# Patient Record
Sex: Female | Born: 1950 | Race: White | Hispanic: No | Smoking: Former smoker
Health system: Southern US, Community
[De-identification: ages and names within clinical notes are randomized; demographics above are authoritative.]

## PROBLEM LIST (undated history)

## (undated) DIAGNOSIS — C50919 Malignant neoplasm of unspecified site of unspecified female breast: Secondary | ICD-10-CM

## (undated) DIAGNOSIS — S22000A Wedge compression fracture of unspecified thoracic vertebra, initial encounter for closed fracture: Secondary | ICD-10-CM

## (undated) DIAGNOSIS — C801 Malignant (primary) neoplasm, unspecified: Secondary | ICD-10-CM

## (undated) DIAGNOSIS — I739 Peripheral vascular disease, unspecified: Secondary | ICD-10-CM

## (undated) DIAGNOSIS — E7800 Pure hypercholesterolemia, unspecified: Secondary | ICD-10-CM

## (undated) DIAGNOSIS — M1990 Unspecified osteoarthritis, unspecified site: Secondary | ICD-10-CM

## (undated) DIAGNOSIS — G4733 Obstructive sleep apnea (adult) (pediatric): Secondary | ICD-10-CM

## (undated) DIAGNOSIS — I209 Angina pectoris, unspecified: Secondary | ICD-10-CM

## (undated) DIAGNOSIS — K219 Gastro-esophageal reflux disease without esophagitis: Secondary | ICD-10-CM

## (undated) DIAGNOSIS — I2510 Atherosclerotic heart disease of native coronary artery without angina pectoris: Secondary | ICD-10-CM

## (undated) DIAGNOSIS — G620 Drug-induced polyneuropathy: Secondary | ICD-10-CM

## (undated) DIAGNOSIS — I779 Disorder of arteries and arterioles, unspecified: Secondary | ICD-10-CM

## (undated) DIAGNOSIS — J439 Emphysema, unspecified: Secondary | ICD-10-CM

## (undated) DIAGNOSIS — I10 Essential (primary) hypertension: Secondary | ICD-10-CM

## (undated) DIAGNOSIS — Z9989 Dependence on other enabling machines and devices: Secondary | ICD-10-CM

## (undated) HISTORY — PX: CARDIAC SURGERY: SHX584

## (undated) HISTORY — DX: Peripheral vascular disease, unspecified: I73.9

## (undated) HISTORY — PX: THYROID SURGERY: SHX805

## (undated) HISTORY — PX: APPENDECTOMY: SHX54

## (undated) HISTORY — PX: BREAST SURGERY: SHX581

## (undated) HISTORY — DX: Disorder of arteries and arterioles, unspecified: I77.9

## (undated) HISTORY — PX: CHOLECYSTECTOMY: SHX55

## (undated) HISTORY — PX: TUBAL LIGATION: SHX77

---

## 2000-11-07 ENCOUNTER — Encounter: Payer: Self-pay | Admitting: Emergency Medicine

## 2000-11-08 ENCOUNTER — Inpatient Hospital Stay (HOSPITAL_COMMUNITY): Admission: EM | Admit: 2000-11-08 | Discharge: 2000-11-09 | Payer: Self-pay | Admitting: Emergency Medicine

## 2003-04-13 DIAGNOSIS — C50919 Malignant neoplasm of unspecified site of unspecified female breast: Secondary | ICD-10-CM

## 2003-04-13 HISTORY — DX: Malignant neoplasm of unspecified site of unspecified female breast: C50.919

## 2003-04-13 HISTORY — PX: MASTECTOMY: SHX3

## 2003-10-11 DIAGNOSIS — C801 Malignant (primary) neoplasm, unspecified: Secondary | ICD-10-CM

## 2003-10-11 HISTORY — DX: Malignant (primary) neoplasm, unspecified: C80.1

## 2003-10-25 ENCOUNTER — Other Ambulatory Visit: Payer: Self-pay

## 2003-11-20 ENCOUNTER — Other Ambulatory Visit: Payer: Self-pay

## 2004-01-11 ENCOUNTER — Ambulatory Visit: Payer: Self-pay | Admitting: Oncology

## 2004-02-11 ENCOUNTER — Ambulatory Visit: Payer: Self-pay | Admitting: Oncology

## 2004-03-12 ENCOUNTER — Ambulatory Visit: Payer: Self-pay | Admitting: Oncology

## 2004-04-12 ENCOUNTER — Ambulatory Visit: Payer: Self-pay | Admitting: Oncology

## 2004-05-13 ENCOUNTER — Ambulatory Visit: Payer: Self-pay | Admitting: Oncology

## 2004-06-10 ENCOUNTER — Ambulatory Visit: Payer: Self-pay | Admitting: Oncology

## 2004-07-11 ENCOUNTER — Ambulatory Visit: Payer: Self-pay | Admitting: Oncology

## 2004-09-14 ENCOUNTER — Ambulatory Visit: Payer: Self-pay | Admitting: Oncology

## 2004-10-10 ENCOUNTER — Ambulatory Visit: Payer: Self-pay | Admitting: Oncology

## 2004-12-02 ENCOUNTER — Ambulatory Visit: Payer: Self-pay | Admitting: Oncology

## 2004-12-09 ENCOUNTER — Ambulatory Visit: Payer: Self-pay | Admitting: Oncology

## 2004-12-11 ENCOUNTER — Ambulatory Visit: Payer: Self-pay | Admitting: Oncology

## 2005-01-10 ENCOUNTER — Ambulatory Visit: Payer: Self-pay | Admitting: Oncology

## 2005-03-09 ENCOUNTER — Ambulatory Visit: Payer: Self-pay | Admitting: Oncology

## 2005-03-12 ENCOUNTER — Ambulatory Visit: Payer: Self-pay | Admitting: Oncology

## 2005-03-25 ENCOUNTER — Ambulatory Visit: Payer: Self-pay | Admitting: Otolaryngology

## 2005-04-06 ENCOUNTER — Encounter: Payer: Self-pay | Admitting: Otolaryngology

## 2005-04-15 ENCOUNTER — Ambulatory Visit: Payer: Self-pay | Admitting: Otolaryngology

## 2005-06-07 ENCOUNTER — Ambulatory Visit: Payer: Self-pay | Admitting: Oncology

## 2005-06-10 ENCOUNTER — Ambulatory Visit: Payer: Self-pay | Admitting: Oncology

## 2005-09-09 ENCOUNTER — Ambulatory Visit: Payer: Self-pay | Admitting: Oncology

## 2005-12-09 ENCOUNTER — Ambulatory Visit: Payer: Self-pay | Admitting: Oncology

## 2005-12-16 ENCOUNTER — Ambulatory Visit: Payer: Self-pay | Admitting: Oncology

## 2006-01-10 ENCOUNTER — Ambulatory Visit: Payer: Self-pay | Admitting: Oncology

## 2006-03-18 ENCOUNTER — Ambulatory Visit: Payer: Self-pay | Admitting: Oncology

## 2006-04-14 ENCOUNTER — Ambulatory Visit: Payer: Self-pay | Admitting: Oncology

## 2006-05-13 ENCOUNTER — Ambulatory Visit: Payer: Self-pay | Admitting: Oncology

## 2006-08-11 ENCOUNTER — Ambulatory Visit: Payer: Self-pay | Admitting: Oncology

## 2006-09-11 ENCOUNTER — Ambulatory Visit: Payer: Self-pay | Admitting: Oncology

## 2006-12-08 ENCOUNTER — Ambulatory Visit: Payer: Self-pay | Admitting: Surgery

## 2006-12-08 ENCOUNTER — Other Ambulatory Visit: Payer: Self-pay

## 2006-12-12 ENCOUNTER — Ambulatory Visit: Payer: Self-pay | Admitting: Oncology

## 2006-12-15 ENCOUNTER — Ambulatory Visit: Payer: Self-pay | Admitting: Surgery

## 2006-12-29 ENCOUNTER — Ambulatory Visit: Payer: Self-pay | Admitting: Oncology

## 2007-01-11 ENCOUNTER — Ambulatory Visit: Payer: Self-pay | Admitting: Oncology

## 2007-04-13 HISTORY — PX: CORONARY ANGIOPLASTY: SHX604

## 2007-05-14 ENCOUNTER — Ambulatory Visit: Payer: Self-pay | Admitting: Oncology

## 2007-06-11 ENCOUNTER — Ambulatory Visit: Payer: Self-pay | Admitting: Oncology

## 2007-07-12 ENCOUNTER — Ambulatory Visit: Payer: Self-pay | Admitting: Oncology

## 2007-09-11 ENCOUNTER — Ambulatory Visit: Payer: Self-pay | Admitting: Oncology

## 2007-09-21 ENCOUNTER — Ambulatory Visit: Payer: Self-pay | Admitting: Oncology

## 2007-10-05 ENCOUNTER — Ambulatory Visit: Payer: Self-pay | Admitting: Oncology

## 2007-10-19 ENCOUNTER — Inpatient Hospital Stay (HOSPITAL_COMMUNITY): Admission: RE | Admit: 2007-10-19 | Discharge: 2007-10-21 | Payer: Self-pay | Admitting: *Deleted

## 2007-11-11 ENCOUNTER — Ambulatory Visit: Payer: Self-pay | Admitting: Oncology

## 2007-11-30 ENCOUNTER — Ambulatory Visit: Payer: Self-pay | Admitting: Oncology

## 2007-12-12 ENCOUNTER — Ambulatory Visit: Payer: Self-pay | Admitting: Oncology

## 2008-04-12 HISTORY — PX: CARDIAC CATHETERIZATION: SHX172

## 2008-05-13 ENCOUNTER — Ambulatory Visit: Payer: Self-pay | Admitting: Oncology

## 2008-05-27 ENCOUNTER — Ambulatory Visit: Payer: Self-pay | Admitting: Oncology

## 2008-06-10 ENCOUNTER — Ambulatory Visit: Payer: Self-pay | Admitting: Oncology

## 2008-09-23 ENCOUNTER — Ambulatory Visit: Payer: Self-pay | Admitting: Oncology

## 2008-11-10 ENCOUNTER — Ambulatory Visit: Payer: Self-pay | Admitting: Oncology

## 2008-11-25 ENCOUNTER — Ambulatory Visit: Payer: Self-pay | Admitting: Oncology

## 2008-12-11 ENCOUNTER — Ambulatory Visit: Payer: Self-pay | Admitting: Oncology

## 2009-02-05 ENCOUNTER — Inpatient Hospital Stay (HOSPITAL_BASED_OUTPATIENT_CLINIC_OR_DEPARTMENT_OTHER): Admission: RE | Admit: 2009-02-05 | Discharge: 2009-02-05 | Payer: Self-pay | Admitting: Cardiovascular Disease

## 2009-05-13 ENCOUNTER — Ambulatory Visit: Payer: Self-pay | Admitting: Oncology

## 2009-05-26 DIAGNOSIS — I2510 Atherosclerotic heart disease of native coronary artery without angina pectoris: Secondary | ICD-10-CM | POA: Insufficient documentation

## 2009-06-02 ENCOUNTER — Ambulatory Visit: Payer: Self-pay | Admitting: Oncology

## 2009-06-10 ENCOUNTER — Ambulatory Visit: Payer: Self-pay | Admitting: Oncology

## 2009-07-21 DIAGNOSIS — B351 Tinea unguium: Secondary | ICD-10-CM | POA: Insufficient documentation

## 2009-08-25 DIAGNOSIS — J4489 Other specified chronic obstructive pulmonary disease: Secondary | ICD-10-CM | POA: Insufficient documentation

## 2009-08-26 ENCOUNTER — Encounter: Admission: RE | Admit: 2009-08-26 | Discharge: 2009-08-26 | Payer: Self-pay | Admitting: Family Medicine

## 2009-09-29 ENCOUNTER — Ambulatory Visit: Payer: Self-pay | Admitting: Oncology

## 2009-10-20 HISTORY — PX: OTHER SURGICAL HISTORY: SHX169

## 2009-11-07 ENCOUNTER — Encounter: Payer: Self-pay | Admitting: Oncology

## 2009-11-10 ENCOUNTER — Encounter: Payer: Self-pay | Admitting: Oncology

## 2009-12-11 ENCOUNTER — Encounter: Payer: Self-pay | Admitting: Oncology

## 2010-01-10 ENCOUNTER — Encounter: Payer: Self-pay | Admitting: Oncology

## 2010-06-19 ENCOUNTER — Inpatient Hospital Stay (INDEPENDENT_AMBULATORY_CARE_PROVIDER_SITE_OTHER)
Admission: RE | Admit: 2010-06-19 | Discharge: 2010-06-19 | Disposition: A | Discharge status: Home / Self Care | Payer: Worker's Compensation | Source: Ambulatory Visit | Attending: Emergency Medicine | Admitting: Emergency Medicine

## 2010-06-19 ENCOUNTER — Ambulatory Visit (INDEPENDENT_AMBULATORY_CARE_PROVIDER_SITE_OTHER): Payer: Worker's Compensation

## 2010-06-19 DIAGNOSIS — S6720XA Crushing injury of unspecified hand, initial encounter: Secondary | ICD-10-CM

## 2010-07-02 HISTORY — PX: NM MYOCAR PERF WALL MOTION: HXRAD629

## 2010-07-16 LAB — POCT I-STAT GLUCOSE
Glucose, Bld: 174 mg/dL — ABNORMAL HIGH (ref 70–99)
Operator id: 221371

## 2010-08-25 NOTE — Cardiovascular Report (Signed)
NAME:  Michaela Wilcox, FUCHS NO.:  [id number]   MEDICAL RECORD NO.:  [id number]          PATIENT TYPE:  OIB   LOCATION:  6532                         FACILITY:  [facility]   PHYSICIAN:  Peter M. Swaziland, M.D.  DATE OF BIRTH:  [birth year]   DATE OF PROCEDURE:  DATE OF DISCHARGE:                            CARDIAC CATHETERIZATION   INDICATIONS FOR PROCEDURE:  A 60 year old white female who presents with  progressive symptoms of angina.  Diagnostic cardiac catheterization  demonstrated three-vessel obstructive coronary disease.  She has severe  90% stenosis in the LAD with a 70% to 80% stenosis in the mid circumflex  and diffuse 70% stenosis involving the proximal right coronary artery.  After weighing her options, the patient elected to pursue catheter-based  intervention.   PROCEDURE:  Intracoronary stenting of the mid LAD, and of the mid left  circumflex coronary.  Access via the right femoral artery using standard  Seldinger technique.   EQUIPMENT USED:  6-French arterial sheath, 6-French FL-4 guide, a 0.014  Asahi medium wire, a 2.5 x 15-mm Sprinter Legend balloon, a 2.75 x 18-mm  Promus stent, 2.75 x 15-mm [place] Sprinter noncompliant balloon, 2.25 x 12-  mm Taxus Liberte Atom stent and a 2.25 x 8-mm Quantum Maverick balloon.   MEDICATIONS:  Versed 2 mg IV, fentanyl 25 mcg IV, nitroglycerin 200 mcg  intracoronary x3, Angiomax bolus at 0.75 mg/kg followed by continuous  infusion 1.75 mg/kg per hour.  ACT following this was 526 seconds.  Angiomax was discontinued at the end of the procedure.  The patient was  hemodynamically stable throughout the procedure.  She did have mild  chest pain with balloon inflations.   INTERVENTIONAL NOTE:  After initial guide shots were obtained, we  proceeded with stenting of the mid left anterior descending artery.  This was easily crossed with a wire.  We predilated the lesion using a  2.5 x 15-mm Sprinter Legend balloon up to 6 and then 8  atmospheres.  We  next placed a 2.75 x 80-mm Promus stent across the lesion.  This was  deployed at 9 and then 11 atmospheres with a stent balloon.  We then  postdilated using a 2.75 x 15-mm [place] Sprinter balloon up to 14  atmospheres x2.  This yielded an excellent angiographic result with 0%  residual stenosis and TIMI grade III flow.   We next turned our attention to the mid left circumflex coronary using  the same guide and guide wire.  We were able to cross this lesion  without difficulty.  We primarily stented this lesion with 2.25 x 12-mm  Taxus Atom stent deploying this at 9 and then 12 atmospheres with the  stent balloon.  It was postdilated using a 2.25 x 8-mm Quantum Maverick  balloon up to 16 atmospheres x2.  This yielded an excellent angiographic  result with 0% residual stenosis and TIMI grade III flow.   FINAL INTERPRETATION:  Successful stenting of the mid left anterior  descending artery and mid left circumflex coronary artery with drug-  eluting stents.   PLAN:  Continue aspirin and Plavix.  If the patient remained  symptomatic, we would consider intervention of the proximal right  coronary artery at a later date.           ______________________________  Peter M. Swaziland, M.D.     PMJ/MEDQ  D:  10/20/2007  T:  10/21/2007  Job:  [id number]   cc:   Elmore Guise., M.D.

## 2010-08-25 NOTE — H&P (Signed)
NAME:  Michaela, Wilcox                    ACCOUNT NO.:  [id number]   MEDICAL RECORD NO.:  [id number]        PATIENT TYPE:  COIB   LOCATION:                               FACILITY:  [facility]   PHYSICIAN:  Elmore Guise., M.D.DATE OF BIRTH:  08/09/1950   DATE OF ADMISSION:  10/19/2007  DATE OF DISCHARGE:                              HISTORY & PHYSICAL   INDICATION FOR PROCEDURE:  Continued exertional chest pain, shortness of  breath.   HISTORY OF PRESENT ILLNESS:  [name] is a very pleasant 60 year old  white female with past medical history of coronary vasospasm, breast  cancer, hypothyroidism, hypertension, dyslipidemia, tobacco dependence  who initially presented for chest pain on October 05, 2007.  She reported  at that time over the last 6 months, she was having an abnormal feeling  in her upper chest.  She first thought it was indigestion and secondary  to postnasal drip, however, her symptoms progressed, now having chest  pain in her upper chest with moderate activity.  It makes her very short  of breath.  She denies any nausea or diaphoresis.  She reports over the  last month or two, she has been having radiation to her neck and both  shoulders.  She has had no rest pain.  She would sit down and it will  go away.  She had similar episodes back in 2002.  At that time, she  underwent cardiac catheterization by Dr. Viann Fish.  This showed an  EF of 60% with normal left main, normal LAD, normal circumflex.  RCA had  significant vasospasm noted.  She has done rather well for the last 6-7  years until her spell started about 6 months ago.  She has been smoking  for 22 years and recently placed on Chantix, but reports that this  caused her to be depressed.  She does report that she has chronic left  pain from her mastectomy which radiates to her left arm and states that  this is hard to differentiate from her chest pain or her cancer pain.  She denies any significant fever, orthopnea,  PND.  She was placed on  long-acting Imdur starting at 30 mg and increasing it up to 60 mg.  She  reports she is 50-60% better.  She does continue to be very winded.  She  can walk from the parking lot to the office and will get very short of  breath.  Her chest pain has improved, but still occurs with any moderate  activity such as walking up a slight incline or with climbing stairs.  She will be for referred for cardiac catheterization.   CURRENT MEDICATIONS:  1. Pravachol 80 mg daily.  2. Boniva 150 mg monthly.  3. Calcium with vitamin D once daily.  4. Levothyroxine 100 mcg daily.  5. Lyrica 75 mg daily.  6. Vitamin D 1000 units daily.  7. Imdur 60 mg daily.  8. Aspirin 325 mg daily.   ALLERGIES:  NONE.   FAMILY HISTORY:  Positive for diabetes mellitus.  The patient was  adopted.  She does note that her father died from cancer.  Mother died  from complications of hip fracture surgery.   SOCIAL HISTORY:  She is divorced.  She works for the [employer].  Does not exercise.  Has 45 pack-year history of tobacco.  She drinks 4  cups of coffee and 2-3 bottles of green tea daily.   PHYSICAL EXAMINATION:  VITAL SIGNS:  Her weight is 201 pounds.  Her  blood pressure is 150/100, heart rate is 72 and regular.  GENERAL:  She is a very pleasant middle-aged white female alert and  oriented x4.  No acute distress.  HEENT:  Normal.  NECK:  Supple.  No lymphadenopathy, 2+ carotids.  No JVD and no bruits.  She does have an old scar at the bottom of her neck from her thyroid and  parathyroid surgery.  LUNGS:  Clear.  HEART:  Regular with normal S1-S2.  Soft flow murmur noted.  BREASTS:  She has had a left breast mastectomy.  ABDOMEN:  Soft, nontender, nondistended.  No rebound or guarding.  EXTREMITIES:  Warm, 2+ pulses and no edema.  She has 2+ pulses in her  brachial, radial and pedal.  Femoral pulses are 1+ and deep bilaterally.   DIAGNOSTICS:  Her EKG shows sinus bradycardia  with anterior T-wave  inversions V1-V3.  We have no old EKG for comparison.   IMPRESSION:  1. Continued exertional chest pain.  2. History of coronary vasospasm.  3. Dyslipidemia.  4. Hypertension.  5. History of breast cancer.   PLAN:  At this time, we will increase her Imdur.  She will continue 60  mg in the morning and 30 mg in the afternoon.  She will continue aspirin  and use sublingual nitroglycerin as needed.  I discussed risks, benefits  of cardiac catheterization versus stress testing.  We both agree with  her continued symptoms despite being on long-acting nitrates, that we  would refer her for catheterization.  She understands the risks and  benefits of this procedure, and agrees to proceed.  She is to call if  she has any worsening symptoms prior to her cath.  All her questions  were answered.      Elmore Guise., M.D.  Electronically Signed     TWK/MEDQ  D:  10/17/2007  T:  10/17/2007  Job:  [id number]

## 2010-08-28 NOTE — Cardiovascular Report (Signed)
[place]. [facility]  Patient:    Michaela Wilcox, Michaela Wilcox                           MRN: [id number] Proc. Date: 11/08/00 Adm. Date:  [id number] Attending:  Norman Clay CC:         Meindert A. Lacie Scotts, M.D., [facility]   Cardiac Catheterization  PROCEDURE:  Cardiac catheterization.  HISTORY:  A 60 year old female with cigarette abuse, hyperlipidemia, history of gastroesophageal reflux disease, who presented with a prolonged episode of chest pain relieved with nitroglycerin.  The pain had increased in frequency and severity, and she had a Cardiolite scan done recently.  The patient tolerated the procedure well without complications.  At the end of the procedure, the patient had good hemostasis and pedal pulses present.  CARDIOLOGIST:  Darden Palmer., M.D.  HEMODYNAMIC DATA:  Aorta post contrast 145/76, LV post contrast 145/15.  ANGIOGRAPHIC DATA:  Left ventriculogram:  Performed in the 30 degree RAO projection.  The aortic and mitral valves appear normal.  The left ventricle is normal in size.  There was normal wall motion and normal ejection fraction of 60%.  Coronary arteries arise and distribute normally.  The left main coronary artery had significant ventricular motion and damping with a 6-French JL4 catheter.  With a 5-French JL3.5 catheter, there wss no significant ventricularization or damping.  There did not appear to be angiographic narrowing through the left main coronary artery noted.  The vessel did appear to be somewhat small, however.  The left anterior descending was a large vessel proximally without significant stenosis.  There was irregularity in the mid and distal portions.  The circumflex had proximal irregularity but no severe obstructive stenoses noted.  The right coronary artery had significant ventricularization and damping with a JR4 6-French catheter.  A right bypass catheter gave better visualization, but  there appeared to be an obstructive stenosis proximally.  After the left coronary injections, we went back with a 6-French JR3.5 catheter which had no ventricularization or damping and showed the vessel to be widely patent.  There was mild irregularity proximally suggesting the initial stenoses seen were spasm related.  IMPRESSION: 1. Normal left ventricular function. 2. Mild irregularities involving the left anterior descending coronary artery. 3. Coronary spasm, catheter induced, involving the right coronary artery    proximally.  There is mild disease in the mid right coronary artery also. 4. The left main coronary artery had ventricularization with the 6-French    catheter but not the 5-French catheter and did not appear to have any    angiographic obstructions. DD:  11/08/00 TD:  11/08/00 Job: [id number]

## 2010-08-28 NOTE — Discharge Summary (Signed)
NAME:  Michaela Wilcox, Michaela Wilcox                    ACCOUNT NO.:  [id number]   MEDICAL RECORD NO.:  [id number]          PATIENT TYPE:  OIB   LOCATION:  6532                         FACILITY:  [facility]   PHYSICIAN:  Elmore Guise., M.D.DATE OF BIRTH:  04-15-1950   DATE OF ADMISSION:  10/19/2007  DATE OF DISCHARGE:  10/21/2007                               DISCHARGE SUMMARY   DISCHARGE DIAGNOSES:  1. Coronary artery disease (status post successful PCI to mid LAD and      mid circumflex).  2. Dyslipidemia.  3. Hypothyroidism.  4. Breast cancer  5. Tobacco dependence.   HISTORY OF PRESENT ILLNESS:  Michaela Wilcox is a 60 year old who presented  with increasing exertional angina.  She was referred for cardiac  catheterization.   HOSPITAL COURSE:  The patient's hospital course was uncomplicated.  She  underwent diagnostic catheterization on October 19, 2007.  This showed focal  stenosis in her LAD, circumflex, and RCA.  After her procedure a long  discussion was had regarding PCI versus surgical referral.  The patient  was elected for PCI.  Was very hesitant to have surgical  revascularizations.  She was referred to Dr. Swaziland.  He discussed the  risks, benefits of this procedure with her at length.  She then  underwent successful PCI on October 20, 2007.  Her postprocedure course was  uncomplicated.  She did have a vagal reaction after her sheath was  pulled which responded nicely to normal saline bolus.  She was noted to  have a very small hematoma, but this resolved with direct pressure.  She  has now been up and ambulatory.  She has had no further chest pain or  shortness of breath.  She will be discharged home today to continue the  following medications:   DISCHARGE MEDICATIONS:  1. Lipitor 40 mg daily.  2. Boniva 150 mg monthly.  3. Calcium with vitamin D once daily.  4. Synthroid 100 mcg daily.  5. Lyrica 75 mg daily.  6. Vitamin D 1000 units daily.  7. Aspirin 325 mg daily.  8. Sublingual  nitroglycerin as needed for chest pain.  9. Plavix 75 mg daily.  10.Percocet 5/325 mg 1-2 as needed for pain.  11.She did become bradycardic and hypotensive with metoprolol.      Therefore metoprolol will not be given at discharge.  She should      not need her Imdur at this point.   FOLLOWUP:  She will follow up with Dr. Reyes Ivan in one week.  She should  have routine post cath restrictions.   ACTIVITY:  She is to increase her activity slowly.      Elmore Guise., M.D.  Electronically Signed     TWK/MEDQ  D:  10/23/2007  T:  10/23/2007  Job:  [id number]

## 2010-08-28 NOTE — Discharge Summary (Signed)
[place]. [facility]  Patient:    Michaela Wilcox, Michaela Wilcox                           MRN: [id number] Adm. Date:  [id number] Disc. Date: [id number] Attending:  Norman Clay CC:         Meindert A. Lacie Scotts, M.D., [place], [place]   Discharge Summary  FINAL DIAGNOSES: 1. Chest pain, prolonged, of uncertain etiology, myocardial infarction ruled    out.    a. Catheterization showing mild coronary artery disease.  No severe       obstructive stenoses noted. 2. Hyperlipidemia inadequately treated at the present time. 3. History of esophageal ulcers. 4. Dizziness of uncertain etiology. 5. Obesity. 6. Cigarette abuse.  HISTORY OF PRESENT ILLNESS:  This 60 year old female has a longstanding history of cigarette abuse and hyperlipidemia.  She has a six-month history of chest tightness and fluttering and also has symptoms of dizziness and dyspnea. She has had progressive symptoms sometimes with exertion and sometimes not which gradually limited her activities.  She has gained weight.  She had an echocardiogram done at through Dr. Chyrl Civatte A. Niemeyers referral at [facility] office and was told that she had ventricular hypertrophy.  She had a stress Cardiolite scan done several days prior to admission by Dr. Welton Flakes at [place], [place].  The scan was faxed over to our office and showed what was felt to be breast attenuation without ischemia.  She presented to the emergency room with anterior chest pain and pressure lasting around 30 minutes associated with shortness of breath and sweating.  She felt as if there was a hand pressing on her chest.  EMS noted that the pain lasted two hours.  This was accompanied with shortness of breath and nausea.  She also complained of dizziness at the time she was having chest pain and described the dizziness as unsteadiness on her feet rather than vertigo.  She was complaining of dizziness the morning after she was  admitted.  Please see the previously dictated history and physical for the remainder of the details.  LABORATORY DATA ON ADMISSION:  Negative serial CK-MB.  The EKG remained normal.  The glucose was 117, which was slightly elevated.  The CBC was normal.  The lipid panel showed a cholesterol of 240, triglycerides 253, HDL 40, and LDL cholesterol 149.  The TSH was 1.413.  HOSPITAL COURSE:  The patient was brought into the hospital with a prolonged episode of chest discomfort.  She underwent cardiac catheterization the next afternoon.  The right coronary artery contained mild irregularities.  There was thought to be some catheter-induced spasm that resolved spontaneously. The left main coronary artery had to be imaged with 5 French catheters.  It appeared to be somewhat small, but did not contain severe obstructive disease angiographically.  There were irregularities in the LAD and the circumflex noted.  The results were discussed with the patient.  After review of the non-invasive data from Dr. Hulan Fess office, it was felt that she should be treated medically.  She day of discharge have elevated LDL cholesterol and low HDL cholesterol at 40 for a woman and it is recommended that she have intensive lipid-lowering management to lower the LDL to less than 100.  The importance of discontinuation of cigarette smoker was discussed with her, as well as specific plan means of ways to do so.  She will be treated with sustained  release diltiazem 240 mg daily to prevent esophageal spasm or coronary spasms as she is at risk of this with cigarette smoking.  DISCHARGE MEDICATIONS:  She is discharged at this time on her previous dose of Pravachol, Prevacid 30 mg daily, sustained released diltiazem 240 mg daily, and baby aspirin daily.  She was also given a prescription for nitroglycerin to use for symptoms.  FOLLOW-UP:  She is to follow up with me in two weeks.  It is recommended that she have a  gastroenterologic evaluation. DD:  11/09/00 TD:  11/09/00 Job: [id number]

## 2010-08-28 NOTE — H&P (Signed)
[place]. [facility]  Patient:    Michaela Wilcox, Michaela Wilcox                           MRN: [id number] Adm. Date:  [id number] Attending:  Cathren Laine CC:         Meindert A. Lacie Scotts, M.D., [place]   History and Physical  CHIEF COMPLAINT:  Chest pain.  HISTORY:  The patient is a 60 year old female with a longstanding history of cigarette abuse and hyperlipidemia.  She has had a six to eight-month history of chest tightness and fluttering and also symptoms of dizziness and shortness of breath.  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  She saw her family doctor around one week ago and had an echocardiogram and was told that she had an enlarged heart.  She had a stress Cardiolite scan by Dr. Welton Flakes in [place] around five days ago but has not received the results of it.  This evening, she developed dizziness described as an unsteadiness on her feet, felt warm and hot, and then developed anterior chest pain and pressure that she says lasted around 30 minutes.  The discomfort was associated with shortness of breath, sweating, and felt as if there was a hand pressing on her chest.  It was not pleuritic and radiated somewhat to the left arm.  The EMS note noted the pain lasted two hours and was described as shortness of breath and nausea.  There was no vomiting, and she had some mild diaphoresis.  She was given 4 aspirin and then given nitroglycerin with some partial relief of her pain, and then a second nitroglycerin with relief of the pain.  She was transported to [facility].  An initial ECG was unremarkable, and she is admitted at this time to rule out an MI.  PAST MEDICAL HISTORY:  Remarkable for hyperlipidemia treated with Pravachol. She has a history of esophageal ulcers treated with Prevacid but notes these symptoms are different from the  previous ones.  There was no history of hypertension or diabetes.  PAST SURGICAL HISTORY:  Breast biopsy, removal of cyst, appendectomy, previous endoscopy.  ALLERGIES:  None.  CURRENT MEDICATIONS:  Pravachol daily and Prevacid daily.  Aspirin daily.  FAMILY HISTORY:  She is adopted and does not know her immediate family history.  SOCIAL HISTORY:  She is divorced 22 years ago and has one son, 33, by previous marriage.  She smokes two packs of cigarettes per day, does not use alcohol to excess.  She does not attend church regularly.  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  REVIEW OF SYSTEMS:  She has been obese for several years, formerly worked out until chest pain and dizziness started.  She complains of dizziness described more as unsteadiness on her feet and not vertigo.  She does not have any pleuritic chest pain, denies PND or orthopnea.  She does have the discomfort described as reflux when she would swallow or eat things that is relieved with Prevacid.  She describes it more as a burning pain and different from the pain that she had tonight.  No claudication or TIAs.  Remainder of Review of Systems is unremarkable.  PHYSICAL EXAMINATION:  GENERAL:  She is a pale, obese female who appears older  than her stated age of 89.  VITAL SIGNS:  Blood pressure 130/85, pulse 76.  SKIN:  Warm and dry.  HEENT:  EOMI.  PERRLA.  Conjunctivae and sclerae clear.  NECK:  There is no JVD, thyromegaly, or bruits.  LUNGS:  Clear to auscultation and percussion.  CARDIAC:  Normal S1 and S2, no S3.  ABDOMEN:  Soft and nontender.  EXTREMITIES:  Femoral and distal pulses present and 2+.  No edema noted.  NEUROLOGIC:  Normal.  LABORATORY DATA:  A 12-lead ECG is normal.  Lab data shows normal CBC.  Potassium 3.5, glucose 117.  Initial CPK and troponin were negative.  IMPRESSION: 1. Chest pain consistent with  unstable angina pectoris. 2. Recent cardiac workup yet unknown.  Will try to obtain details tomorrow. 3. Hyperlipidemia under treatment. 4. History of gastroesophageal reflux. 5. Tobacco abuse.  RECOMMENDATIONS:  Admit to telemetry bed.  Begin intravenous heparin, beta blockers, and aspirin.  Check serial enzymes.  The patient, because of her suggestive history, will need to undergo cardiac catheterization to exclude coronary artery disease.  We will attempt to obtain records of the echocardiogram and Cardiolite done recently.  Even if the Cardiolite is negative, the acute presentation tonight would necessitate a cardiac catheterization for diagnosis.  The catheterization procedure was discussed with the patient and her son fully including risks of MI, death, or CVA; in addition, risks of angioplasty and stenting were discussed with the patient, and they are willing to proceed. DD:  11/08/00 TD:  11/08/00 Job: [id number]

## 2010-10-01 ENCOUNTER — Ambulatory Visit: Payer: Self-pay | Admitting: Family Medicine

## 2011-01-07 LAB — CBC
HCT: 37.2
HCT: 43.1
Hemoglobin: 12.8
MCHC: 34.3
MCV: 95.7
RBC: 3.91
RBC: 4.5
RDW: 13.6
WBC: 10.5

## 2011-01-07 LAB — BASIC METABOLIC PANEL
BUN: 11
CO2: 25
CO2: 27
Calcium: 9.6
Chloride: 103
Creatinine, Ser: 0.82
GFR calc Af Amer: >60
GFR calc non Af Amer: >60
GFR calc non Af Amer: >60
Glucose, Bld: 170 — ABNORMAL HIGH
Glucose, Bld: 287 — ABNORMAL HIGH
Potassium: 3.9
Potassium: 4
Sodium: 134 — ABNORMAL LOW
Sodium: 139

## 2011-03-30 ENCOUNTER — Emergency Department (HOSPITAL_COMMUNITY)
Admission: EM | Admit: 2011-03-30 | Discharge: 2011-03-30 | Disposition: A | Discharge status: Home / Self Care | Payer: 59 | Attending: Emergency Medicine | Admitting: Emergency Medicine

## 2011-03-30 ENCOUNTER — Other Ambulatory Visit: Payer: Self-pay

## 2011-03-30 ENCOUNTER — Encounter: Payer: Self-pay | Admitting: *Deleted

## 2011-03-30 ENCOUNTER — Emergency Department (HOSPITAL_COMMUNITY): Payer: 59

## 2011-03-30 DIAGNOSIS — E7800 Pure hypercholesterolemia, unspecified: Secondary | ICD-10-CM | POA: Insufficient documentation

## 2011-03-30 DIAGNOSIS — Z7982 Long term (current) use of aspirin: Secondary | ICD-10-CM | POA: Insufficient documentation

## 2011-03-30 DIAGNOSIS — I2510 Atherosclerotic heart disease of native coronary artery without angina pectoris: Secondary | ICD-10-CM | POA: Insufficient documentation

## 2011-03-30 DIAGNOSIS — Z859 Personal history of malignant neoplasm, unspecified: Secondary | ICD-10-CM | POA: Insufficient documentation

## 2011-03-30 DIAGNOSIS — F17200 Nicotine dependence, unspecified, uncomplicated: Secondary | ICD-10-CM | POA: Insufficient documentation

## 2011-03-30 DIAGNOSIS — R110 Nausea: Secondary | ICD-10-CM | POA: Insufficient documentation

## 2011-03-30 DIAGNOSIS — I10 Essential (primary) hypertension: Secondary | ICD-10-CM | POA: Insufficient documentation

## 2011-03-30 DIAGNOSIS — D72829 Elevated white blood cell count, unspecified: Secondary | ICD-10-CM | POA: Insufficient documentation

## 2011-03-30 DIAGNOSIS — R1013 Epigastric pain: Secondary | ICD-10-CM | POA: Insufficient documentation

## 2011-03-30 DIAGNOSIS — I491 Atrial premature depolarization: Secondary | ICD-10-CM | POA: Insufficient documentation

## 2011-03-30 HISTORY — DX: Atherosclerotic heart disease of native coronary artery without angina pectoris: I25.10

## 2011-03-30 HISTORY — DX: Pure hypercholesterolemia, unspecified: E78.00

## 2011-03-30 HISTORY — DX: Malignant (primary) neoplasm, unspecified: C80.1

## 2011-03-30 HISTORY — DX: Essential (primary) hypertension: I10

## 2011-03-30 LAB — BASIC METABOLIC PANEL
BUN: 11 mg/dL (ref 6–23)
Calcium: 10.5 mg/dL (ref 8.4–10.5)
Creatinine, Ser: 0.71 mg/dL (ref 0.50–1.10)
GFR calc Af Amer: >90 mL/min (ref >90)
GFR calc non Af Amer: >90 mL/min (ref >90)
Glucose, Bld: 100 mg/dL — ABNORMAL HIGH (ref 70–99)
Potassium: 4 mEq/L (ref 3.5–5.1)

## 2011-03-30 LAB — DIFFERENTIAL
Basophils Relative: 0 % (ref 0–1)
Eosinophils Absolute: 0.1 10*3/uL (ref 0.0–0.7)
Eosinophils Relative: 1 % (ref 0–5)
Monocytes Relative: 4 % (ref 3–12)
Neutrophils Relative %: 78 % — ABNORMAL HIGH (ref 43–77)

## 2011-03-30 LAB — CBC
Hemoglobin: 14.2 g/dL (ref 12.0–15.0)
MCH: 32.3 pg (ref 26.0–34.0)
MCHC: 33.5 g/dL (ref 30.0–36.0)
MCV: 96.6 fL (ref 78.0–100.0)

## 2011-03-30 LAB — URINALYSIS, ROUTINE W REFLEX MICROSCOPIC
Bilirubin Urine: NEGATIVE
Hgb urine dipstick: NEGATIVE
Ketones, ur: 15 mg/dL — AB
Nitrite: NEGATIVE
Protein, ur: NEGATIVE mg/dL
Specific Gravity, Urine: 1.015 (ref 1.005–1.030)
Urobilinogen, UA: 0.2 mg/dL (ref 0.0–1.0)

## 2011-03-30 MED ORDER — SODIUM CHLORIDE 0.9 % IV BOLUS (SEPSIS)
1000.0000 mL | Freq: Once | INTRAVENOUS | Status: AC
  Administered 2011-03-30: 1000 mL via INTRAVENOUS

## 2011-03-30 MED ORDER — PROMETHAZINE HCL 25 MG PO TABS: 25.0000 mg | ORAL_TABLET | Freq: Four times a day (QID) | ORAL | Status: AC | PRN

## 2011-03-30 MED ORDER — ONDANSETRON HCL 4 MG/2ML IJ SOLN
4.0000 mg | Freq: Once | INTRAMUSCULAR | Status: AC
  Administered 2011-03-30: 4 mg via INTRAVENOUS
  Filled 2011-03-30: qty 2

## 2011-03-30 MED ORDER — MORPHINE SULFATE 2 MG/ML IJ SOLN
2.0000 mg | Freq: Once | INTRAMUSCULAR | Status: AC
  Administered 2011-03-30: 2 mg via INTRAVENOUS
  Filled 2011-03-30: qty 1

## 2011-03-30 NOTE — ED Notes (Signed)
Pt c/o pain in her abdomen since 0700. States that it is moving into her chest. Denies nausea, vomiting, diarrhea, urinary symptoms or fever.

## 2011-03-30 NOTE — ED Notes (Signed)
Pt reporting cramping pain across upper portion of abdomen.  Reporting mild nausea, but states it has improved somewhat.  IV infusing with no difficulty.  Site benign.

## 2011-03-30 NOTE — ED Notes (Signed)
Pt also c/o numbness in her left hand. 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

## 2011-03-30 NOTE — ED Provider Notes (Signed)
History  Scribed for [employer]. Colon Branch, MD, the patient was seen in room APA06/APA06 . This chart was scribed by Lewanda Rife.   CSN: [id number] Arrival date & time: 03/30/2011  4:18 PM   First MD Initiated Contact with Patient 03/30/11 1623      Chief Complaint  Patient presents with  . Abdominal Pain    (Consider location/radiation/quality/duration/timing/severity/associated sxs/prior treatment) HPI Michaela Wilcox is a 60 y.o. female with a h/o CAD, HTN, Diabetes who presents to the Emergency Department complaining of epigastric pain that began at  7  this morning. Pain is located in the upper abdomen with no radiation. It is a intermittent sharp grabbing pain that lasts several minutes as a time and is associated  with nausea.Pt denies vomiting, diarrhea and has not has a bowel movement since yesterday.  PCP Lacie Scotts Past Medical History  Diagnosis Date  . Coronary artery disease   . Hypertension   . High cholesterol   . Cancer   . Diabetes mellitus     Past Surgical History  Procedure Date  . Breast surgery   . Cardiac surgery     History reviewed. No pertinent family history.  History  Substance Use Topics  . Smoking status: Current Everyday Smoker -- 1.0 packs/day    Types: Cigarettes  . Smokeless tobacco: Not on file  . Alcohol Use: No    OB History    Grav Para Term Preterm Abortions TAB SAB Ect Mult Living                  Review of Systems  Gastrointestinal: Positive for abdominal pain (epigastric pain). Negative for nausea, vomiting and diarrhea.  All other systems reviewed and are negative.    Allergies  Review of patient's allergies indicates no known allergies.  Home Medications  No current outpatient prescriptions on file.  Triage VS BP 130/68  Pulse 67  Temp(Src) 97.5 F (36.4 C) (Oral)  Resp 18  Ht 5\' 4"  (1.626 m)  Wt 196 lb (88.905 kg)  BMI 33.64 kg/m2  SpO2 98%  Physical Exam  Nursing note and vitals  reviewed. Constitutional: She is oriented to person, place, and time. She appears well-developed and well-nourished.  HENT:  Head: Normocephalic and atraumatic.  Mouth/Throat: Oropharynx is clear and moist.  Eyes: EOM are normal. Pupils are equal, round, and reactive to light.  Neck: Normal range of motion. Neck supple.  Cardiovascular: Normal rate, regular rhythm and intact distal pulses.  Exam reveals no gallop and no friction rub.   No murmur heard. Pulmonary/Chest: Effort normal. She has rales (crackles at the bases billaterally ).  Abdominal: Soft. There is tenderness.       Epigastric tenderness  Musculoskeletal: Normal range of motion.  Neurological: She is alert and oriented to person, place, and time.  Skin: Skin is warm and dry.       Very dry skin     ED Course  Procedures (including critical care time) Oxygen saturation is 98% on room air, normal by my interpretation.  Results for orders placed during the hospital encounter of 03/30/11  CBC      Component Value Range   WBC 12.8 (*) 4.0 - 10.5 (K/uL)   RBC 4.39  3.87 - 5.11 (MIL/uL)   Hemoglobin 14.2  12.0 - 15.0 (g/dL)   HCT 40.9  81.1 - 91.4 (%)   MCV 96.6  78.0 - 100.0 (fL)   MCH 32.3  26.0 - 34.0 (pg)   MCHC  33.5  30.0 - 36.0 (g/dL)   RDW 16.1  09.6 - 04.5 (%)   Platelets 260  150 - 400 (K/uL)  DIFFERENTIAL      Component Value Range   Neutrophils Relative 78 (*) 43 - 77 (%)   Neutro Abs 10.0 (*) 1.7 - 7.7 (K/uL)   Lymphocytes Relative 17  12 - 46 (%)   Lymphs Abs 2.1  0.7 - 4.0 (K/uL)   Monocytes Relative 4  3 - 12 (%)   Monocytes Absolute 0.5  0.1 - 1.0 (K/uL)   Eosinophils Relative 1  0 - 5 (%)   Eosinophils Absolute 0.1  0.0 - 0.7 (K/uL)   Basophils Relative 0  0 - 1 (%)   Basophils Absolute 0.0  0.0 - 0.1 (K/uL)  BASIC METABOLIC PANEL      Component Value Range   Sodium 136  135 - 145 (mEq/L)   Potassium 4.0  3.5 - 5.1 (mEq/L)   Chloride 98  96 - 112 (mEq/L)   CO2 29  19 - 32 (mEq/L)   Glucose,  Bld 100 (*) 70 - 99 (mg/dL)   BUN 11  6 - 23 (mg/dL)   Creatinine, Ser 4.09  0.50 - 1.10 (mg/dL)   Calcium 81.1  8.4 - 10.5 (mg/dL)   GFR calc non Af Amer >90  >90 (mL/min)   GFR calc Af Amer >90  >90 (mL/min)  URINALYSIS, ROUTINE W REFLEX MICROSCOPIC      Component Value Range   Color, Urine YELLOW  YELLOW    APPearance CLEAR  CLEAR    Specific Gravity, Urine 1.015  1.005 - 1.030    pH 7.5  5.0 - 8.0    Glucose, UA NEGATIVE  NEGATIVE (mg/dL)   Hgb urine dipstick NEGATIVE  NEGATIVE    Bilirubin Urine NEGATIVE  NEGATIVE    Ketones, ur 15 (*) NEGATIVE (mg/dL)   Protein, ur NEGATIVE  NEGATIVE (mg/dL)   Urobilinogen, UA 0.2  0.0 - 1.0 (mg/dL)   Nitrite NEGATIVE  NEGATIVE    Leukocytes, UA NEGATIVE  NEGATIVE    8:15 PM Discussed unremarkable labs and x-rays and plan for visit in the ED. Recommendations to use stool softer and Miramax.    Results for orders placed during the hospital encounter of 03/30/11  CBC      Component Value Range   WBC 12.8 (*) 4.0 - 10.5 (K/uL)   RBC 4.39  3.87 - 5.11 (MIL/uL)   Hemoglobin 14.2  12.0 - 15.0 (g/dL)   HCT 91.4  78.2 - 95.6 (%)   MCV 96.6  78.0 - 100.0 (fL)   MCH 32.3  26.0 - 34.0 (pg)   MCHC 33.5  30.0 - 36.0 (g/dL)   RDW 21.3  08.6 - 57.8 (%)   Platelets 260  150 - 400 (K/uL)  DIFFERENTIAL      Component Value Range   Neutrophils Relative 78 (*) 43 - 77 (%)   Neutro Abs 10.0 (*) 1.7 - 7.7 (K/uL)   Lymphocytes Relative 17  12 - 46 (%)   Lymphs Abs 2.1  0.7 - 4.0 (K/uL)   Monocytes Relative 4  3 - 12 (%)   Monocytes Absolute 0.5  0.1 - 1.0 (K/uL)   Eosinophils Relative 1  0 - 5 (%)   Eosinophils Absolute 0.1  0.0 - 0.7 (K/uL)   Basophils Relative 0  0 - 1 (%)   Basophils Absolute 0.0  0.0 - 0.1 (K/uL)  BASIC METABOLIC PANEL  Component Value Range   Sodium 136  135 - 145 (mEq/L)   Potassium 4.0  3.5 - 5.1 (mEq/L)   Chloride 98  96 - 112 (mEq/L)   CO2 29  19 - 32 (mEq/L)   Glucose, Bld 100 (*) 70 - 99 (mg/dL)   BUN 11  6 - 23  (mg/dL)   Creatinine, Ser 1.61  0.50 - 1.10 (mg/dL)   Calcium 09.6  8.4 - 10.5 (mg/dL)   GFR calc non Af Amer >90  >90 (mL/min)   GFR calc Af Amer >90  >90 (mL/min)  URINALYSIS, ROUTINE W REFLEX MICROSCOPIC      Component Value Range   Color, Urine YELLOW  YELLOW    APPearance CLEAR  CLEAR    Specific Gravity, Urine 1.015  1.005 - 1.030    pH 7.5  5.0 - 8.0    Glucose, UA NEGATIVE  NEGATIVE (mg/dL)   Hgb urine dipstick NEGATIVE  NEGATIVE    Bilirubin Urine NEGATIVE  NEGATIVE    Ketones, ur 15 (*) NEGATIVE (mg/dL)   Protein, ur NEGATIVE  NEGATIVE (mg/dL)   Urobilinogen, UA 0.2  0.0 - 1.0 (mg/dL)   Nitrite NEGATIVE  NEGATIVE    Leukocytes, UA NEGATIVE  NEGATIVE      Dg Abd Acute W/chest  03/30/2011  *RADIOLOGY REPORT*  Clinical Data: Upper abdominal pain.  ACUTE ABDOMEN SERIES (ABDOMEN 2 VIEW & CHEST 1 VIEW)  Comparison: None  Findings: The lungs are clear.  No edema or infiltrate.  Heart size is normal.  Abdominal films show moderate fecal material in the proximal colon. No evidence of bowel obstruction.  There are some oval shaped densities in the right lower quadrant and left upper abdomen which likely represent ingested pills.  Clips are seen related to prior cholecystectomy.  No free air.  Bony structures are unremarkable aside from mild degenerative disease of the lumbar spine.  IMPRESSION: No evidence of acute bowel obstruction.  Original Report Authenticated By: Reola Calkins, M.D.    Date: 03/31/2011  1521  Rate: 77  Rhythm: normal sinus rhythm and premature atrial contractions (PAC)  QRS Axis: normal  Intervals: normal  ST/T Wave abnormalities: normal  Conduction Disutrbances:none  Narrative Interpretation:   Old EKG Reviewed: changes noted 10/20/2007 rate faster      MDM  Patient with intermittent abdominal pain that began today. Labs with mild leukocytosis otherwise normal. Acute abdominal series unremarkable except for increased stool burden. Patient received  IVF, analgesic, antiemetic with relief. Took PO fluids. Pt feels improved after observation and/or treatment in ED.Pt stable in ED with no significant deterioration in condition.The patient appears reasonably screened and/or stabilized for discharge and I doubt any other medical condition or other [facility] requiring further screening, evaluation, or treatment in the ED at this time prior to discharge.  I personally performed the services described in this documentation, which was scribed in my presence. The recorded information has been reviewed and considered.  MDM Reviewed: nursing note and vitals Interpretation: labs, x-ray and ECG       Aurther Loft S. Colon Branch, MD 03/31/11 1231

## 2011-03-31 ENCOUNTER — Emergency Department (HOSPITAL_COMMUNITY): Payer: 59

## 2011-03-31 ENCOUNTER — Encounter (HOSPITAL_COMMUNITY): Payer: Self-pay | Admitting: Emergency Medicine

## 2011-03-31 ENCOUNTER — Inpatient Hospital Stay (HOSPITAL_COMMUNITY)
Admission: EM | Admit: 2011-03-31 | Discharge: 2011-04-13 | DRG: 337 | Disposition: A | Discharge status: Home / Self Care | Payer: 59 | Attending: General Surgery | Admitting: General Surgery

## 2011-03-31 DIAGNOSIS — I2510 Atherosclerotic heart disease of native coronary artery without angina pectoris: Secondary | ICD-10-CM | POA: Diagnosis present

## 2011-03-31 DIAGNOSIS — E876 Hypokalemia: Secondary | ICD-10-CM | POA: Diagnosis not present

## 2011-03-31 DIAGNOSIS — E119 Type 2 diabetes mellitus without complications: Secondary | ICD-10-CM | POA: Diagnosis present

## 2011-03-31 DIAGNOSIS — Z853 Personal history of malignant neoplasm of breast: Secondary | ICD-10-CM

## 2011-03-31 DIAGNOSIS — G4733 Obstructive sleep apnea (adult) (pediatric): Secondary | ICD-10-CM | POA: Diagnosis present

## 2011-03-31 DIAGNOSIS — I10 Essential (primary) hypertension: Secondary | ICD-10-CM | POA: Diagnosis present

## 2011-03-31 DIAGNOSIS — G622 Polyneuropathy due to other toxic agents: Secondary | ICD-10-CM | POA: Diagnosis present

## 2011-03-31 DIAGNOSIS — K56609 Unspecified intestinal obstruction, unspecified as to partial versus complete obstruction: Secondary | ICD-10-CM | POA: Diagnosis present

## 2011-03-31 DIAGNOSIS — G620 Drug-induced polyneuropathy: Secondary | ICD-10-CM

## 2011-03-31 DIAGNOSIS — T451X5A Adverse effect of antineoplastic and immunosuppressive drugs, initial encounter: Secondary | ICD-10-CM | POA: Diagnosis present

## 2011-03-31 DIAGNOSIS — K565 Intestinal adhesions [bands], unspecified as to partial versus complete obstruction: Principal | ICD-10-CM | POA: Diagnosis present

## 2011-03-31 HISTORY — DX: Obstructive sleep apnea (adult) (pediatric): G47.33

## 2011-03-31 HISTORY — DX: Drug-induced polyneuropathy: G62.0

## 2011-03-31 HISTORY — DX: Dependence on other enabling machines and devices: Z99.89

## 2011-03-31 LAB — COMPREHENSIVE METABOLIC PANEL
ALT: 29 U/L (ref 0–35)
Albumin: 3.9 g/dL (ref 3.5–5.2)
Alkaline Phosphatase: 61 U/L (ref 39–117)
BUN: 13 mg/dL (ref 6–23)
Calcium: 9.8 mg/dL (ref 8.4–10.5)
GFR calc Af Amer: >90 mL/min (ref >90)
Glucose, Bld: 176 mg/dL — ABNORMAL HIGH (ref 70–99)
Potassium: 3.5 mEq/L (ref 3.5–5.1)
Sodium: 138 mEq/L (ref 135–145)
Total Protein: 7.1 g/dL (ref 6.0–8.3)

## 2011-03-31 LAB — DIFFERENTIAL
Basophils Relative: 0 % (ref 0–1)
Eosinophils Absolute: 0 10*3/uL (ref 0.0–0.7)
Eosinophils Relative: 0 % (ref 0–5)
Lymphs Abs: 1.3 10*3/uL (ref 0.7–4.0)
Neutrophils Relative %: 81 % — ABNORMAL HIGH (ref 43–77)

## 2011-03-31 LAB — CBC
MCH: 32.6 pg (ref 26.0–34.0)
MCHC: 33.4 g/dL (ref 30.0–36.0)
MCV: 97.6 fL (ref 78.0–100.0)
Platelets: 264 10*3/uL (ref 150–400)
RDW: 13.7 % (ref 11.5–15.5)

## 2011-03-31 MED ORDER — SODIUM CHLORIDE 0.9 % IV BOLUS (SEPSIS)
1000.0000 mL | Freq: Once | INTRAVENOUS | Status: AC
  Administered 2011-03-31: 1000 mL via INTRAVENOUS

## 2011-03-31 MED ORDER — IOHEXOL 300 MG/ML  SOLN
100.0000 mL | Freq: Once | INTRAMUSCULAR | Status: AC | PRN
  Administered 2011-03-31: 100 mL via INTRAVENOUS

## 2011-03-31 MED ORDER — PANTOPRAZOLE SODIUM 40 MG IV SOLR
40.0000 mg | Freq: Once | INTRAVENOUS | Status: AC
  Administered 2011-03-31: 40 mg via INTRAVENOUS
  Filled 2011-03-31: qty 40

## 2011-03-31 MED ORDER — ONDANSETRON HCL 4 MG/2ML IJ SOLN
4.0000 mg | Freq: Once | INTRAMUSCULAR | Status: AC
  Administered 2011-03-31: 4 mg via INTRAVENOUS
  Filled 2011-03-31: qty 2

## 2011-03-31 MED ORDER — HYDROMORPHONE HCL PF 1 MG/ML IJ SOLN
1.0000 mg | Freq: Once | INTRAMUSCULAR | Status: AC
  Administered 2011-03-31: 1 mg via INTRAMUSCULAR
  Filled 2011-03-31: qty 1

## 2011-03-31 MED ORDER — FAMOTIDINE IN NACL 20-0.9 MG/50ML-% IV SOLN
20.0000 mg | Freq: Once | INTRAVENOUS | Status: AC
  Administered 2011-03-31: 20 mg via INTRAVENOUS
  Filled 2011-03-31: qty 50

## 2011-03-31 NOTE — ED Notes (Signed)
MD at bedside. 

## 2011-03-31 NOTE — H&P (Signed)
Michaela Wilcox is an 60 y.o. female.    PCP: Evelene Croon, MD She's also followed by Dr. Nanetta Batty, with [facility]. Heart and vascular.  Chief Complaint: Abdominal pain, along with nausea and vomiting  HPI: This is a 60 year old, Caucasian female, with a past medical history of coronary artery disease, hypertension, diabetes, obesity, history of breast cancer, who was in her usual state of health a few days ago, when she started having pain and discomfort in the upper abdomen. The pain was as sharp. Denies any radiation of the pain. The intensity was 8/10. She also mentioned that she has not had a bowel movement in many days. Has not passed any flatus as well. Has had nausea with multiple episodes of vomiting over the past 2 days. She's not had similar symptoms in the past. She's feeling very dehydrated. Has been passing urine however. Denies any weight loss recently. She did present to the ED yesterday with similar complaints. She was given symptomatic treatment and was asked return to the ED if her symptoms did not improve, and, so, she decided to present back today. After receiving pain medications patient is feeling better. But her nausea persists. Denies any fever or chills.   Prior to Admission medications   Medication Sig Start Date End Date Taking? Authorizing Provider  aspirin EC 81 MG tablet Take 81 mg by mouth every morning.     Yes Historical Provider, MD  clopidogrel (PLAVIX) 75 MG tablet Take 75 mg by mouth every morning.     Yes Historical Provider, MD  colesevelam (WELCHOL) 625 MG tablet Take 1,875 mg by mouth 2 (two) times daily.     Yes Historical Provider, MD  Fesoterodine Fumarate (TOVIAZ) 8 MG TB24 Take 8 mg by mouth at bedtime.     Yes Historical Provider, MD  glipiZIDE (GLUCOTROL) 10 MG tablet Take 10 mg by mouth 2 (two) times daily with a meal.     Yes Historical Provider, MD  isosorbide mononitrate (IMDUR) 60 MG 24 hr tablet Take 60 mg by mouth every morning.      Yes Historical Provider, MD  levothyroxine (SYNTHROID, LEVOTHROID) 125 MCG tablet Take 125 mcg by mouth every morning.     Yes Historical Provider, MD  losartan (COZAAR) 50 MG tablet Take 50 mg by mouth every morning.     Yes Historical Provider, MD  naproxen sodium (ANAPROX) 220 MG tablet Take 220 mg by mouth 2 (two) times daily.     Yes Historical Provider, MD  omeprazole (PRILOSEC) 20 MG capsule Take 20 mg by mouth every morning.     Yes Historical Provider, MD  pregabalin (LYRICA) 75 MG capsule Take 75 mg by mouth 2 (two) times daily.     Yes Historical Provider, MD  promethazine (PHENERGAN) 25 MG tablet Take 1 tablet (25 mg total) by mouth every 6 (six) hours as needed for nausea. 03/30/11 04/06/11 Yes Nicoletta Dress. Colon Branch, MD  rosuvastatin (CRESTOR) 20 MG tablet Take 20 mg by mouth at bedtime.     Yes Historical Provider, MD    Allergies: No Known Allergies  Past Medical History  Diagnosis Date  . Coronary artery disease   . Hypertension   . High cholesterol   . Diabetes mellitus   . Cancer 7/05    breast; mastectomy, chemo and radiation  . 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  Past Surgical History  Procedure Date  . Breast surgery   . Cardiac surgery   . Tubal ligation   . Appendectomy   . Cholecystectomy   . Thyroid surgery     Social History:  reports that she has been smoking Cigarettes.  She has been smoking about 1 pack per day. She does not have any smokeless tobacco history on file. She reports that she does not drink alcohol or use illicit drugs.  Family History:  Family History  Problem Relation Age of Onset  . Crohn's disease Sister     Review of Systems - History obtained from the patient General ROS: negative Psychological ROS: negative Hematological and Lymphatic ROS: negative Endocrine ROS: negative Breast ROS: positive for - history of cancer and left breast  surgery Respiratory ROS: negative Cardiovascular ROS: negative Gastrointestinal ROS: As in HPI Genito-Urinary ROS: negative Musculoskeletal ROS: negative Neurological ROS: negative Dermatological ROS: negative  Physical Examination Blood pressure 135/60, pulse 73, temperature 98.7 F (37.1 C), resp. rate 25, height 5\' 4"  (1.626 m), weight 88.451 kg (195 lb), SpO2 96.00%.  General appearance: alert, cooperative, no distress and moderately obese Head: Normocephalic, without obvious abnormality, atraumatic Eyes: conjunctivae/corneas clear. PERRL, EOM's intact. Fundi benign. Throat: dry MM Neck: no adenopathy, no carotid bruit, no JVD and supple, symmetrical, trachea midline Resp: few crackles at bases decreasing with deep breaths Cardio: regular rate and rhythm, S1, S2 normal, no murmur, click, rub or gallop GI: Soft, mildly distended, tender in upper abdomen, no rebound, no rigidity, BS present, no massess or organomegaly. Pulses: 2+ and symmetric Skin: Skin color, texture, turgor normal. No rashes or lesions Lymph nodes: Cervical, supraclavicular, and axillary nodes normal. Neurologic: Grossly normal  Results for orders placed during the hospital encounter of 03/31/11 (from the past 48 hour(s))  CBC     Status: Abnormal   Collection Time   03/31/11  8:26 PM      Component Value Range Comment   WBC 11.7 (*) 4.0 - 10.5 (K/uL)    RBC 4.51  3.87 - 5.11 (MIL/uL)    Hemoglobin 14.7  12.0 - 15.0 (g/dL)    HCT 16.1  09.6 - 04.5 (%)    MCV 97.6  78.0 - 100.0 (fL)    MCH 32.6  26.0 - 34.0 (pg)    MCHC 33.4  30.0 - 36.0 (g/dL)    RDW 40.9  81.1 - 91.4 (%)    Platelets 264  150 - 400 (K/uL)   DIFFERENTIAL     Status: Abnormal   Collection Time   03/31/11  8:26 PM      Component Value Range Comment   Neutrophils Relative 81 (*) 43 - 77 (%)    Neutro Abs 9.5 (*) 1.7 - 7.7 (K/uL)    Lymphocytes Relative 11 (*) 12 - 46 (%)    Lymphs Abs 1.3  0.7 - 4.0 (K/uL)    Monocytes Relative 8  3  - 12 (%)    Monocytes Absolute 0.9  0.1 - 1.0 (K/uL)    Eosinophils Relative 0  0 - 5 (%)    Eosinophils Absolute 0.0  0.0 - 0.7 (K/uL)    Basophils Relative 0  0 - 1 (%)    Basophils Absolute 0.0  0.0 - 0.1 (K/uL)   COMPREHENSIVE METABOLIC PANEL     Status: Abnormal   Collection Time   03/31/11  8:26 PM      Component Value Range Comment   Sodium 138  135 - 145 (mEq/L)  Potassium 3.5  3.5 - 5.1 (mEq/L)    Chloride 94 (*) 96 - 112 (mEq/L)    CO2 37 (*) 19 - 32 (mEq/L)    Glucose, Bld 176 (*) 70 - 99 (mg/dL)    BUN 13  6 - 23 (mg/dL)    Creatinine, Ser 0.45  0.50 - 1.10 (mg/dL)    Calcium 9.8  8.4 - 10.5 (mg/dL)    Total Protein 7.1  6.0 - 8.3 (g/dL)    Albumin 3.9  3.5 - 5.2 (g/dL)    AST 26  0 - 37 (U/L)    ALT 29  0 - 35 (U/L)    Alkaline Phosphatase 61  39 - 117 (U/L)    Total Bilirubin 0.6  0.3 - 1.2 (mg/dL)    GFR calc non Af Amer 88 (*) >90 (mL/min)    GFR calc Af Amer >90  >90 (mL/min)    Ct Abdomen Pelvis W Contrast  03/31/2011  *RADIOLOGY REPORT*  Clinical Data: Abdominal pain  small bowel obstruction pattern. Transition point in the right lower quadrant.  CT ABDOMEN AND PELVIS WITH CONTRAST  Technique:  Multidetector CT imaging of the abdomen and pelvis was performed following the standard protocol during bolus administration of intravenous contrast.  Contrast: OMNIPAQUE IOHEXOL 300 MG/ML IV SOLN  Comparison: None.  Findings: Status post left mastectomy.  Normal heart size. Coronary artery calcification.  Lung bases are predominately clear.  Low attenuation of the liver without focal abnormality.  Status post cholecystectomy.  No biliary ductal dilatation.  Unremarkable spleen, pancreas, adrenal glands.  Simple cyst right upper pole.  Otherwise, symmetric renal enhancement.  No hydronephrosis or hydroureter.  Small bowel obstruction pattern with dilated loops of small bowel up to 4.1 cm.  Distal small bowel loops are decompressed. Transition point appears to be in the  right lower quadrant where there is a kinked loop of small bowel with wall thickening. Colonic diverticulosis without diverticulitis. Appendix not identified.  Thin-walled bladder.  Unremarkable CT appearance to the uterus and adnexa.  Trace free fluid.  No free intraperitoneal air.  Multilevel degenerative changes of the imaged spine. No acute or aggressive appearing osseous lesion.  IMPRESSION: Small bowel obstruction pattern with dilated loops of small bowel up to 4.1 cm.  Distal small bowel loops are decompressed. Transition point appears to be in the right lower quadrant where there is a kinked loop of small bowel with wall thickening. Infectious, inflammatory and ischemic etiologies are considerations. Appendix not identified.  Discussed via telephone with Dr. Colon Branch at 10:10 p.m. on 03/31/2011.  Original Report Authenticated By: Waneta Martins, M.D.   Dg Abd Acute W/chest  03/30/2011  *RADIOLOGY REPORT*  Clinical Data: Upper abdominal pain.  ACUTE ABDOMEN SERIES (ABDOMEN 2 VIEW & CHEST 1 VIEW)  Comparison: None  Findings: The lungs are clear.  No edema or infiltrate.  Heart size is normal.  Abdominal films show moderate fecal material in the proximal colon. No evidence of bowel obstruction.  There are some oval shaped densities in the right lower quadrant and left upper abdomen which likely represent ingested pills.  Clips are seen related to prior cholecystectomy.  No free air.  Bony structures are unremarkable aside from mild degenerative disease of the lumbar spine.  IMPRESSION: No evidence of acute bowel obstruction.  Original Report Authenticated By: Reola Calkins, M.D.     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 Patient has  had previous abdominal surgeries and so adhesions is a possibility. Surgery has being contacted by the ED physician and they will evaluate the patient in the morning. NG tube will be placed. Patient be kept n.p.o.  #2 history of, diabetes: Sliding scale insulin will be provided. HbA1c will be checked.  #3 history of hypothyroidism. Will check thyroid function tests.  #4 history of coronary artery disease, and hypertension: Patient does not have any chest pain. These issues appear to be stable. As soon as she is able to take orally her aspirin and Plavix will be continued. She's not on beta blockers at this time. Her ejection fraction from 2010, was about 55%.  #5 history of neuropathy, secondary to chemotherapeutic agents: Continue with her Lyrica when she is able to take orally.  #6 DVT, prophylaxis will be initiated.  She's a full code.  Further management decisions will depend on results of further testing and patient's response to treatment.  [facility]  [facility] Pager [phone]  03/31/2011, 11:46 PM

## 2011-03-31 NOTE — ED Notes (Signed)
Pt c/o continued n/constipation.lnbm-03/28/11.  Syncopal episode today per family. Pt also c/o weakness.

## 2011-03-31 NOTE — ED Notes (Signed)
Pt was seen here on yesterday c/o abdominal pain. Pt still c/o epigastric and generalized abdominal pain 10/10 at this time. Pt reports nausea and vomiting at this time. Positive bowel sounds.

## 2011-03-31 NOTE — ED Provider Notes (Signed)
History   Scribed for [employer]. Colon Branch, MD, the patient was seen in room APA06/APA06 . This chart was scribed by Ellie Lunch.    CSN: [id number] Arrival date & time: 03/31/2011  6:37 PM   First MD Initiated Contact with Patient 03/31/11 1840      Chief Complaint  Patient presents with  . Abdominal Pain    (Consider location/radiation/quality/duration/timing/severity/associated sxs/prior treatment) HPI Michaela Wilcox is a 60 y.o. femalewwith a h/o CAD s/p stent LAD and circ, diabetes, hypertension, breast cancer s/p left mastectomy , chemo and radiation, hypothyroidism who presents to the Emergency Department complaining of abdominal pain with associated nausea and vomiting.  Pt was seen in  ED yesterday for same and reports Sx have gradually worsened. Pt has taken laxatives and medicine for the nausea with no improvement. Pt has been vomiting intermittently onset last night. Pt was unable to keep fluids or medicine down. Pt states pain radiates through abdomen and back. Pt denies fever. Pt has been unable to have bowel movements since discharged yesterday. Pt has Hx of breast cancer, and has had lymph nodes removed due to lymphedema.   Past Medical History  Diagnosis Date  . Coronary artery disease   . Hypertension   . High cholesterol   . Diabetes mellitus   . Cancer 7/05    breast; mastectomy, chemo and radiation    Past Surgical History  Procedure Date  . Breast surgery   . Cardiac surgery     No family history on file.  History  Substance Use Topics  . Smoking status: Current Everyday Smoker -- 1.0 packs/day    Types: Cigarettes  . Smokeless tobacco: Not on file  . Alcohol Use: No    Review of Systems 10 Systems reviewed and are negative for acute change except as noted in the HPI.  Allergies  Review of patient's allergies indicates no known allergies.  Home Medications   Current Outpatient Rx  Name Route Sig Dispense Refill  . ASPIRIN EC 81 MG PO TBEC Oral  Take 81 mg by mouth every morning.      Marland Kitchen CLOPIDOGREL BISULFATE 75 MG PO TABS Oral Take 75 mg by mouth every morning.      . COLESEVELAM HCL 625 MG PO TABS Oral Take 1,875 mg by mouth 2 (two) times daily.      . FESOTERODINE FUMARATE ER 8 MG PO TB24 Oral Take 8 mg by mouth at bedtime.      Marland Kitchen GLIPIZIDE 10 MG PO TABS Oral Take 10 mg by mouth 2 (two) times daily with a meal.      . ISOSORBIDE MONONITRATE ER 60 MG PO TB24 Oral Take 60 mg by mouth every morning.      Marland Kitchen LEVOTHYROXINE SODIUM 125 MCG PO TABS Oral Take 125 mcg by mouth every morning.      Marland Kitchen LOSARTAN POTASSIUM 50 MG PO TABS Oral Take 50 mg by mouth every morning.      Marland Kitchen NAPROXEN SODIUM 220 MG PO TABS Oral Take 220 mg by mouth 2 (two) times daily.      Marland Kitchen OMEPRAZOLE 20 MG PO CPDR Oral Take 20 mg by mouth every morning.      Marland Kitchen PREGABALIN 75 MG PO CAPS Oral Take 75 mg by mouth 2 (two) times daily.      Marland Kitchen PROMETHAZINE HCL 25 MG PO TABS Oral Take 1 tablet (25 mg total) by mouth every 6 (six) hours as needed for nausea. 20 tablet 0  .  ROSUVASTATIN CALCIUM 20 MG PO TABS Oral Take 20 mg by mouth at bedtime.        BP 161/70  Pulse 68  Temp 98.7 F (37.1 C)  Resp 17  Ht 5\' 4"  (1.626 m)  Wt 195 lb (88.451 kg)  BMI 33.47 kg/m2  SpO2 96%  Physical Exam  Nursing note and vitals reviewed. Constitutional: She is oriented to person, place, and time. She appears well-developed. No distress.  HENT:  Head: Normocephalic and atraumatic.  Right Ear: External ear normal.  Left Ear: External ear normal.  Mouth/Throat: Oropharynx is clear and moist.  Eyes: Conjunctivae are normal. Pupils are equal, round, and reactive to light.  Cardiovascular: Normal rate, regular rhythm and normal heart sounds.  Exam reveals no gallop and no friction rub.   No murmur heard. Pulmonary/Chest: Effort normal and breath sounds normal.  Abdominal: Soft. Bowel sounds are normal. There is tenderness. There is no rebound and no guarding.       Minor tenderness in RUQ,  and epigastric tenderness.   Musculoskeletal: Normal range of motion.  Neurological: She is alert and oriented to person, place, and time.  Skin: Skin is warm and dry.  Psychiatric: She has a normal mood and affect. Her behavior is normal.    ED Course  Procedures (including critical care time) DIAGNOSTIC STUDIES: Oxygen Saturation is 96% on RA, normal by my interpretation.   Results for orders placed during the hospital encounter of 03/30/11  CBC      Component Value Range   WBC 12.8 (*) 4.0 - 10.5 (K/uL)   RBC 4.39  3.87 - 5.11 (MIL/uL)   Hemoglobin 14.2  12.0 - 15.0 (g/dL)   HCT 16.1  09.6 - 04.5 (%)   MCV 96.6  78.0 - 100.0 (fL)   MCH 32.3  26.0 - 34.0 (pg)   MCHC 33.5  30.0 - 36.0 (g/dL)   RDW 40.9  81.1 - 91.4 (%)   Platelets 260  150 - 400 (K/uL)  DIFFERENTIAL      Component Value Range   Neutrophils Relative 78 (*) 43 - 77 (%)   Neutro Abs 10.0 (*) 1.7 - 7.7 (K/uL)   Lymphocytes Relative 17  12 - 46 (%)   Lymphs Abs 2.1  0.7 - 4.0 (K/uL)   Monocytes Relative 4  3 - 12 (%)   Monocytes Absolute 0.5  0.1 - 1.0 (K/uL)   Eosinophils Relative 1  0 - 5 (%)   Eosinophils Absolute 0.1  0.0 - 0.7 (K/uL)   Basophils Relative 0  0 - 1 (%)   Basophils Absolute 0.0  0.0 - 0.1 (K/uL)  BASIC METABOLIC PANEL      Component Value Range   Sodium 136  135 - 145 (mEq/L)   Potassium 4.0  3.5 - 5.1 (mEq/L)   Chloride 98  96 - 112 (mEq/L)   CO2 29  19 - 32 (mEq/L)   Glucose, Bld 100 (*) 70 - 99 (mg/dL)   BUN 11  6 - 23 (mg/dL)   Creatinine, Ser 7.82  0.50 - 1.10 (mg/dL)   Calcium 95.6  8.4 - 10.5 (mg/dL)   GFR calc non Af Amer >90  >90 (mL/min)   GFR calc Af Amer >90  >90 (mL/min)  URINALYSIS, ROUTINE W REFLEX MICROSCOPIC      Component Value Range   Color, Urine YELLOW  YELLOW    APPearance CLEAR  CLEAR    Specific Gravity, Urine 1.015  1.005 - 1.030  pH 7.5  5.0 - 8.0    Glucose, UA NEGATIVE  NEGATIVE (mg/dL)   Hgb urine dipstick NEGATIVE  NEGATIVE    Bilirubin Urine  NEGATIVE  NEGATIVE    Ketones, ur 15 (*) NEGATIVE (mg/dL)   Protein, ur NEGATIVE  NEGATIVE (mg/dL)   Urobilinogen, UA 0.2  0.0 - 1.0 (mg/dL)   Nitrite NEGATIVE  NEGATIVE    Leukocytes, UA NEGATIVE  NEGATIVE    Ct Abdomen Pelvis W Contrast  03/31/2011  *RADIOLOGY REPORT*  Clinical Data: Abdominal pain  small bowel obstruction pattern. Transition point in the right lower quadrant.  CT ABDOMEN AND PELVIS WITH CONTRAST  Technique:  Multidetector CT imaging of the abdomen and pelvis was performed following the standard protocol during bolus administration of intravenous contrast.  Contrast: OMNIPAQUE IOHEXOL 300 MG/ML IV SOLN  Comparison: None.  Findings: Status post left mastectomy.  Normal heart size. Coronary artery calcification.  Lung bases are predominately clear.  Low attenuation of the liver without focal abnormality.  Status post cholecystectomy.  No biliary ductal dilatation.  Unremarkable spleen, pancreas, adrenal glands.  Simple cyst right upper pole.  Otherwise, symmetric renal enhancement.  No hydronephrosis or hydroureter.  Small bowel obstruction pattern with dilated loops of small bowel up to 4.1 cm.  Distal small bowel loops are decompressed. Transition point appears to be in the right lower quadrant where there is a kinked loop of small bowel with wall thickening. Colonic diverticulosis without diverticulitis. Appendix not identified.  Thin-walled bladder.  Unremarkable CT appearance to the uterus and adnexa.  Trace free fluid.  No free intraperitoneal air.  Multilevel degenerative changes of the imaged spine. No acute or aggressive appearing osseous lesion.  IMPRESSION: Small bowel obstruction pattern with dilated loops of small bowel up to 4.1 cm.  Distal small bowel loops are decompressed. Transition point appears to be in the right lower quadrant where there is a kinked loop of small bowel with wall thickening. Infectious, inflammatory and ischemic etiologies are considerations.  Appendix not identified.  Discussed via telephone with Dr. Colon Branch at 10:10 p.m. on 03/31/2011.  Original Report Authenticated By: Waneta Martins, M.D.   Dg Abd Acute W/chest  03/30/2011  *RADIOLOGY REPORT*  Clinical Data: Upper abdominal pain.  ACUTE ABDOMEN SERIES (ABDOMEN 2 VIEW & CHEST 1 VIEW)  Comparison: None  Findings: The lungs are clear.  No edema or infiltrate.  Heart size is normal.  Abdominal films show moderate fecal material in the proximal colon. No evidence of bowel obstruction.  There are some oval shaped densities in the right lower quadrant and left upper abdomen which likely represent ingested pills.  Clips are seen related to prior cholecystectomy.  No free air.  Bony structures are unremarkable aside from mild degenerative disease of the lumbar spine.  IMPRESSION: No evidence of acute bowel obstruction.  Original Report Authenticated By: Reola Calkins, M.D.   COORDINATION OF CARE: 2215 Spoke with radiologist, Dr. Caryl Pina who advised CT showing small bowel obstruction. He asked about previous appendectomy. Patient verifes she has had appendectomy in the past. 2300 Spoke with Dr. Rito Ehrlich, hospitalist.  [phone] Spoke with Dr. Caesar Bookman, surgeon. He advised hospitalist can admit and he will consult. 2310 Spoke with Dr. Evelena Asa, hospitalist. He will admit patient. ED MEDICATIONS Medications  sodium chloride 0.9 % bolus 1,000 mL  ondansetron (ZOFRAN) injection 4 mg   HYDROmorphone (DILAUDID) injection 1 mg   pantoprazole (PROTONIX) injection 40 mg   famotidine (PEPCID) IVPB 20 mg  MDM  Patient with vomiting and abdominal pain now present for two days. This is the second visit to the ER. Labs with elevated wbc as 12.8 with let shift. Acute abdominal series done yesterday with no evidence of obstruction. CT today showing early small bowel obstruction.Spoke with both hospitalist and surgeon. Hospitalist will admit patient and surgeon will consult.Pt stable in ED with no  significant deterioration in condition.The patient appears reasonably stabilized for admission considering the current resources, flow, and capabilities available in the ED at this time, and I doubt any other [facility] requiring further screening and/or treatment in the ED prior to admission.  I personally performed the services described in this documentation, which was scribed in my presence. The recorded information has been reviewed and considered.  CRITICAL CARE Performed by: Annamarie Dawley.   Total critical care time: 40  Critical care time was exclusive of separately billable procedures and treating other patients.  Critical care was necessary to treat or prevent imminent or life-threatening deterioration.  Critical care was time spent personally by me on the following activities: development of treatment plan with patient and/or surrogate as well as nursing, discussions with consultants, evaluation of patient's response to treatment, examination of patient, obtaining history from patient or surrogate, ordering and performing treatments and interventions, ordering and review of laboratory studies, ordering and review of radiographic studies, pulse oximetry and re-evaluation of patient's condition.     Nicoletta Dress. Colon Branch, MD 03/31/11 2321

## 2011-03-31 NOTE — ED Notes (Signed)
Patient transported to CT 

## 2011-04-01 ENCOUNTER — Encounter (HOSPITAL_COMMUNITY): Payer: Self-pay | Admitting: *Deleted

## 2011-04-01 DIAGNOSIS — E876 Hypokalemia: Secondary | ICD-10-CM | POA: Diagnosis not present

## 2011-04-01 LAB — COMPREHENSIVE METABOLIC PANEL
ALT: 28 U/L (ref 0–35)
AST: 25 U/L (ref 0–37)
Albumin: 4.1 g/dL (ref 3.5–5.2)
Alkaline Phosphatase: 62 U/L (ref 39–117)
BUN: 13 mg/dL (ref 6–23)
CO2: 33 mEq/L — ABNORMAL HIGH (ref 19–32)
Calcium: 9.9 mg/dL (ref 8.4–10.5)
Chloride: 96 mEq/L (ref 96–112)
Creatinine, Ser: 0.66 mg/dL (ref 0.50–1.10)
GFR calc Af Amer: >90 mL/min (ref >90)
GFR calc non Af Amer: >90 mL/min (ref >90)
Glucose, Bld: 157 mg/dL — ABNORMAL HIGH (ref 70–99)
Potassium: 3.3 mEq/L — ABNORMAL LOW (ref 3.5–5.1)
Sodium: 138 mEq/L (ref 135–145)
Total Bilirubin: 0.7 mg/dL (ref 0.3–1.2)
Total Protein: 7.5 g/dL (ref 6.0–8.3)

## 2011-04-01 LAB — TSH: TSH: 19.577 u[IU]/mL — ABNORMAL HIGH (ref 0.350–4.500)

## 2011-04-01 LAB — GLUCOSE, CAPILLARY
Glucose-Capillary: 141 mg/dL — ABNORMAL HIGH (ref 70–99)
Glucose-Capillary: 148 mg/dL — ABNORMAL HIGH (ref 70–99)

## 2011-04-01 LAB — HEMOGLOBIN A1C
Hgb A1c MFr Bld: 6.9 % — ABNORMAL HIGH (ref <5.7)
Mean Plasma Glucose: 151 mg/dL — ABNORMAL HIGH (ref <117)

## 2011-04-01 LAB — CBC
HCT: 44.3 % (ref 36.0–46.0)
MCHC: 33.2 g/dL (ref 30.0–36.0)
MCV: 97.8 fL (ref 78.0–100.0)
Platelets: 262 10*3/uL (ref 150–400)
RDW: 13.9 % (ref 11.5–15.5)

## 2011-04-01 MED ORDER — ENOXAPARIN SODIUM 40 MG/0.4ML ~~LOC~~ SOLN
40.0000 mg | SUBCUTANEOUS | Status: DC
  Administered 2011-04-01 – 2011-04-06 (×6): 40 mg via SUBCUTANEOUS
  Filled 2011-04-01 (×6): qty 0.4

## 2011-04-01 MED ORDER — CHLORHEXIDINE GLUCONATE 0.12 % MT SOLN
15.0000 mL | Freq: Two times a day (BID) | OROMUCOSAL | Status: DC
  Administered 2011-04-01 – 2011-04-13 (×23): 15 mL via OROMUCOSAL
  Filled 2011-04-01 (×22): qty 15

## 2011-04-01 MED ORDER — PANTOPRAZOLE SODIUM 40 MG IV SOLR
40.0000 mg | INTRAVENOUS | Status: DC
  Administered 2011-04-02 – 2011-04-10 (×9): 40 mg via INTRAVENOUS
  Filled 2011-04-01 (×9): qty 40

## 2011-04-01 MED ORDER — BIOTENE DRY MOUTH MT LIQD
15.0000 mL | Freq: Two times a day (BID) | OROMUCOSAL | Status: DC
  Administered 2011-04-01 – 2011-04-12 (×16): 15 mL via OROMUCOSAL

## 2011-04-01 MED ORDER — PHENOL 1.4 % MT LIQD
1.0000 | OROMUCOSAL | Status: DC | PRN
  Administered 2011-04-01: 1 via OROMUCOSAL
  Filled 2011-04-01: qty 177

## 2011-04-01 MED ORDER — KCL IN DEXTROSE-NACL 40-5-0.9 MEQ/L-%-% IV SOLN
INTRAVENOUS | Status: DC
  Administered 2011-04-01 – 2011-04-04 (×5): via INTRAVENOUS
  Filled 2011-04-01 (×11): qty 1000

## 2011-04-01 MED ORDER — SODIUM CHLORIDE 0.9 % IJ SOLN
INTRAMUSCULAR | Status: AC
  Administered 2011-04-01: 10 mL
  Filled 2011-04-01: qty 3

## 2011-04-01 MED ORDER — METOPROLOL TARTRATE 1 MG/ML IV SOLN: 5.0000 mg | Freq: Four times a day (QID) | INTRAVENOUS | Status: DC | PRN

## 2011-04-01 MED ORDER — HYDROMORPHONE HCL PF 1 MG/ML IJ SOLN
1.0000 mg | INTRAMUSCULAR | Status: DC | PRN
  Administered 2011-04-01 – 2011-04-06 (×9): 1 mg via INTRAVENOUS
  Filled 2011-04-01 (×9): qty 1

## 2011-04-01 MED ORDER — ALBUTEROL SULFATE (5 MG/ML) 0.5% IN NEBU: 2.5000 mg | INHALATION_SOLUTION | RESPIRATORY_TRACT | Status: DC | PRN

## 2011-04-01 MED ORDER — POTASSIUM CHLORIDE IN NACL 20-0.9 MEQ/L-% IV SOLN
INTRAVENOUS | Status: DC
  Administered 2011-04-01: 02:00:00 via INTRAVENOUS

## 2011-04-01 MED ORDER — INSULIN ASPART 100 UNIT/ML ~~LOC~~ SOLN
0.0000 [IU] | SUBCUTANEOUS | Status: DC
  Administered 2011-04-01 (×2): 2 [IU] via SUBCUTANEOUS
  Administered 2011-04-01: 3 [IU] via SUBCUTANEOUS
  Administered 2011-04-01: 2 [IU] via SUBCUTANEOUS
  Administered 2011-04-02 (×2): 3 [IU] via SUBCUTANEOUS
  Administered 2011-04-02 – 2011-04-03 (×6): 2 [IU] via SUBCUTANEOUS
  Administered 2011-04-03: 3 [IU] via SUBCUTANEOUS
  Administered 2011-04-03: 2 [IU] via SUBCUTANEOUS
  Administered 2011-04-04: 3 [IU] via SUBCUTANEOUS
  Administered 2011-04-04: 2 [IU] via SUBCUTANEOUS
  Administered 2011-04-04: 3 [IU] via SUBCUTANEOUS
  Administered 2011-04-05 – 2011-04-10 (×9): 2 [IU] via SUBCUTANEOUS
  Administered 2011-04-10 – 2011-04-11 (×2): 3 [IU] via SUBCUTANEOUS
  Administered 2011-04-11: 2 [IU] via SUBCUTANEOUS
  Administered 2011-04-12: 3 [IU] via SUBCUTANEOUS
  Administered 2011-04-13: 2 [IU] via SUBCUTANEOUS
  Administered 2011-04-13 (×2): 3 [IU] via SUBCUTANEOUS
  Filled 2011-04-01 (×3): qty 3

## 2011-04-01 MED ORDER — PROMETHAZINE HCL 12.5 MG PO TABS: 12.5000 mg | ORAL_TABLET | Freq: Four times a day (QID) | ORAL | Status: DC | PRN

## 2011-04-01 MED ORDER — PROMETHAZINE HCL 25 MG/ML IJ SOLN
12.5000 mg | Freq: Four times a day (QID) | INTRAMUSCULAR | Status: DC | PRN
  Administered 2011-04-01 – 2011-04-08 (×7): 12.5 mg via INTRAVENOUS
  Filled 2011-04-01 (×8): qty 1

## 2011-04-01 MED ORDER — SODIUM CHLORIDE 0.9 % IJ SOLN
INTRAMUSCULAR | Status: AC
  Filled 2011-04-01: qty 3

## 2011-04-01 NOTE — ED Notes (Signed)
AC called requesting 86F NGT. Currently only sizes on the floor include 78F, 53F, 86F.

## 2011-04-01 NOTE — Progress Notes (Signed)
Subjective: The patient says that she feels about the same, but perhaps she acknowledges that there is less abdominal pain. She has no nausea. She has passed no gas and has had no bowel movement. The NG tube is irritating her throat. The Cepacol spray is helping.  Objective: Vital signs in last 24 hours: Filed Vitals:   03/31/11 2217 04/01/11 0100 04/01/11 0142 04/01/11 0536  BP:  121/50 134/82 135/83  Pulse: 73  71 71  Temp:   98 F (36.7 C) 97.5 F (36.4 C)  TempSrc:   Oral Oral  Resp:  16 18 18   Height:   5\' 4"  (1.626 m)   Weight:   86.7 kg (191 lb 2.2 oz)   SpO2: 96%  98% 100%    Intake/Output Summary (Last 24 hours) at 04/01/11 1012 Last data filed at 04/01/11 1610  Gross per 24 hour  Intake 376.67 ml  Output    350 ml  Net  26.67 ml    Weight change:   General: Pleasant 60 year old woman sitting up in bed, in no acute distress. Lungs: Clear to auscultation anteriorly with decreased breath sounds in the bases. Heart: S1, S2, with no murmurs rubs or gallops. Abdomen: Rare bowel sounds, obese, mildly tender right mid abdomen and left lower quadrant. Mildly distended. No rigidity, no masses, no appreciable hepatosplenomegaly. The NG tube is draining bilious colored fluid. Extremities: No pedal edema. Neurologic: Alert and oriented x3.  Lab Results: Basic Metabolic Panel:  Basename 04/01/11 0500 03/31/11 2026  NA 138 138  K 3.3* 3.5  CL 96 94*  CO2 33* 37*  GLUCOSE 157* 176*  BUN 13 13  CREATININE 0.66 0.78  CALCIUM 9.9 9.8  MG -- --  PHOS -- --   Liver Function Tests:  Basename 04/01/11 0500 03/31/11 2026  AST 25 26  ALT 28 29  ALKPHOS 62 61  BILITOT 0.7 0.6  PROT 7.5 7.1  ALBUMIN 4.1 3.9   No results found for this basename: LIPASE:2,AMYLASE:2 in the last 72 hours No results found for this basename: AMMONIA:2 in the last 72 hours CBC:  Basename 04/01/11 0500 03/31/11 2026 03/30/11 1647  WBC 11.2* 11.7* --  NEUTROABS -- 9.5* 10.0*  HGB 14.7 14.7  --  HCT 44.3 44.0 --  MCV 97.8 97.6 --  PLT 262 264 --   Cardiac Enzymes: No results found for this basename: CKTOTAL:3,CKMB:3,CKMBINDEX:3,TROPONINI:3 in the last 72 hours BNP: No components found with this basename: POCBNP:3 D-Dimer: No results found for this basename: DDIMER:2 in the last 72 hours CBG:  Basename 04/01/11 0749 04/01/11 0456  GLUCAP 133* 164*   Hemoglobin A1C: No results found for this basename: HGBA1C in the last 72 hours Fasting Lipid Panel: No results found for this basename: CHOL,HDL,LDLCALC,TRIG,CHOLHDL,LDLDIRECT in the last 72 hours Thyroid Function Tests: No results found for this basename: TSH,T4TOTAL,FREET4,T3FREE,THYROIDAB in the last 72 hours Anemia Panel: No results found for this basename: VITAMINB12,FOLATE,FERRITIN,TIBC,IRON,RETICCTPCT in the last 72 hours Coagulation:  Basename 04/01/11 0500  LABPROT 13.8  INR 1.04   Urine Drug Screen: Drugs of Abuse  No results found for this basename: labopia, cocainscrnur, labbenz, amphetmu, thcu, labbarb    Alcohol Level: No results found for this basename: ETH:2 in the last 72 hours  Micro: No results found for this or any previous visit (from the past 240 hour(s)).  Studies/Results: Ct Abdomen Pelvis W Contrast  03/31/2011  *RADIOLOGY REPORT*  Clinical Data: Abdominal pain  small bowel obstruction pattern. Transition point in the right  lower quadrant.  CT ABDOMEN AND PELVIS WITH CONTRAST  Technique:  Multidetector CT imaging of the abdomen and pelvis was performed following the standard protocol during bolus administration of intravenous contrast.  Contrast: OMNIPAQUE IOHEXOL 300 MG/ML IV SOLN  Comparison: None.  Findings: Status post left mastectomy.  Normal heart size. Coronary artery calcification.  Lung bases are predominately clear.  Low attenuation of the liver without focal abnormality.  Status post cholecystectomy.  No biliary ductal dilatation.  Unremarkable spleen, pancreas, adrenal  glands.  Simple cyst right upper pole.  Otherwise, symmetric renal enhancement.  No hydronephrosis or hydroureter.  Small bowel obstruction pattern with dilated loops of small bowel up to 4.1 cm.  Distal small bowel loops are decompressed. Transition point appears to be in the right lower quadrant where there is a kinked loop of small bowel with wall thickening. Colonic diverticulosis without diverticulitis. Appendix not identified.  Thin-walled bladder.  Unremarkable CT appearance to the uterus and adnexa.  Trace free fluid.  No free intraperitoneal air.  Multilevel degenerative changes of the imaged spine. No acute or aggressive appearing osseous lesion.  IMPRESSION: Small bowel obstruction pattern with dilated loops of small bowel up to 4.1 cm.  Distal small bowel loops are decompressed. Transition point appears to be in the right lower quadrant where there is a kinked loop of small bowel with wall thickening. Infectious, inflammatory and ischemic etiologies are considerations. Appendix not identified.  Discussed via telephone with Dr. Colon Branch at 10:10 p.m. on 03/31/2011.  Original Report Authenticated By: Waneta Martins, M.D.   Dg Abd Acute W/chest  03/30/2011  *RADIOLOGY REPORT*  Clinical Data: Upper abdominal pain.  ACUTE ABDOMEN SERIES (ABDOMEN 2 VIEW & CHEST 1 VIEW)  Comparison: None  Findings: The lungs are clear.  No edema or infiltrate.  Heart size is normal.  Abdominal films show moderate fecal material in the proximal colon. No evidence of bowel obstruction.  There are some oval shaped densities in the right lower quadrant and left upper abdomen which likely represent ingested pills.  Clips are seen related to prior cholecystectomy.  No free air.  Bony structures are unremarkable aside from mild degenerative disease of the lumbar spine.  IMPRESSION: No evidence of acute bowel obstruction.  Original Report Authenticated By: Reola Calkins, M.D.    Medications: I have reviewed the patient's  current medications.  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 Small bowel obstruction. She is clinically stable. An NG tube was placed for decompression. She is on supportive treatment with as needed analgesics, as needed antiemetics, IV fluids for hydration, and IV Protonix. The abdominal x-ray revealed moderate fecal material in the proximal colon. The CT of her abdomen reveals small bowel wall thickening at the site of the obstruction. Will hold off on starting antibiotics, however, if she becomes febrile, will start them empirically. General surgery has been consulted. Consultation is pending.  Hypokalemia, likely secondary to NG drainage.  Hypertension and coronary artery disease. Stable, however, the patient is off of her antiplatelet and cardiac medications.  Type 2 diabetes mellitus. She is currently being treated with sliding scale NovoLog. Will adjust it as needed.   Plan:  1. Await general surgery's consultation. We'll add dextrose to the IV fluids, given that she is n.p.o. Will adjust sliding scale NovoLog  accordingly. We'll add when necessary IV metoprolol while she is n.p.o. We'll add potassium chloride to the IV fluids.    LOS: 1 day   Manali Mcelmurry 04/01/2011, 10:12 AM

## 2011-04-01 NOTE — Consult Note (Signed)
Reason for Consult:Nausea, vomiting, abdominal pain. Referring Physician: [facility]  Michaela Wilcox is an 61 y.o. female.  HPI: Patient presented with ~1wk history of increasing abdominal pain and nausea.  No fever or chills.  Has had several episodes of non-bloody emesis.  Last BM was ~5 days ago.  No flatus since that time.  No melena, no hematochezia.  No change in urination.  NO similar episodes in the past.  No sick contacts.  Last Wilcox-scope was approximately 2-3 yrs ago, only diverticular disease noted.  No episodes of diverticulitis.  Has had both chole and appy.  Tubal ligation.  Some improvement of abd symptoms since NG placed.  Past Medical History  Diagnosis Date  . Coronary artery disease   . Hypertension   . High cholesterol   . Diabetes mellitus   . Cancer 7/05    breast; mastectomy, chemo and radiation  . 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 Breast surgery   . Cardiac surgery   . Tubal ligation   . Appendectomy   . Cholecystectomy   . Thyroid surgery     Family History  Problem Relation Age of Onset  . Crohn's disease Sister     Social History:  reports that she has been smoking Cigarettes.  She has been smoking about 1 pack per day. She does not have any smokeless tobacco history on file. She reports that she does not drink alcohol or use illicit drugs.  Allergies: No Known Allergies  Medications:  I have reviewed the patient's current medications. Prior to Admission:  Prescriptions prior to admission  Medication Sig Dispense Refill  . aspirin EC 81 MG tablet Take 81 mg by mouth every morning.        . clopidogrel (PLAVIX) 75 MG tablet Take 75 mg by mouth every morning.        . colesevelam (WELCHOL) 625 MG tablet Take 1,875 mg by mouth 2 (two) times daily.        Marland Kitchen Fesoterodine Fumarate (TOVIAZ) 8 MG TB24 Take 8 mg by mouth at bedtime.        Marland Kitchen glipiZIDE (GLUCOTROL) 10 MG tablet Take 10 mg by mouth 2 (two) times  daily with a meal.        . isosorbide mononitrate (IMDUR) 60 MG 24 hr tablet Take 60 mg by mouth every morning.        Marland Kitchen levothyroxine (SYNTHROID, LEVOTHROID) 125 MCG tablet Take 125 mcg by mouth every morning.        Marland Kitchen losartan (COZAAR) 50 MG tablet Take 50 mg by mouth every morning.        . naproxen sodium (ANAPROX) 220 MG tablet Take 220 mg by mouth 2 (two) times daily.        Marland Kitchen omeprazole (PRILOSEC) 20 MG capsule Take 20 mg by mouth every morning.        . pregabalin (LYRICA) 75 MG capsule Take 75 mg by mouth 2 (two) times daily.        . promethazine (PHENERGAN) 25 MG tablet Take 1 tablet (25 mg total) by mouth every 6 (six) hours as needed for nausea.  20 tablet  0  . rosuvastatin (CRESTOR) 20 MG tablet Take 20 mg by mouth at bedtime.         Scheduled:   . antiseptic oral rinse  15 mL Mouth Rinse q12n4p  . chlorhexidine  15 mL Mouth Rinse BID  .  enoxaparin  40 mg Subcutaneous Q24H  . insulin aspart  0-15 Units Subcutaneous Q4H  . pantoprazole (PROTONIX) IV  40 mg Intravenous Q24H  . sodium chloride      . sodium chloride       Continuous:   . dextrose 5 % and 0.9 % NaCl with KCl 40 mEq/L 100 mL/hr at 04/01/11 1121  . DISCONTD: 0.9 % NaCl with KCl 20 mEq / L 100 mL/hr at 04/01/11 4098   JXB:JYNWGNFAO, HYDROmorphone, metoprolol, phenol, promethazine, promethazine  Results for orders placed during the hospital encounter of 03/31/11 (from the past 48 hour(s))  CBC     Status: Abnormal   Collection Time   03/31/11  8:26 PM      Component Value Range Comment   WBC 11.7 (*) 4.0 - 10.5 (K/uL)    RBC 4.51  3.87 - 5.11 (MIL/uL)    Hemoglobin 14.7  12.0 - 15.0 (g/dL)    HCT 13.0  86.5 - 78.4 (%)    MCV 97.6  78.0 - 100.0 (fL)    MCH 32.6  26.0 - 34.0 (pg)    MCHC 33.4  30.0 - 36.0 (g/dL)    RDW 69.6  29.5 - 28.4 (%)    Platelets 264  150 - 400 (K/uL)   DIFFERENTIAL     Status: Abnormal   Collection Time   03/31/11  8:26 PM      Component Value Range Comment   Neutrophils  Relative 81 (*) 43 - 77 (%)    Neutro Abs 9.5 (*) 1.7 - 7.7 (K/uL)    Lymphocytes Relative 11 (*) 12 - 46 (%)    Lymphs Abs 1.3  0.7 - 4.0 (K/uL)    Monocytes Relative 8  3 - 12 (%)    Monocytes Absolute 0.9  0.1 - 1.0 (K/uL)    Eosinophils Relative 0  0 - 5 (%)    Eosinophils Absolute 0.0  0.0 - 0.7 (K/uL)    Basophils Relative 0  0 - 1 (%)    Basophils Absolute 0.0  0.0 - 0.1 (K/uL)   COMPREHENSIVE METABOLIC PANEL     Status: Abnormal   Collection Time   03/31/11  8:26 PM      Component Value Range Comment   Sodium 138  135 - 145 (mEq/L)    Potassium 3.5  3.5 - 5.1 (mEq/L)    Chloride 94 (*) 96 - 112 (mEq/L)    CO2 37 (*) 19 - 32 (mEq/L)    Glucose, Bld 176 (*) 70 - 99 (mg/dL)    BUN 13  6 - 23 (mg/dL)    Creatinine, Ser 1.32  0.50 - 1.10 (mg/dL)    Calcium 9.8  8.4 - 10.5 (mg/dL)    Total Protein 7.1  6.0 - 8.3 (g/dL)    Albumin 3.9  3.5 - 5.2 (g/dL)    AST 26  0 - 37 (U/L)    ALT 29  0 - 35 (U/L)    Alkaline Phosphatase 61  39 - 117 (U/L)    Total Bilirubin 0.6  0.3 - 1.2 (mg/dL)    GFR calc non Af Amer 88 (*) >90 (mL/min)    GFR calc Af Amer >90  >90 (mL/min)   TSH     Status: Abnormal   Collection Time   04/01/11  2:28 AM      Component Value Range Comment   TSH 19.577 (*) 0.350 - 4.500 (uIU/mL)   HEMOGLOBIN A1C  Status: Abnormal   Collection Time   04/01/11  2:28 AM      Component Value Range Comment   Hemoglobin A1C 6.9 (*) <5.7 (%)    Mean Plasma Glucose 151 (*) <117 (mg/dL)   T4, FREE     Status: Normal   Collection Time   04/01/11  2:28 AM      Component Value Range Comment   Free T4 0.90  0.80 - 1.80 (ng/dL)   GLUCOSE, CAPILLARY     Status: Abnormal   Collection Time   04/01/11  4:56 AM      Component Value Range Comment   Glucose-Capillary 164 (*) 70 - 99 (mg/dL)    Comment 1 Notify RN     COMPREHENSIVE METABOLIC PANEL     Status: Abnormal   Collection Time   04/01/11  5:00 AM      Component Value Range Comment   Sodium 138  135 - 145 (mEq/L)      Potassium 3.3 (*) 3.5 - 5.1 (mEq/L)    Chloride 96  96 - 112 (mEq/L)    CO2 33 (*) 19 - 32 (mEq/L)    Glucose, Bld 157 (*) 70 - 99 (mg/dL)    BUN 13  6 - 23 (mg/dL)    Creatinine, Ser 1.02  0.50 - 1.10 (mg/dL)    Calcium 9.9  8.4 - 10.5 (mg/dL)    Total Protein 7.5  6.0 - 8.3 (g/dL)    Albumin 4.1  3.5 - 5.2 (g/dL)    AST 25  0 - 37 (U/L)    ALT 28  0 - 35 (U/L)    Alkaline Phosphatase 62  39 - 117 (U/L)    Total Bilirubin 0.7  0.3 - 1.2 (mg/dL)    GFR calc non Af Amer >90  >90 (mL/min)    GFR calc Af Amer >90  >90 (mL/min)   CBC     Status: Abnormal   Collection Time   04/01/11  5:00 AM      Component Value Range Comment   WBC 11.2 (*) 4.0 - 10.5 (K/uL)    RBC 4.53  3.87 - 5.11 (MIL/uL)    Hemoglobin 14.7  12.0 - 15.0 (g/dL)    HCT 72.5  36.6 - 44.0 (%)    MCV 97.8  78.0 - 100.0 (fL)    MCH 32.5  26.0 - 34.0 (pg)    MCHC 33.2  30.0 - 36.0 (g/dL)    RDW 34.7  42.5 - 95.6 (%)    Platelets 262  150 - 400 (K/uL)   PROTIME-INR     Status: Normal   Collection Time   04/01/11  5:00 AM      Component Value Range Comment   Prothrombin Time 13.8  11.6 - 15.2 (seconds)    INR 1.04  0.00 - 1.49    APTT     Status: Normal   Collection Time   04/01/11  5:00 AM      Component Value Range Comment   aPTT 27  24 - 37 (seconds)   GLUCOSE, CAPILLARY     Status: Abnormal   Collection Time   04/01/11  7:49 AM      Component Value Range Comment   Glucose-Capillary 133 (*) 70 - 99 (mg/dL)   GLUCOSE, CAPILLARY     Status: Abnormal   Collection Time   04/01/11 11:52 AM      Component Value Range Comment   Glucose-Capillary 144 (*) 70 - 99 (  mg/dL)   GLUCOSE, CAPILLARY     Status: Abnormal   Collection Time   04/01/11  5:02 PM      Component Value Range Comment   Glucose-Capillary 141 (*) 70 - 99 (mg/dL)   GLUCOSE, CAPILLARY     Status: Abnormal   Collection Time   04/01/11  8:21 PM      Component Value Range Comment   Glucose-Capillary 148 (*) 70 - 99 (mg/dL)    Comment 1 Notify  RN       Ct Abdomen Pelvis W Contrast  03/31/2011  *RADIOLOGY REPORT*  Clinical Data: Abdominal pain  small bowel obstruction pattern. Transition point in the right lower quadrant.  CT ABDOMEN AND PELVIS WITH CONTRAST  Technique:  Multidetector CT imaging of the abdomen and pelvis was performed following the standard protocol during bolus administration of intravenous contrast.  Contrast: OMNIPAQUE IOHEXOL 300 MG/ML IV SOLN  Comparison: None.  Findings: Status post left mastectomy.  Normal heart size. Coronary artery calcification.  Lung bases are predominately clear.  Low attenuation of the liver without focal abnormality.  Status post cholecystectomy.  No biliary ductal dilatation.  Unremarkable spleen, pancreas, adrenal glands.  Simple cyst right upper pole.  Otherwise, symmetric renal enhancement.  No hydronephrosis or hydroureter.  Small bowel obstruction pattern with dilated loops of small bowel up to 4.1 cm.  Distal small bowel loops are decompressed. Transition point appears to be in the right lower quadrant where there is a kinked loop of small bowel with wall thickening. Colonic diverticulosis without diverticulitis. Appendix not identified.  Thin-walled bladder.  Unremarkable CT appearance to the uterus and adnexa.  Trace free fluid.  No free intraperitoneal air.  Multilevel degenerative changes of the imaged spine. No acute or aggressive appearing osseous lesion.  IMPRESSION: Small bowel obstruction pattern with dilated loops of small bowel up to 4.1 cm.  Distal small bowel loops are decompressed. Transition point appears to be in the right lower quadrant where there is a kinked loop of small bowel with wall thickening. Infectious, inflammatory and ischemic etiologies are considerations. Appendix not identified.  Discussed via telephone with Dr. Colon Branch at 10:10 p.m. on 03/31/2011.  Original Report Authenticated By: Waneta Martins, M.D.    Review of Systems  Constitutional: Positive for  malaise/fatigue. Negative for fever and chills.  HENT: Negative.   Eyes: Negative.   Respiratory: Negative.   Cardiovascular: Negative.   Gastrointestinal: Positive for nausea, vomiting and abdominal pain (Diffuse). Negative for heartburn, diarrhea, constipation, blood in stool and melena.  Genitourinary: Negative.   Musculoskeletal: Negative.   Skin: Negative.   Neurological: Negative.   Endo/Heme/Allergies: Negative.   Psychiatric/Behavioral: Negative.    Blood pressure 145/80, pulse 71, temperature 98.1 F (36.7 Wilcox), temperature source Oral, resp. rate 18, height 5\' 4"  (1.626 m), weight 86.7 kg (191 lb 2.2 oz), SpO2 90.00%. Physical Exam  Constitutional: She is oriented to person, place, and time. She appears well-developed and well-nourished. No distress.  HENT:  Head: Normocephalic and atraumatic.  Eyes: Conjunctivae and EOM are normal. Pupils are equal, round, and reactive to light. No scleral icterus.  Neck: Normal range of motion. Neck supple. No tracheal deviation present. No thyromegaly present.  Cardiovascular: Normal rate and regular rhythm.   Respiratory: Effort normal and breath sounds normal.  GI: Soft. She exhibits distension (mild). She exhibits no mass. There is tenderness (mild difuse.  No peritoneal signs.). There is no rebound and no guarding.  Lymphadenopathy:    She has  no cervical adenopathy.  Neurological: She is alert and oriented to person, place, and time.  Skin: Skin is warm and dry.    Assessment/Plan: Small bowel obstruction.  Suspect adhesions although not a very extensive abdominal surgical history.  Continue conservative measures for now.  Indications for urgent surgical intervention discussed with patient.  Also, should she fail to progress with conservative measures, exploratory laparotomy may still be required.  Patient understands.  Continue NG decompression and bowel rest for now.   Michaela Wilcox 04/01/2011, 9:57 PM

## 2011-04-01 NOTE — Progress Notes (Signed)
Patient given care notes on SBO, fall prevention, and smoking cessation. Advance directive packets not available at this time.

## 2011-04-02 ENCOUNTER — Encounter (HOSPITAL_COMMUNITY): Payer: Self-pay | Admitting: Internal Medicine

## 2011-04-02 DIAGNOSIS — G4733 Obstructive sleep apnea (adult) (pediatric): Secondary | ICD-10-CM

## 2011-04-02 HISTORY — DX: Obstructive sleep apnea (adult) (pediatric): G47.33

## 2011-04-02 LAB — GLUCOSE, CAPILLARY
Glucose-Capillary: 130 mg/dL — ABNORMAL HIGH (ref 70–99)
Glucose-Capillary: 147 mg/dL — ABNORMAL HIGH (ref 70–99)
Glucose-Capillary: 187 mg/dL — ABNORMAL HIGH (ref 70–99)

## 2011-04-02 LAB — BASIC METABOLIC PANEL
Chloride: 104 mEq/L (ref 96–112)
GFR calc Af Amer: >90 mL/min (ref >90)
GFR calc non Af Amer: >90 mL/min (ref >90)
Glucose, Bld: 148 mg/dL — ABNORMAL HIGH (ref 70–99)
Potassium: 3.8 mEq/L (ref 3.5–5.1)
Sodium: 142 mEq/L (ref 135–145)

## 2011-04-02 LAB — CBC
Hemoglobin: 14.3 g/dL (ref 12.0–15.0)
MCH: 32.5 pg (ref 26.0–34.0)
RBC: 4.4 MIL/uL (ref 3.87–5.11)

## 2011-04-02 MED ORDER — SODIUM CHLORIDE 0.9 % IJ SOLN
INTRAMUSCULAR | Status: AC
  Administered 2011-04-02: 10 mL
  Filled 2011-04-02: qty 3

## 2011-04-02 NOTE — Progress Notes (Signed)
  Subjective: Nausea better.  No fever or chills.  Some flatus.  No BM.  No increased abd pain.  Objective: Vital signs in last 24 hours: Temp:  [97.9 F (36.6 C)-98.1 F (36.7 C)] 97.9 F (36.6 C) (12/21 0505) Pulse Rate:  [62-71] 62  (12/21 0505) Resp:  [18] 18  (12/21 0505) BP: (137-145)/(79-81) 137/79 mmHg (12/21 0505) SpO2:  [89 %-94 %] 94 % (12/21 0505) Last BM Date: 03/29/11  Intake/Output from previous day: 12/20 0701 - 12/21 0700 In: 2648 [I.V.:1248; NG/GT:1400] Out: 400 [Urine:400] Intake/Output this shift: Total I/O In: -  Out: 150 [Urine:150]  General appearance: alert and no distress GI: soft, intermittant BS.  Mild tenderness.  Mild distention.  No peritoneal signs.  Lab Results:   Basename 04/01/11 0500 03/31/11 2026  WBC 11.2* 11.7*  HGB 14.7 14.7  HCT 44.3 44.0  PLT 262 264   BMET  Basename 04/02/11 0519 04/01/11 0500  NA 142 138  K 3.8 3.3*  CL 104 96  CO2 32 33*  GLUCOSE 148* 157*  BUN 14 13  CREATININE 0.69 0.66  CALCIUM 9.3 9.9   PT/INR  Basename 04/01/11 0500  LABPROT 13.8  INR 1.04   ABG No results found for this basename: PHART:2,PCO2:2,PO2:2,HCO3:2 in the last 72 hours  Studies/Results: Ct Abdomen Pelvis W Contrast  03/31/2011  *RADIOLOGY REPORT*  Clinical Data: Abdominal pain  small bowel obstruction pattern. Transition point in the right lower quadrant.  CT ABDOMEN AND PELVIS WITH CONTRAST  Technique:  Multidetector CT imaging of the abdomen and pelvis was performed following the standard protocol during bolus administration of intravenous contrast.  Contrast: OMNIPAQUE IOHEXOL 300 MG/ML IV SOLN  Comparison: None.  Findings: Status post left mastectomy.  Normal heart size. Coronary artery calcification.  Lung bases are predominately clear.  Low attenuation of the liver without focal abnormality.  Status post cholecystectomy.  No biliary ductal dilatation.  Unremarkable spleen, pancreas, adrenal glands.  Simple cyst right  upper pole.  Otherwise, symmetric renal enhancement.  No hydronephrosis or hydroureter.  Small bowel obstruction pattern with dilated loops of small bowel up to 4.1 cm.  Distal small bowel loops are decompressed. Transition point appears to be in the right lower quadrant where there is a kinked loop of small bowel with wall thickening. Colonic diverticulosis without diverticulitis. Appendix not identified.  Thin-walled bladder.  Unremarkable CT appearance to the uterus and adnexa.  Trace free fluid.  No free intraperitoneal air.  Multilevel degenerative changes of the imaged spine. No acute or aggressive appearing osseous lesion.  IMPRESSION: Small bowel obstruction pattern with dilated loops of small bowel up to 4.1 cm.  Distal small bowel loops are decompressed. Transition point appears to be in the right lower quadrant where there is a kinked loop of small bowel with wall thickening. Infectious, inflammatory and ischemic etiologies are considerations. Appendix not identified.  Discussed via telephone with Dr. Colon Branch at 10:10 p.m. on 03/31/2011.  Original Report Authenticated By: Waneta Martins, M.D.    Anti-infectives: Anti-infectives    None      Assessment/Plan: s/p  SBO.  ?slow resolution.  Continue bowel rest for now.  Continue NG.  Still with high outpt.  If it decreases and patient has BM will clamp and trial off suction.  Until then continue to [facility].  Reassured patient.   LOS: 2 days    Michaela Wilcox C 04/02/2011

## 2011-04-02 NOTE — Progress Notes (Signed)
UR Chart Review Completed  

## 2011-04-02 NOTE — Progress Notes (Signed)
Subjective: The patient has slightly less abdominal pain. She has passed some flatus. She denies bowel movement. She says that she is hungry.  Objective: Vital signs in last 24 hours: Filed Vitals:   04/01/11 1437 04/01/11 2252 04/02/11 0505 04/02/11 1400  BP: 145/80 139/81 137/79 152/84  Pulse: 71 69 62 60  Temp: 98.1 F (36.7 C) 97.9 F (36.6 C) 97.9 F (36.6 C) 98 F (36.7 C)  TempSrc: Oral Oral Oral   Resp: 18 18 18 18   Height:      Weight:      SpO2: 90% 89% 94% 93%    Intake/Output Summary (Last 24 hours) at 04/02/11 1846 Last data filed at 04/02/11 1200  Gross per 24 hour  Intake   1000 ml  Output    750 ml  Net    250 ml    Weight change:   General: Pleasant 60 year old woman sitting up in bed, in no acute distress. Lungs: Clear to auscultation anteriorly with decreased breath sounds in the bases. Heart: S1, S2, with no murmurs rubs or gallops. Abdomen: No active bowel sounds auscultated, obese, mildly tender right mid abdomen and left lower quadrant. Mildly distended. No rigidity, no masses, no appreciable hepatosplenomegaly. The NG tube is draining bilious colored fluid. Extremities: No pedal edema. Neurologic: Alert and oriented x3.  Lab Results: Basic Metabolic Panel:  Basename 04/02/11 0519 04/01/11 0500  NA 142 138  K 3.8 3.3*  CL 104 96  CO2 32 33*  GLUCOSE 148* 157*  BUN 14 13  CREATININE 0.69 0.66  CALCIUM 9.3 9.9  MG -- --  PHOS -- --   Liver Function Tests:  Basename 04/01/11 0500 03/31/11 2026  AST 25 26  ALT 28 29  ALKPHOS 62 61  BILITOT 0.7 0.6  PROT 7.5 7.1  ALBUMIN 4.1 3.9   No results found for this basename: LIPASE:2,AMYLASE:2 in the last 72 hours No results found for this basename: AMMONIA:2 in the last 72 hours CBC:  Basename 04/02/11 0519 04/01/11 0500 03/31/11 2026  WBC 8.4 11.2* --  NEUTROABS -- -- 9.5*  HGB 14.3 14.7 --  HCT 44.4 44.3 --  MCV 100.9* 97.8 --  PLT 245 262 --   Cardiac Enzymes: No results found  for this basename: CKTOTAL:3,CKMB:3,CKMBINDEX:3,TROPONINI:3 in the last 72 hours BNP: No components found with this basename: POCBNP:3 D-Dimer: No results found for this basename: DDIMER:2 in the last 72 hours CBG:  Basename 04/02/11 1116 04/02/11 0716 04/02/11 0401 04/02/11 0013 04/01/11 2021 04/01/11 1702  GLUCAP 138* 157* 150* 187* 148* 141*   Hemoglobin A1C:  Basename 04/01/11 0228  HGBA1C 6.9*   Fasting Lipid Panel: No results found for this basename: CHOL,HDL,LDLCALC,TRIG,CHOLHDL,LDLDIRECT in the last 72 hours Thyroid Function Tests:  Basename 04/01/11 0228  TSH 19.577*  T4TOTAL --  FREET4 0.90  T3FREE --  THYROIDAB --   Anemia Panel: No results found for this basename: VITAMINB12,FOLATE,FERRITIN,TIBC,IRON,RETICCTPCT in the last 72 hours Coagulation:  Basename 04/01/11 0500  LABPROT 13.8  INR 1.04   Urine Drug Screen: Drugs of Abuse  No results found for this basename: labopia,  cocainscrnur,  labbenz,  amphetmu,  thcu,  labbarb    Alcohol Level: No results found for this basename: ETH:2 in the last 72 hours  Micro: No results found for this or any previous visit (from the past 240 hour(s)).  Studies/Results: Ct Abdomen Pelvis W Contrast  03/31/2011  *RADIOLOGY REPORT*  Clinical Data: Abdominal pain  small bowel obstruction pattern. Transition point  in the right lower quadrant.  CT ABDOMEN AND PELVIS WITH CONTRAST  Technique:  Multidetector CT imaging of the abdomen and pelvis was performed following the standard protocol during bolus administration of intravenous contrast.  Contrast: OMNIPAQUE IOHEXOL 300 MG/ML IV SOLN  Comparison: None.  Findings: Status post left mastectomy.  Normal heart size. Coronary artery calcification.  Lung bases are predominately clear.  Low attenuation of the liver without focal abnormality.  Status post cholecystectomy.  No biliary ductal dilatation.  Unremarkable spleen, pancreas, adrenal glands.  Simple cyst right upper pole.   Otherwise, symmetric renal enhancement.  No hydronephrosis or hydroureter.  Small bowel obstruction pattern with dilated loops of small bowel up to 4.1 cm.  Distal small bowel loops are decompressed. Transition point appears to be in the right lower quadrant where there is a kinked loop of small bowel with wall thickening. Colonic diverticulosis without diverticulitis. Appendix not identified.  Thin-walled bladder.  Unremarkable CT appearance to the uterus and adnexa.  Trace free fluid.  No free intraperitoneal air.  Multilevel degenerative changes of the imaged spine. No acute or aggressive appearing osseous lesion.  IMPRESSION: Small bowel obstruction pattern with dilated loops of small bowel up to 4.1 cm.  Distal small bowel loops are decompressed. Transition point appears to be in the right lower quadrant where there is a kinked loop of small bowel with wall thickening. Infectious, inflammatory and ischemic etiologies are considerations. Appendix not identified.  Discussed via telephone with Dr. Colon Branch at 10:10 p.m. on 03/31/2011.  Original Report Authenticated By: Waneta Martins, M.D.    Medications: I have reviewed the patient's current medications.  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 Small bowel obstruction. She is clinically stable. An NG tube was placed for decompression. She has copious amounts of bilious output. She is on supportive treatment with as needed analgesics, as needed antiemetics, IV fluids for hydration, and IV Protonix. The abdominal x-ray revealed moderate fecal material in the proximal colon. The CT of her abdomen reveals small bowel wall thickening at the site of the obstruction. Will hold off on starting antibiotics, however, if she becomes febrile, will start them  empirically. Dr. Illene Regulus assessment and recommendations noted and appreciated.  Hypokalemia, likely secondary to NG drainage. Repleting in the IV fluids.  Hypertension and coronary artery disease. Stable, however, the patient is off of her antiplatelet and cardiac medications.  Type 2 diabetes mellitus. She is currently being treated with sliding scale NovoLog. Will adjust it as needed.   Plan:  Continue current management.    LOS: 2 days   Denim Kalmbach 04/02/2011, 6:46 PM

## 2011-04-02 NOTE — Progress Notes (Signed)
CARE MANAGEMENT NOTE 04/02/2011  Patient:  Michaela Wilcox, Michaela Wilcox   Account Number:  [id number]  Date Initiated:  04/02/2011  Documentation initiated by:  Rosemary Holms  Subjective/Objective Assessment:   Pt admitted with abd pain.     Action/Plan:   no anticipated HH needs   Anticipated DC Date:  04/04/2011   Anticipated DC Plan:  HOME/[facility]         Choice offered to / List presented to:             Status of service:   Medicare Important Message given?   (If response is "NO", the following Medicare IM given date fields will be blank) Date Medicare IM given:   Date Additional Medicare IM given:    Discharge Disposition:    Per UR Regulation:    Comments:  04/02/11 1400 Michaela Wilcox Leanord Hawking RN BSN

## 2011-04-03 LAB — BASIC METABOLIC PANEL
Chloride: 106 mEq/L (ref 96–112)
GFR calc Af Amer: 75 mL/min — ABNORMAL LOW (ref >90)
Potassium: 4.2 mEq/L (ref 3.5–5.1)

## 2011-04-03 LAB — GLUCOSE, CAPILLARY

## 2011-04-03 MED ORDER — SODIUM CHLORIDE 0.9 % IJ SOLN
INTRAMUSCULAR | Status: AC
  Administered 2011-04-03: 10 mL
  Filled 2011-04-03: qty 3

## 2011-04-03 MED ORDER — HYDRALAZINE HCL 20 MG/ML IJ SOLN
5.0000 mg | INTRAMUSCULAR | Status: DC | PRN
  Administered 2011-04-04: 5 mg via INTRAVENOUS
  Filled 2011-04-03: qty 1

## 2011-04-03 MED ORDER — PREGABALIN 75 MG PO CAPS
75.0000 mg | ORAL_CAPSULE | Freq: Two times a day (BID) | ORAL | Status: DC
  Administered 2011-04-03: 75 mg via ORAL
  Filled 2011-04-03: qty 1

## 2011-04-03 MED ORDER — SODIUM CHLORIDE 0.9 % IJ SOLN
INTRAMUSCULAR | Status: AC
  Filled 2011-04-03: qty 3

## 2011-04-03 NOTE — Progress Notes (Signed)
  Subjective: No nausea.  No pain. NO BM but some flatus.  NG currently clamped  Objective: Vital signs in last 24 hours: Temp:  [97.4 F (36.3 C)-98 F (36.7 C)] 97.5 F (36.4 C) (12/22 0624) Pulse Rate:  [60-67] 64  (12/22 0624) Resp:  [18] 18  (12/22 0624) BP: (148-183)/(83-97) 164/83 mmHg (12/22 0624) SpO2:  [93 %-95 %] 95 % (12/22 0624) Last BM Date: 03/29/11  Intake/Output from previous day: 12/21 0701 - 12/22 0700 In: 480 [P.O.:480] Out: 2150 [Urine:950; Emesis/NG output:1200] Intake/Output this shift:    General appearance: alert and no distress GI: +BS, soft NT, ND.  Lab Results:   Basename 04/02/11 0519 04/01/11 0500  WBC 8.4 11.2*  HGB 14.3 14.7  HCT 44.4 44.3  PLT 245 262   BMET  Basename 04/03/11 0749 04/02/11 0519  NA 141 142  K 4.2 3.8  CL 106 104  CO2 29 32  GLUCOSE 135* 148*  BUN 11 14  CREATININE 0.93 0.69  CALCIUM 9.6 9.3   PT/INR  Basename 04/01/11 0500  LABPROT 13.8  INR 1.04   ABG No results found for this basename: PHART:2,PCO2:2,PO2:2,HCO3:2 in the last 72 hours  Studies/Results: No results found.  Anti-infectives: Anti-infectives    None      Assessment/Plan: s/p  SBO.  ? slow resolution.  Will trial clears and clamp NG for now.  LOS: 3 days    Naim Murtha C 04/03/2011

## 2011-04-03 NOTE — Progress Notes (Signed)
Pt NG tube clamped since 0800 this am; pt ate lunch. At 1400 complained of nausea and abdominal discomfort. Pt re-connected to NG suction; pt nausea and pain better; pt wanted to try again for supper

## 2011-04-03 NOTE — Progress Notes (Signed)
Subjective: The patient has less abdominal pain. She has passed some flatus. She denies bowel movements. No nausea.  Objective: Vital signs in last 24 hours: Filed Vitals:   04/02/11 1400 04/02/11 2300 04/03/11 0018 04/03/11 0624  BP: 152/84 183/97 148/83 164/83  Pulse: 60 67  64  Temp: 98 F (36.7 C) 97.4 F (36.3 C)  97.5 F (36.4 C)  TempSrc:  Oral  Oral  Resp: 18 18  18   Height:      Weight:      SpO2: 93% 93%  95%    Intake/Output Summary (Last 24 hours) at 04/03/11 1512 Last data filed at 04/03/11 0900  Gross per 24 hour  Intake    480 ml  Output   1800 ml  Net  -1320 ml    Weight change:   General: Pleasant 60 year old woman sitting up in bed, in no acute distress. Lungs: Clear to auscultation anteriorly with decreased breath sounds in the bases. Heart: S1, S2, with no murmurs rubs or gallops. Abdomen: No active bowel sounds auscultated, obese, mildly tender right mid abdomen and left lower quadrant. Mildly distended. No rigidity, no masses, no appreciable hepatosplenomegaly. The NG tube is draining bilious colored fluid. Extremities: No pedal edema. Neurologic: Alert and oriented x3.  Lab Results: Basic Metabolic Panel:  Basename 04/03/11 0749 04/02/11 0519  NA 141 142  K 4.2 3.8  CL 106 104  CO2 29 32  GLUCOSE 135* 148*  BUN 11 14  CREATININE 0.93 0.69  CALCIUM 9.6 9.3  MG -- --  PHOS -- --   Liver Function Tests:  Basename 04/01/11 0500 03/31/11 2026  AST 25 26  ALT 28 29  ALKPHOS 62 61  BILITOT 0.7 0.6  PROT 7.5 7.1  ALBUMIN 4.1 3.9   No results found for this basename: LIPASE:2,AMYLASE:2 in the last 72 hours No results found for this basename: AMMONIA:2 in the last 72 hours CBC:  Basename 04/02/11 0519 04/01/11 0500 03/31/11 2026  WBC 8.4 11.2* --  NEUTROABS -- -- 9.5*  HGB 14.3 14.7 --  HCT 44.4 44.3 --  MCV 100.9* 97.8 --  PLT 245 262 --   Cardiac Enzymes: No results found for this basename:  CKTOTAL:3,CKMB:3,CKMBINDEX:3,TROPONINI:3 in the last 72 hours BNP: No components found with this basename: POCBNP:3 D-Dimer: No results found for this basename: DDIMER:2 in the last 72 hours CBG:  Basename 04/03/11 1119 04/03/11 0740 04/03/11 0436 04/03/11 0016 04/02/11 2141 04/02/11 1657  GLUCAP 143* 139* 125* 140* 130* 147*   Hemoglobin A1C:  Basename 04/01/11 0228  HGBA1C 6.9*   Fasting Lipid Panel: No results found for this basename: CHOL,HDL,LDLCALC,TRIG,CHOLHDL,LDLDIRECT in the last 72 hours Thyroid Function Tests:  Basename 04/01/11 0228  TSH 19.577*  T4TOTAL --  FREET4 0.90  T3FREE --  THYROIDAB --   Anemia Panel: No results found for this basename: VITAMINB12,FOLATE,FERRITIN,TIBC,IRON,RETICCTPCT in the last 72 hours Coagulation:  Basename 04/01/11 0500  LABPROT 13.8  INR 1.04   Urine Drug Screen: Drugs of Abuse  No results found for this basename: labopia,  cocainscrnur,  labbenz,  amphetmu,  thcu,  labbarb    Alcohol Level: No results found for this basename: ETH:2 in the last 72 hours  Micro: No results found for this or any previous visit (from the past 240 hour(s)).  Studies/Results: No results found.  Medications: I have reviewed the patient's current medications.  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 Small bowel obstruction. She is clinically stable. The NG tube is being clamped and clear liquids are being started as a trial per Dr Leticia Penna. She is on supportive treatment with as needed analgesics, as needed antiemetics, IV fluids for hydration, and IV Protonix.  Hypokalemia, likely secondary to NG drainage. Repleting in the IV fluids.  Hypertension and coronary artery disease. Stable, however, the patient is off of her  antiplatelet and cardiac medications. Her blood pressure is trending up. As needed metoprolol and hydralazine have been ordered.  Type 2 diabetes mellitus. She is currently being treated with sliding scale NovoLog. Will adjust it as needed.   Plan:  Continue current management. Decrease potassium in the IV fluids if serum potassium is normal tomorrow. Restart  antihypertensives by mouth when able.    LOS: 3 days   Merica Prell 04/03/2011, 3:12 PM

## 2011-04-04 ENCOUNTER — Inpatient Hospital Stay (HOSPITAL_COMMUNITY): Payer: 59

## 2011-04-04 LAB — GLUCOSE, CAPILLARY
Glucose-Capillary: 156 mg/dL — ABNORMAL HIGH (ref 70–99)
Glucose-Capillary: 151 mg/dL — ABNORMAL HIGH (ref 70–99)
Glucose-Capillary: 116 mg/dL — ABNORMAL HIGH (ref 70–99)

## 2011-04-04 LAB — BASIC METABOLIC PANEL
BUN: 8 mg/dL (ref 6–23)
CO2: 22 mEq/L (ref 19–32)
CO2: 25 mEq/L (ref 19–32)
Chloride: 101 mEq/L (ref 96–112)
Glucose, Bld: 527 mg/dL — ABNORMAL HIGH (ref 70–99)
Glucose, Bld: 176 mg/dL — ABNORMAL HIGH (ref 70–99)
Potassium: 8.2 mEq/L (ref 3.5–5.1)
Sodium: 136 mEq/L (ref 135–145)
Sodium: 135 mEq/L (ref 135–145)

## 2011-04-04 MED ORDER — SODIUM CHLORIDE 0.9 % IJ SOLN
10.0000 mL | Freq: Two times a day (BID) | INTRAMUSCULAR | Status: DC
  Administered 2011-04-04 – 2011-04-12 (×12): 10 mL
  Filled 2011-04-04 (×6): qty 3
  Filled 2011-04-04: qty 6
  Filled 2011-04-04: qty 3

## 2011-04-04 MED ORDER — SODIUM CHLORIDE 0.9 % IJ SOLN
INTRAMUSCULAR | Status: AC
  Administered 2011-04-04: 10 mL
  Filled 2011-04-04: qty 3

## 2011-04-04 MED ORDER — ONDANSETRON HCL 4 MG/2ML IJ SOLN
4.0000 mg | Freq: Four times a day (QID) | INTRAMUSCULAR | Status: DC | PRN
  Administered 2011-04-04 – 2011-04-12 (×13): 4 mg via INTRAVENOUS
  Filled 2011-04-04 (×13): qty 2

## 2011-04-04 MED ORDER — SODIUM CHLORIDE 0.9 % IJ SOLN
10.0000 mL | INTRAMUSCULAR | Status: DC | PRN
  Administered 2011-04-08 – 2011-04-12 (×3): 10 mL
  Filled 2011-04-04 (×2): qty 3

## 2011-04-04 MED ORDER — SODIUM CHLORIDE 0.9 % IV SOLN
INTRAVENOUS | Status: DC
  Administered 2011-04-04 – 2011-04-05 (×2): via INTRAVENOUS

## 2011-04-04 NOTE — Progress Notes (Signed)
CRITICAL VALUE ALERT  Critical value received:  k 8.2 and (glucose 527 not called)  Date of notification:  04/04/11  Time of notification:  0658  Critical value read back: yes  Nurse who received alert:  Roswell Nickel RN  MD notified (1st page):  Dr. Rito Ehrlich  Time of first page:  [phone]  MD notified (2nd page):  Time of second page:  Responding MD:  Dr. Rito Ehrlich  Time MD responded:  [phone]  Lab redraw ordered

## 2011-04-04 NOTE — Progress Notes (Signed)
Chart reviewed. Patient required PICC line today due to poor peripheral IV access.  Subjective: Had pain earlier. Better after pain medication. Has had no bowel movement.  Objective: Vital signs in last 24 hours: Filed Vitals:   04/03/11 2300 04/04/11 0530 04/04/11 0650 04/04/11 1513  BP: 157/83 163/83 138/79 176/90  Pulse:  66  79  Temp:  98.6 F (37 C)  98.2 F (36.8 C)  TempSrc:  Oral  Oral  Resp:  16  14  Height:      Weight:      SpO2:  92%  93%   Weight change:   Intake/Output Summary (Last 24 hours) at 04/04/11 1532 Last data filed at 04/04/11 0900  Gross per 24 hour  Intake   5457 ml  Output   3550 ml  Net   1907 ml   Physical Exam: Gen.: Groggy. HEENT: NG tube draining bilious fluid. Lungs clear to auscultation bilaterally without wheeze rhonchi or rales Cardiovascular regular rate rhythm without murmurs gallops rubs Abdomen normal bowel sounds, soft, nontender, slightly distended. Extremities no clubbing cyanosis. Hands are edematous. Lab Results: Basic Metabolic Panel:  Lab 04/04/11 1610 04/04/11 0456  NA 135 136  K 3.8 8.2*  CL 101 108  CO2 25 22  GLUCOSE 176* 527*  BUN 9 8  CREATININE 0.59 0.57  CALCIUM 9.4 8.3*  MG -- --  PHOS -- --   Liver Function Tests:  Lab 04/01/11 0500 03/31/11 2026  AST 25 26  ALT 28 29  ALKPHOS 62 61  BILITOT 0.7 0.6  PROT 7.5 7.1  ALBUMIN 4.1 3.9   No results found for this basename: LIPASE:2,AMYLASE:2 in the last 168 hours No results found for this basename: AMMONIA:2 in the last 168 hours CBC:  Lab 04/02/11 0519 04/01/11 0500 03/31/11 2026 03/30/11 1647  WBC 8.4 11.2* -- --  NEUTROABS -- -- 9.5* 10.0*  HGB 14.3 14.7 -- --  HCT 44.4 44.3 -- --  MCV 100.9* 97.8 -- --  PLT 245 262 -- --   Cardiac Enzymes: No results found for this basename: CKTOTAL:3,CKMB:3,CKMBINDEX:3,TROPONINI:3 in the last 168 hours BNP: No results found for this basename: PROBNP:3 in the last 168 hours D-Dimer: No results found  for this basename: DDIMER:2 in the last 168 hours CBG:  Lab 04/04/11 1139 04/04/11 0658 04/04/11 0028 04/03/11 1119 04/03/11 0740 04/03/11 0436  GLUCAP 151* 156* 182* 143* 139* 125*   Hemoglobin A1C:  Lab 04/01/11 0228  HGBA1C 6.9*   Fasting Lipid Panel: No results found for this basename: CHOL,HDL,LDLCALC,TRIG,CHOLHDL,LDLDIRECT in the last 960 hours Thyroid Function Tests:  Lab 04/01/11 0228  TSH 19.577*  T4TOTAL --  FREET4 0.90  T3FREE --  THYROIDAB --   Coagulation:  Lab 04/01/11 0500  LABPROT 13.8  INR 1.04    Micro Results: No results found for this or any previous visit (from the past 240 hour(s)). Studies/Results: Chest Portable 1 View Post Insertion To Confirm Placement As Interpreted By Radiologist  04/04/2011  *RADIOLOGY REPORT*  Clinical Data: PICC placement  PORTABLE CHEST - 1 VIEW  Comparison: 03/29/2010  Findings: Right arm PICC line extends to the cavoatrial junction. Nasogastric tube extends into the stomach.  There is   early infiltrate or subsegmental atelectasis in the right lower lung. Left lung clear.  Stable mild cardiomegaly.  No effusion.  IMPRESSION:  1.  PICC line to cavoatrial junction. 2.  Nasogastric tube to stomach. 3.  Patchy infiltrate or atelectasis, right lower lung.  Original Report Authenticated  By: D. DANIEL HASSELL III, M.D.   Scheduled Meds:   . antiseptic oral rinse  15 mL Mouth Rinse q12n4p  . chlorhexidine  15 mL Mouth Rinse BID  . enoxaparin  40 mg Subcutaneous Q24H  . insulin aspart  0-15 Units Subcutaneous Q4H  . pantoprazole (PROTONIX) IV  40 mg Intravenous Q24H  . sodium chloride  10 mL Intracatheter Q12H  . sodium chloride      . sodium chloride      . sodium chloride      . DISCONTD: pregabalin  75 mg Oral BID   Continuous Infusions:   . sodium chloride 75 mL/hr at 04/04/11 0836  . 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 Medical issues stable.  LOS: 4 days   Carigan Lister L 04/04/2011, 3:32 PM

## 2011-04-04 NOTE — Progress Notes (Signed)
Patient has a NG tube in her nose at this time and patient will be monitored. Nurse made aware of status of patient.

## 2011-04-04 NOTE — Progress Notes (Signed)
  Subjective: Nausea increased.  NG returned to [facility].  Still with flatus.  No BM.  No increased abdominal pain.  Objective: Vital signs in last 24 hours: Temp:  [97.8 F (36.6 C)-98.6 F (37 C)] 98.6 F (37 C) (12/23 0530) Pulse Rate:  [59-66] 66  (12/23 0530) Resp:  [16-18] 16  (12/23 0530) BP: (133-163)/(79-89) 138/79 mmHg (12/23 0650) SpO2:  [92 %-95 %] 92 % (12/23 0530) Last BM Date: 03/29/11  Intake/Output from previous day: 12/22 0701 - 12/23 0700 In: 5457 [P.O.:960; I.V.:4377; NG/GT:120] Out: 4050 [Urine:2450; Emesis/NG output:1600] Intake/Output this shift: Total I/O In: 240 [P.O.:240] Out: -   General appearance: alert and no distress GI: +intermittant bowel sounds.  Soft, distended, no significant pain.  No peritoneal signs.    Lab Results:   [facility] 04/02/11 0519  WBC 8.4  HGB 14.3  HCT 44.4  PLT 245   BMET  Basename 04/04/11 0816 04/04/11 0456  NA 135 136  K 3.8 8.2*  CL 101 108  CO2 25 22  GLUCOSE 176* 527*  BUN 9 8  CREATININE 0.59 0.57  CALCIUM 9.4 8.3*   PT/INR No results found for this basename: LABPROT:2,INR:2 in the last 72 hours ABG No results found for this basename: PHART:2,PCO2:2,PO2:2,HCO3:2 in the last 72 hours  Studies/Results: No results found.  Anti-infectives: Anti-infectives    None      Assessment/Plan: s/p  SBO, pSBO.  Continue NG for now.  If no significant change will likely proceed to OR.  Patient upset about being here on the holiday.  Discussed with patient reason for continue inpatient management and goals.  Continue current management for now.   LOS: 4 days    Latiesha Harada C 04/04/2011

## 2011-04-05 LAB — GLUCOSE, CAPILLARY
Glucose-Capillary: 112 mg/dL — ABNORMAL HIGH (ref 70–99)
Glucose-Capillary: 121 mg/dL — ABNORMAL HIGH (ref 70–99)
Glucose-Capillary: 126 mg/dL — ABNORMAL HIGH (ref 70–99)

## 2011-04-05 MED ORDER — SODIUM CHLORIDE 0.9 % IJ SOLN
INTRAMUSCULAR | Status: AC
  Administered 2011-04-05: 10 mL
  Filled 2011-04-05: qty 3

## 2011-04-05 NOTE — Progress Notes (Signed)
No CPAP at this time pt in no distress pt will be monitored. Pt has ng tube.

## 2011-04-05 NOTE — Progress Notes (Signed)
  Subjective: No significant change.  No fever.  Still with nausea.  Some flatus.  No BM.  No abdominal pain.  Objective: Vital signs in last 24 hours: Temp:  [98.1 F (36.7 C)-98.2 F (36.8 C)] 98.1 F (36.7 C) (12/24 0452) Pulse Rate:  [79-84] 84  (12/24 0452) Resp:  [14-20] 20  (12/24 0452) BP: (131-193)/(78-90) 131/78 mmHg (12/24 0452) SpO2:  [91 %-93 %] 91 % (12/24 0452) Weight:  [83.9 kg (184 lb 15.5 oz)] 184 lb 15.5 oz (83.9 kg) (12/24 0458) Last BM Date: 03/29/11  Intake/Output from previous day: 12/23 0701 - 12/24 0700 In: 2396 [P.O.:820; I.V.:1386; NG/GT:180; IV Piggyback:10] Out: 2800 [Urine:900; Emesis/NG output:1900] Intake/Output this shift:    General appearance: alert and no distress GI: intermittant bowel sounds.  Soft.  Distended.  Mild tenderness. No peritoneal signs.  Lab Results:  No results found for this basename: WBC:2,HGB:2,HCT:2,PLT:2 in the last 72 hours BMET  [facility] 04/04/11 0816 04/04/11 0456  NA 135 136  K 3.8 8.2*  CL 101 108  CO2 25 22  GLUCOSE 176* 527*  BUN 9 8  CREATININE 0.59 0.57  CALCIUM 9.4 8.3*   PT/INR No results found for this basename: LABPROT:2,INR:2 in the last 72 hours ABG No results found for this basename: PHART:2,PCO2:2,PO2:2,HCO3:2 in the last 72 hours  Studies/Results: Chest Portable 1 View Post Insertion To Confirm Placement As Interpreted By Radiologist  04/04/2011  *RADIOLOGY REPORT*  Clinical Data: PICC placement  PORTABLE CHEST - 1 VIEW  Comparison: 03/29/2010  Findings: Right arm PICC line extends to the cavoatrial junction. Nasogastric tube extends into the stomach.  There is   early infiltrate or subsegmental atelectasis in the right lower lung. Left lung clear.  Stable mild cardiomegaly.  No effusion.  IMPRESSION:  1.  PICC line to cavoatrial junction. 2.  Nasogastric tube to stomach. 3.  Patchy infiltrate or atelectasis, right lower lung.  Original Report Authenticated By: Osa Craver, M.D.     Anti-infectives: Anti-infectives    None      Assessment/Plan: s/p  pSBO.  No progressing.  Will anticipate OR on Wednesday for exploration.  COntinnue bowel rest and NG for now.  LOS: 5 days    Sly Parlee C 04/05/2011

## 2011-04-05 NOTE — Progress Notes (Signed)
Discussed with Dr. Dian Situ Chart reviewed. Patient required PICC line today due to poor peripheral IV access.  Subjective: Had pain earlier. Better after pain medication. Has had no bowel movement.  Objective: Vital signs in last 24 hours: Filed Vitals:   04/04/11 2051 04/05/11 0452 04/05/11 0458 04/05/11 1500  BP: 149/82 131/78  121/77  Pulse: 81 84  70  Temp: 98.1 F (36.7 C) 98.1 F (36.7 C)  98 F (36.7 C)  TempSrc: Oral Oral    Resp: 16 20  16   Height:      Weight:   83.9 kg (184 lb 15.5 oz)   SpO2: 92% 91%  92%   Weight change:   Intake/Output Summary (Last 24 hours) at 04/05/11 1655 Last data filed at 04/05/11 1200  Gross per 24 hour  Intake   2876 ml  Output   3300 ml  Net   -424 ml   Physical Exam: Gen.: Groggy. HEENT: NG tube draining bilious fluid. Lungs clear to auscultation bilaterally without wheeze rhonchi or rales Cardiovascular regular rate rhythm without murmurs gallops rubs Abdomen normal bowel sounds, soft, nontender, slightly distended. Extremities no clubbing cyanosis. Hands are edematous. Lab Results: Basic Metabolic Panel:  Lab 04/04/11 1610 04/04/11 0456  NA 135 136  K 3.8 8.2*  CL 101 108  CO2 25 22  GLUCOSE 176* 527*  BUN 9 8  CREATININE 0.59 0.57  CALCIUM 9.4 8.3*  MG -- --  PHOS -- --   Liver Function Tests:  Lab 04/01/11 0500 03/31/11 2026  AST 25 26  ALT 28 29  ALKPHOS 62 61  BILITOT 0.7 0.6  PROT 7.5 7.1  ALBUMIN 4.1 3.9   No results found for this basename: LIPASE:2,AMYLASE:2 in the last 168 hours No results found for this basename: AMMONIA:2 in the last 168 hours CBC:  Lab 04/02/11 0519 04/01/11 0500 03/31/11 2026 03/30/11 1647  WBC 8.4 11.2* -- --  NEUTROABS -- -- 9.5* 10.0*  HGB 14.3 14.7 -- --  HCT 44.4 44.3 -- --  MCV 100.9* 97.8 -- --  PLT 245 262 -- --   Cardiac Enzymes: No results found for this basename: CKTOTAL:3,CKMB:3,CKMBINDEX:3,TROPONINI:3 in the last 168 hours BNP: No results found for this  basename: PROBNP:3 in the last 168 hours D-Dimer: No results found for this basename: DDIMER:2 in the last 168 hours CBG:  Lab 04/05/11 0450 04/05/11 0027 04/04/11 2049 04/04/11 1139 04/04/11 0658 04/04/11 0028  GLUCAP 121* 126* 116* 151* 156* 182*   Hemoglobin A1C:  Lab 04/01/11 0228  HGBA1C 6.9*   Fasting Lipid Panel: No results found for this basename: CHOL,HDL,LDLCALC,TRIG,CHOLHDL,LDLDIRECT in the last 960 hours Thyroid Function Tests:  Lab 04/01/11 0228  TSH 19.577*  T4TOTAL --  FREET4 0.90  T3FREE --  THYROIDAB --   Coagulation:  Lab 04/01/11 0500  LABPROT 13.8  INR 1.04    Micro Results: No results found for this or any previous visit (from the past 240 hour(s)). Studies/Results: Chest Portable 1 View Post Insertion To Confirm Placement As Interpreted By Radiologist  04/04/2011  *RADIOLOGY REPORT*  Clinical Data: PICC placement  PORTABLE CHEST - 1 VIEW  Comparison: 03/29/2010  Findings: Right arm PICC line extends to the cavoatrial junction. Nasogastric tube extends into the stomach.  There is   early infiltrate or subsegmental atelectasis in the right lower lung. Left lung clear.  Stable mild cardiomegaly.  No effusion.  IMPRESSION:  1.  PICC line to cavoatrial junction. 2.  Nasogastric tube to stomach. 3.  Patchy infiltrate or atelectasis, right lower lung.  Original Report Authenticated By: Osa Craver, M.D.   Scheduled Meds:    . antiseptic oral rinse  15 mL Mouth Rinse q12n4p  . chlorhexidine  15 mL Mouth Rinse BID  . enoxaparin  40 mg Subcutaneous Q24H  . insulin aspart  0-15 Units Subcutaneous Q4H  . pantoprazole (PROTONIX) IV  40 mg Intravenous Q24H  . sodium chloride  10 mL Intracatheter Q12H  . sodium chloride       Continuous Infusions:    . 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 Medical issues stable.   LOS: 5 days   Michaela Wilcox L 04/05/2011, 4:55 PM

## 2011-04-06 ENCOUNTER — Other Ambulatory Visit: Payer: Self-pay

## 2011-04-06 LAB — GLUCOSE, CAPILLARY
Glucose-Capillary: 119 mg/dL — ABNORMAL HIGH (ref 70–99)
Glucose-Capillary: 120 mg/dL — ABNORMAL HIGH (ref 70–99)
Glucose-Capillary: 125 mg/dL — ABNORMAL HIGH (ref 70–99)
Glucose-Capillary: 126 mg/dL — ABNORMAL HIGH (ref 70–99)
Glucose-Capillary: 135 mg/dL — ABNORMAL HIGH (ref 70–99)

## 2011-04-06 MED ORDER — DEXTROSE 5 % IV SOLN
1.0000 g | INTRAVENOUS | Status: DC
  Filled 2011-04-06: qty 1

## 2011-04-06 MED ORDER — SODIUM CHLORIDE 0.9 % IJ SOLN
INTRAMUSCULAR | Status: AC
  Administered 2011-04-06: 10 mL
  Filled 2011-04-06: qty 3

## 2011-04-06 NOTE — Progress Notes (Signed)
Discussed with Dr. Lovell Sheehan.  Subjective: No new complaints.  Objective: Vital signs in last 24 hours: Filed Vitals:   04/05/11 1757 04/05/11 1900 04/05/11 2006 04/06/11 0540  BP:   126/84 132/85  Pulse:   74 66  Temp:   97.3 F (36.3 C) 97.9 F (36.6 C)  TempSrc:   Oral Axillary  Resp:   16 20  Height:      Weight:    83.5 kg (184 lb 1.4 oz)  SpO2: 97% 93% 93% 94%   Weight change: -0.4 kg (-14.1 oz)  Intake/Output Summary (Last 24 hours) at 04/06/11 1628 Last data filed at 04/06/11 0600  Gross per 24 hour  Intake   2217 ml  Output   5100 ml  Net  -2883 ml   Physical Exam: Gen.:  more alert HEENT: NG tube draining bilious fluid. Lungs clear to auscultation bilaterally without wheeze rhonchi or rales Cardiovascular regular rate rhythm without murmurs gallops rubs Abdomen normal bowel sounds, soft, nontender, slightly distended. Extremities no clubbing cyanosis. Hands are edematous.  Lab Results: Basic Metabolic Panel:  Lab 04/04/11 1610 04/04/11 0456  NA 135 136  K 3.8 8.2*  CL 101 108  CO2 25 22  GLUCOSE 176* 527*  BUN 9 8  CREATININE 0.59 0.57  CALCIUM 9.4 8.3*  MG -- --  PHOS -- --   Liver Function Tests:  Lab 04/01/11 0500 03/31/11 2026  AST 25 26  ALT 28 29  ALKPHOS 62 61  BILITOT 0.7 0.6  PROT 7.5 7.1  ALBUMIN 4.1 3.9   No results found for this basename: LIPASE:2,AMYLASE:2 in the last 168 hours No results found for this basename: AMMONIA:2 in the last 168 hours CBC:  Lab 04/02/11 0519 04/01/11 0500 03/31/11 2026 03/30/11 1647  WBC 8.4 11.2* -- --  NEUTROABS -- -- 9.5* 10.0*  HGB 14.3 14.7 -- --  HCT 44.4 44.3 -- --  MCV 100.9* 97.8 -- --  PLT 245 262 -- --   Cardiac Enzymes: No results found for this basename: CKTOTAL:3,CKMB:3,CKMBINDEX:3,TROPONINI:3 in the last 168 hours BNP: No results found for this basename: PROBNP:3 in the last 168 hours D-Dimer: No results found for this basename: DDIMER:2 in the last 168 hours CBG:  Lab  04/06/11 1138 04/06/11 0802 04/06/11 0535 04/05/11 2340 04/05/11 2005 04/05/11 1627  GLUCAP 120* 131* 125* 126* 112* 119*   Hemoglobin A1C:  Lab 04/01/11 0228  HGBA1C 6.9*   Fasting Lipid Panel: No results found for this basename: CHOL,HDL,LDLCALC,TRIG,CHOLHDL,LDLDIRECT in the last 960 hours Thyroid Function Tests:  Lab 04/01/11 0228  TSH 19.577*  T4TOTAL --  FREET4 0.90  T3FREE --  THYROIDAB --   Coagulation:  Lab 04/01/11 0500  LABPROT 13.8  INR 1.04    Micro Results: No results found for this or any previous visit (from the past 240 hour(s)). Studies/Results: No results found. Scheduled Meds:    . antiseptic oral rinse  15 mL Mouth Rinse q12n4p  . chlorhexidine  15 mL Mouth Rinse BID  . enoxaparin  40 mg Subcutaneous Q24H  . insulin aspart  0-15 Units Subcutaneous Q4H  . pantoprazole (PROTONIX) IV  40 mg Intravenous Q24H  . sodium chloride  10 mL Intracatheter Q12H   Continuous Infusions:    . 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 Will check preoperative labs today. EKG. Medically stable. Hold Lovenox tomorrow.   LOS: 6 days   Koral Thaden L 04/06/2011, 4:28 PM

## 2011-04-06 NOTE — Progress Notes (Signed)
  Subjective: No acute changes since yesterday. No flatus or bowel movement.  Objective: Vital signs in last 24 hours: Temp:  [97.3 F (36.3 C)-98 F (36.7 C)] 97.9 F (36.6 C) (12/25 0540) Pulse Rate:  [66-74] 66  (12/25 0540) Resp:  [16-20] 20  (12/25 0540) BP: (121-132)/(77-85) 132/85 mmHg (12/25 0540) SpO2:  [92 %-97 %] 94 % (12/25 0540) Weight:  [83.5 kg (184 lb 1.4 oz)] 184 lb 1.4 oz (83.5 kg) (12/25 0540) Last BM Date: 03/29/11  Intake/Output from previous day: 12/24 0701 - 12/25 0700 In: 2937 [P.O.:960; I.V.:1727; NG/GT:240; IV Piggyback:10] Out: 5600 [Urine:1500; Emesis/NG output:4100] Intake/Output this shift:    General appearance: alert and cooperative Resp: clear to auscultation bilaterally Cardio: regular rate and rhythm, S1, S2 normal, no murmur, click, rub or gallop GI: Soft, nontender. Not significantly distended. Occasional bowel sounds heard. No rigidity noted.  Lab Results:  No results found for this basename: WBC:2,HGB:2,HCT:2,PLT:2 in the last 72 hours BMET  [facility] 04/04/11 0816 04/04/11 0456  NA 135 136  K 3.8 8.2*  CL 101 108  CO2 25 22  GLUCOSE 176* 527*  BUN 9 8  CREATININE 0.59 0.57  CALCIUM 9.4 8.3*   PT/INR No results found for this basename: LABPROT:2,INR:2 in the last 72 hours  Studies/Results: Chest Portable 1 View Post Insertion To Confirm Placement As Interpreted By Radiologist  04/04/2011  *RADIOLOGY REPORT*  Clinical Data: PICC placement  PORTABLE CHEST - 1 VIEW  Comparison: 03/29/2010  Findings: Right arm PICC line extends to the cavoatrial junction. Nasogastric tube extends into the stomach.  There is   early infiltrate or subsegmental atelectasis in the right lower lung. Left lung clear.  Stable mild cardiomegaly.  No effusion.  IMPRESSION:  1.  PICC line to cavoatrial junction. 2.  Nasogastric tube to stomach. 3.  Patchy infiltrate or atelectasis, right lower lung.  Original Report Authenticated By: Osa Craver,  M.D.    Anti-infectives: Anti-infectives    None      Assessment/Plan: Impression: Small bowel obstruction, not resolving with conservative therapy. Plan: Patient scheduled for an exploratory laparotomy by Dr. Leticia Penna tomorrow. All questions answered with the patient.  LOS: 6 days    Michaela Wilcox A 04/06/2011

## 2011-04-07 ENCOUNTER — Encounter (HOSPITAL_COMMUNITY): Payer: Self-pay | Admitting: Anesthesiology

## 2011-04-07 ENCOUNTER — Encounter (HOSPITAL_COMMUNITY)
Admission: EM | Disposition: A | Discharge status: Home / Self Care | Payer: Self-pay | Source: Home / Self Care | Attending: Internal Medicine

## 2011-04-07 ENCOUNTER — Inpatient Hospital Stay (HOSPITAL_COMMUNITY): Payer: 59 | Admitting: Anesthesiology

## 2011-04-07 ENCOUNTER — Encounter (HOSPITAL_COMMUNITY): Payer: Self-pay | Admitting: *Deleted

## 2011-04-07 HISTORY — PX: LAPAROTOMY: SHX154

## 2011-04-07 LAB — GLUCOSE, CAPILLARY
Glucose-Capillary: 83 mg/dL (ref 70–99)
Glucose-Capillary: 80 mg/dL (ref 70–99)
Glucose-Capillary: 111 mg/dL — ABNORMAL HIGH (ref 70–99)
Glucose-Capillary: 96 mg/dL (ref 70–99)

## 2011-04-07 LAB — CBC
HCT: 42.2 % (ref 36.0–46.0)
MCH: 32.3 pg (ref 26.0–34.0)
MCHC: 32.9 g/dL (ref 30.0–36.0)
MCV: 98.1 fL (ref 78.0–100.0)
RDW: 13.7 % (ref 11.5–15.5)

## 2011-04-07 LAB — BASIC METABOLIC PANEL
BUN: 14 mg/dL (ref 6–23)
CO2: 29 mEq/L (ref 19–32)
Chloride: 103 mEq/L (ref 96–112)
Creatinine, Ser: 0.64 mg/dL (ref 0.50–1.10)
GFR calc Af Amer: >90 mL/min (ref >90)
Glucose, Bld: 88 mg/dL (ref 70–99)

## 2011-04-07 LAB — SURGICAL PCR SCREEN: Staphylococcus aureus: NEGATIVE

## 2011-04-07 SURGERY — LAPAROTOMY, EXPLORATORY
Anesthesia: General | Wound class: Clean

## 2011-04-07 MED ORDER — PROPOFOL 10 MG/ML IV EMUL
INTRAVENOUS | Status: AC
  Filled 2011-04-07: qty 20

## 2011-04-07 MED ORDER — LIDOCAINE HCL 1 % IJ SOLN
INTRAMUSCULAR | Status: DC | PRN
  Administered 2011-04-07: 40 mg via INTRADERMAL

## 2011-04-07 MED ORDER — BUPIVACAINE HCL (PF) 0.5 % IJ SOLN
INTRAMUSCULAR | Status: AC
  Filled 2011-04-07: qty 30

## 2011-04-07 MED ORDER — MORPHINE SULFATE 2 MG/ML IJ SOLN
1.0000 mg | INTRAMUSCULAR | Status: DC | PRN
  Administered 2011-04-07: 1 mg via INTRAVENOUS
  Administered 2011-04-07: 2 mg via INTRAVENOUS
  Administered 2011-04-07: 1 mg via INTRAVENOUS
  Administered 2011-04-08 – 2011-04-13 (×18): 2 mg via INTRAVENOUS
  Filled 2011-04-07 (×21): qty 1

## 2011-04-07 MED ORDER — ONDANSETRON HCL 4 MG/2ML IJ SOLN: 4.0000 mg | Freq: Once | INTRAMUSCULAR | Status: DC | PRN

## 2011-04-07 MED ORDER — NEOSTIGMINE METHYLSULFATE 1 MG/ML IJ SOLN
INTRAMUSCULAR | Status: AC
  Filled 2011-04-07: qty 10

## 2011-04-07 MED ORDER — GLYCOPYRROLATE 0.2 MG/ML IJ SOLN
INTRAMUSCULAR | Status: DC | PRN
  Administered 2011-04-07: .8 mg via INTRAVENOUS

## 2011-04-07 MED ORDER — FENTANYL CITRATE 0.05 MG/ML IJ SOLN
INTRAMUSCULAR | Status: DC | PRN
  Administered 2011-04-07 (×5): 50 ug via INTRAVENOUS

## 2011-04-07 MED ORDER — GLYCOPYRROLATE 0.2 MG/ML IJ SOLN
0.2000 mg | Freq: Once | INTRAMUSCULAR | Status: AC
  Administered 2011-04-07: 0.2 mg via INTRAVENOUS

## 2011-04-07 MED ORDER — FENTANYL CITRATE 0.05 MG/ML IJ SOLN
INTRAMUSCULAR | Status: AC
  Administered 2011-04-07: 50 ug via INTRAVENOUS
  Filled 2011-04-07: qty 2

## 2011-04-07 MED ORDER — FENTANYL CITRATE 0.05 MG/ML IJ SOLN
INTRAMUSCULAR | Status: AC
  Administered 2011-04-07: 50 ug via INTRAVENOUS
  Filled 2011-04-07: qty 5

## 2011-04-07 MED ORDER — NEOSTIGMINE METHYLSULFATE 1 MG/ML IJ SOLN
INTRAMUSCULAR | Status: DC | PRN
  Administered 2011-04-07: 4 mg via INTRAVENOUS

## 2011-04-07 MED ORDER — GLYCOPYRROLATE 0.2 MG/ML IJ SOLN
INTRAMUSCULAR | Status: AC
  Filled 2011-04-07: qty 2

## 2011-04-07 MED ORDER — ONDANSETRON HCL 4 MG/2ML IJ SOLN
INTRAMUSCULAR | Status: AC
  Administered 2011-04-07: 4 mg via INTRAVENOUS
  Filled 2011-04-07: qty 2

## 2011-04-07 MED ORDER — MIDAZOLAM HCL 2 MG/2ML IJ SOLN
1.0000 mg | INTRAMUSCULAR | Status: DC | PRN
  Administered 2011-04-07: 2 mg via INTRAVENOUS

## 2011-04-07 MED ORDER — LACTATED RINGERS IV SOLN
INTRAVENOUS | Status: DC
  Administered 2011-04-07 – 2011-04-09 (×3): via INTRAVENOUS

## 2011-04-07 MED ORDER — MIDAZOLAM HCL 2 MG/2ML IJ SOLN
INTRAMUSCULAR | Status: AC
  Administered 2011-04-07: 2 mg via INTRAVENOUS
  Filled 2011-04-07: qty 2

## 2011-04-07 MED ORDER — ONDANSETRON HCL 4 MG/2ML IJ SOLN
4.0000 mg | Freq: Once | INTRAMUSCULAR | Status: AC
  Administered 2011-04-07: 4 mg via INTRAVENOUS

## 2011-04-07 MED ORDER — SODIUM CHLORIDE 0.9 % IR SOLN
Status: DC | PRN
  Administered 2011-04-07 (×2): 1000 mL

## 2011-04-07 MED ORDER — HYDROMORPHONE HCL PF 1 MG/ML IJ SOLN
INTRAMUSCULAR | Status: AC
  Administered 2011-04-07: 0.5 mg via INTRAVENOUS
  Filled 2011-04-07: qty 1

## 2011-04-07 MED ORDER — GLYCOPYRROLATE 0.2 MG/ML IJ SOLN
INTRAMUSCULAR | Status: AC
  Administered 2011-04-07: 0.2 mg via INTRAVENOUS
  Filled 2011-04-07: qty 1

## 2011-04-07 MED ORDER — HYDROMORPHONE HCL PF 1 MG/ML IJ SOLN
0.5000 mg | INTRAMUSCULAR | Status: DC | PRN
  Administered 2011-04-07 (×5): 0.5 mg via INTRAVENOUS

## 2011-04-07 MED ORDER — POTASSIUM CHLORIDE 10 MEQ/100ML IV SOLN: 10.0000 meq | INTRAVENOUS | Status: AC

## 2011-04-07 MED ORDER — ROCURONIUM BROMIDE 100 MG/10ML IV SOLN
INTRAVENOUS | Status: DC | PRN
  Administered 2011-04-07: 30 mg via INTRAVENOUS

## 2011-04-07 MED ORDER — PROPOFOL 10 MG/ML IV BOLUS
INTRAVENOUS | Status: DC | PRN
  Administered 2011-04-07: 150 mg via INTRAVENOUS
  Administered 2011-04-07: 25 mg via INTRAVENOUS

## 2011-04-07 MED ORDER — POTASSIUM CHLORIDE 10 MEQ/100ML IV SOLN
10.0000 meq | INTRAVENOUS | Status: AC
  Filled 2011-04-07: qty 100

## 2011-04-07 MED ORDER — SODIUM CHLORIDE 0.9 % IJ SOLN
INTRAMUSCULAR | Status: AC
  Administered 2011-04-07: 10 mL
  Filled 2011-04-07: qty 3

## 2011-04-07 MED ORDER — ROCURONIUM BROMIDE 50 MG/5ML IV SOLN
INTRAVENOUS | Status: AC
  Filled 2011-04-07: qty 1

## 2011-04-07 MED ORDER — SUCCINYLCHOLINE CHLORIDE 20 MG/ML IJ SOLN
INTRAMUSCULAR | Status: AC
  Filled 2011-04-07: qty 1

## 2011-04-07 MED ORDER — LIDOCAINE HCL (PF) 1 % IJ SOLN
INTRAMUSCULAR | Status: AC
  Filled 2011-04-07: qty 5

## 2011-04-07 MED ORDER — FENTANYL CITRATE 0.05 MG/ML IJ SOLN
25.0000 ug | INTRAMUSCULAR | Status: DC | PRN
  Administered 2011-04-07 (×4): 50 ug via INTRAVENOUS

## 2011-04-07 MED ORDER — LACTATED RINGERS IV SOLN
INTRAVENOUS | Status: DC
  Administered 2011-04-07: 1000 mL via INTRAVENOUS

## 2011-04-07 MED ORDER — SUCCINYLCHOLINE CHLORIDE 20 MG/ML IJ SOLN
INTRAMUSCULAR | Status: DC | PRN
  Administered 2011-04-07: 120 mg via INTRAVENOUS

## 2011-04-07 MED ORDER — POTASSIUM CHLORIDE 10 MEQ/100ML IV SOLN
10.0000 meq | INTRAVENOUS | Status: AC
  Administered 2011-04-07 (×4): 10 meq via INTRAVENOUS
  Filled 2011-04-07 (×4): qty 100

## 2011-04-07 SURGICAL SUPPLY — 54 items
APPLIER CLIP 11 MED OPEN (CLIP)
APPLIER CLIP 13 LRG OPEN (CLIP)
BAG HAMPER (MISCELLANEOUS) ×2 IMPLANT
BARRIER SKIN 2 3/4 (OSTOMY) IMPLANT
CLAMP POUCH DRAINAGE QUIET (OSTOMY) IMPLANT
CLIP APPLIE 11 MED OPEN (CLIP) IMPLANT
CLIP APPLIE 13 LRG OPEN (CLIP) IMPLANT
CLOTH BEACON ORANGE TIMEOUT ST (SAFETY) ×2 IMPLANT
COVER LIGHT HANDLE STERIS (MISCELLANEOUS) ×4 IMPLANT
DRAPE WARM FLUID 44X44 (DRAPE) ×2 IMPLANT
DURAPREP 26ML APPLICATOR (WOUND CARE) ×2 IMPLANT
ELECT BLADE 6 FLAT ULTRCLN (ELECTRODE) ×2 IMPLANT
ELECT REM PT RETURN 9FT ADLT (ELECTROSURGICAL) ×2
ELECTRODE REM PT RTRN 9FT ADLT (ELECTROSURGICAL) ×1 IMPLANT
GLOVE BIOGEL PI IND STRL 7.0 (GLOVE) ×2 IMPLANT
GLOVE BIOGEL PI IND STRL 7.5 (GLOVE) ×1 IMPLANT
GLOVE BIOGEL PI INDICATOR 7.0 (GLOVE) ×2
GLOVE BIOGEL PI INDICATOR 7.5 (GLOVE) ×1
GLOVE ECLIPSE 6.5 STRL STRAW (GLOVE) ×4 IMPLANT
GLOVE ECLIPSE 7.0 STRL STRAW (GLOVE) ×2 IMPLANT
GOWN STRL REIN XL XLG (GOWN DISPOSABLE) ×6 IMPLANT
INST SET MAJOR GENERAL (KITS) ×2 IMPLANT
KIT ROOM TURNOVER APOR (KITS) ×2 IMPLANT
MANIFOLD NEPTUNE II (INSTRUMENTS) ×2 IMPLANT
NS IRRIG 1000ML POUR BTL (IV SOLUTION) ×2 IMPLANT
PACK ABDOMINAL MAJOR (CUSTOM PROCEDURE TRAY) ×2 IMPLANT
PAD ARMBOARD 7.5X6 YLW CONV (MISCELLANEOUS) ×2 IMPLANT
POUCH OSTOMY 2 3/4  H 3804 (WOUND CARE)
POUCH OSTOMY 2 PC DRNBL 2.75 (WOUND CARE) IMPLANT
RELOAD LINEAR CUT PROX 55 BLUE (ENDOMECHANICALS) IMPLANT
RELOAD PROXIMATE 75MM BLUE (ENDOMECHANICALS) IMPLANT
RETRACTOR WND ALEXIS 25 LRG (MISCELLANEOUS) ×1 IMPLANT
RETRACTOR WOUND ALXS 34CM XLRG (MISCELLANEOUS) IMPLANT
RTRCTR WOUND ALEXIS 25CM LRG (MISCELLANEOUS) ×2
RTRCTR WOUND ALEXIS 34CM XLRG (MISCELLANEOUS)
SEALER TISSUE G2 CVD JAW 35 (ENDOMECHANICALS) IMPLANT
SEALER TISSUE G2 CVD JAW 45CM (ENDOMECHANICALS)
SET BASIN LINEN APH (SET/KITS/TRAYS/PACK) ×2 IMPLANT
SPONGE GAUZE 4X4 12PLY (GAUZE/BANDAGES/DRESSINGS) ×2 IMPLANT
STAPLER GUN LINEAR PROX 60 (STAPLE) IMPLANT
STAPLER PROXIMATE 55 BLUE (STAPLE) IMPLANT
STAPLER PROXIMATE 75MM BLUE (STAPLE) IMPLANT
STAPLER VISISTAT 35W (STAPLE) ×2 IMPLANT
SUCTION POOLE TIP (SUCTIONS) ×2 IMPLANT
SUT CHROMIC 0 SH (SUTURE) IMPLANT
SUT CHROMIC 2 0 SH (SUTURE) IMPLANT
SUT NOVA NAB GS-26 0 60 (SUTURE) ×4 IMPLANT
SUT SILK 2 0 (SUTURE)
SUT SILK 2-0 18XBRD TIE 12 (SUTURE) IMPLANT
SUT SILK 3 0 SH CR/8 (SUTURE) ×2 IMPLANT
TAPE CLOTH SURG 4X10 WHT LF (GAUZE/BANDAGES/DRESSINGS) ×2 IMPLANT
TOWEL BLUE STERILE X RAY DET (MISCELLANEOUS) IMPLANT
TOWEL OR 17X26 4PK STRL BLUE (TOWEL DISPOSABLE) ×2 IMPLANT
TRAY FOLEY CATH 14FR (SET/KITS/TRAYS/PACK) ×2 IMPLANT

## 2011-04-07 NOTE — Transfer of Care (Signed)
Immediate Anesthesia Transfer of Care Note  Patient: Michaela Wilcox  Procedure(s) Performed:  EXPLORATORY LAPAROTOMY; LYSIS OF ADHESION  Patient Location: PACU  Anesthesia Type: General  Level of Consciousness: awake, alert  and oriented  Airway & Oxygen Therapy: Patient Spontanous Breathing and Patient connected to face mask oxygen  Post-op Assessment: Report given to PACU RN  Post vital signs: Reviewed and stable  Complications: No apparent anesthesia complications

## 2011-04-07 NOTE — Progress Notes (Signed)
04/07/11 0836 patient had orders from dr ziegler to receive KCL x 4 runs IV placed this am. Received call from [facility] in [place] stating would be coming for patient in about 10 minutes, notified her of patient's K+ level 3.2 and runs ordered, but has not received any yet, also spoke with Scheryl Marten, in preop regarding orders. Stated would notify Dr Jayme Cloud, who stated ok to send patient anyway without KCL runs started yet. Paged Dr Leticia Penna to notify, left message with OR nurse.

## 2011-04-07 NOTE — Progress Notes (Signed)
04/07/11 1847 patient assisted up to bedside commode with staff assist, voided independently this evening.

## 2011-04-07 NOTE — Progress Notes (Signed)
Day of Surgery  Subjective: No significant change. Still some flatus but no bowel movement. Still with high output via the NG tube.  Objective: Vital signs in last 24 hours: Temp:  [98.3 F (36.8 C)-98.4 F (36.9 C)] 98.4 F (36.9 C) (12/26 0500) Pulse Rate:  [67-70] 70  (12/26 0500) Resp:  [18] 18  (12/26 0500) BP: (122-153)/(69-83) 153/83 mmHg (12/26 0500) SpO2:  [92 %-97 %] 97 % (12/26 0500) Weight:  [83.8 kg (184 lb 11.9 oz)] 184 lb 11.9 oz (83.8 kg) (12/26 0500) Last BM Date: 03/29/11  Intake/Output from previous day: 12/25 0701 - 12/26 0700 In: 2296 [P.O.:180; I.V.:1926; NG/GT:180; IV Piggyback:10] Out: 3000 [Urine:1200; Emesis/NG output:1800] Intake/Output this shift:    General appearance: alert and no distress Resp: clear to auscultation bilaterally Cardio: regular rate and rhythm GI: Intermittent bowel sounds, soft, obese, moderate distention, mild diffuse abdominal discomfort. No peritoneal signs.  Lab Results:   [facility] 04/07/11 0448  WBC 10.8*  HGB 13.9  HCT 42.2  PLT 219   BMET  Basename 04/07/11 0448  NA 140  K 3.2*  CL 103  CO2 29  GLUCOSE 88  BUN 14  CREATININE 0.64  CALCIUM 9.0   PT/INR No results found for this basename: LABPROT:2,INR:2 in the last 72 hours ABG No results found for this basename: PHART:2,PCO2:2,PO2:2,HCO3:2 in the last 72 hours  Studies/Results: No results found.  Anti-infectives: Anti-infectives     Start     Dose/Rate Route Frequency Ordered Stop   04/06/11 1930   cefOXitin (MEFOXIN) 1 g in dextrose 5 % 50 mL IVPB        1 g 100 mL/hr over 30 Minutes Intravenous 60 min pre-op 04/06/11 1916            Assessment/Plan: s/p Procedure(s): EXPLORATORY LAPAROTOMY Partial SBO. New significant clinical improvement. As discussed with the patient we will plan to proceed with exploratory laparotomy possible bowel resection possible ostomy. I do suspect this is related to intra-abdominal adhesions but full exploration  will be conducted as discussed with patient. Her questions and concerns were addressed. Risks benefits alternatives again were discussed with the patient and family. We will plan to proceed to the operating room as consented.  LOS: 7 days    Annahi Short C 04/07/2011

## 2011-04-07 NOTE — Anesthesia Procedure Notes (Signed)
Procedure Name: Intubation Date/Time: 04/07/2011 9:47 AM Performed by: Glynn Octave Pre-anesthesia Checklist: Patient identified, Patient being monitored, Timeout performed, Emergency Drugs available and Suction available Patient Re-evaluated:Patient Re-evaluated prior to inductionOxygen Delivery Method: Circle System Utilized Preoxygenation: Pre-oxygenation with 100% oxygen Intubation Type: IV induction, Rapid sequence and Cricoid Pressure applied Ventilation: Mask ventilation without difficulty Laryngoscope Size: Mac and 3 Grade View: Grade I Tube type: Oral Tube size: 7.0 mm Number of attempts: 1 Airway Equipment and Method: stylet Placement Confirmation: ETT inserted through vocal cords under direct vision,  positive ETCO2 and breath sounds checked- equal and bilateral Secured at: 21 cm Tube secured with: Tape Dental Injury: Teeth and Oropharynx as per pre-operative assessment

## 2011-04-07 NOTE — Progress Notes (Signed)
CPAP not needed pt will be monitored

## 2011-04-07 NOTE — Anesthesia Preprocedure Evaluation (Addendum)
Anesthesia Evaluation  Patient identified by MRN, date of birth, ID band Patient awake    Reviewed: Allergy & Precautions, H&P , NPO status , Patient's Chart, lab work & pertinent test results  History of Anesthesia Complications Negative for: history of anesthetic complications  Airway Mallampati: II TM Distance: >3 FB     Dental  (+) Teeth Intact   Pulmonary sleep apnea and Continuous Positive Airway Pressure Ventilation ,    Pulmonary exam normal       Cardiovascular hypertension, Pt. on medications - angina+ CAD Regular Normal    Neuro/Psych  Neuromuscular disease (neuropathy)    GI/Hepatic SBO   Endo/Other  Diabetes mellitus-, Well Controlled, Type 2, Oral Hypoglycemic Agents  Renal/GU      Musculoskeletal   Abdominal (+) obese,   Peds  Hematology   Anesthesia Other Findings   Reproductive/Obstetrics                           Anesthesia Physical Anesthesia Plan  ASA: III  Anesthesia Plan: General   Post-op Pain Management:    Induction: Intravenous, Rapid sequence and Cricoid pressure planned  Airway Management Planned: Oral ETT  Additional Equipment:   Intra-op Plan:   Post-operative Plan: Extubation in OR  Informed Consent:   Plan Discussed with:   Anesthesia Plan Comments:         Anesthesia Quick Evaluation

## 2011-04-07 NOTE — Op Note (Signed)
Patient:  Michaela Wilcox  DOB:  04/21/1950  MRN:  [id number]   Preop Diagnosis:  Partial small bowel structure  Postop Diagnosis:  The same  Procedure:  Exploratory laparotomy with lysis of adhesions  Surgeon:  Dr. Tilford Pillar  Anes:  General endotracheal  Indications:  Patient is a 60 year old female presented to [facility] with nausea vomiting and abdominal pain. Workup and evaluation was consistent for suspected small bowel obstruction. She did have some clinical progression and actually was passing flatus. However she did not progress beyond this point still had significant nausea. Based on these findings it was recommended that we proceed with exploratory laparotomy possible bowel resection possible ostomy. Her questions and concerns were addressed the patient was consented for the planned procedure.  Procedure note:  Patient was taken to the OR was placed in a supine position on the OR table. At this point the general anesthetic was administered and the patient was endotracheally intubated by the nurse anesthetist. At this point a Foley catheter was placed in standard sterile fashion by the operative staff. Her abdomen was prepped with DuraPrep solution. Drapes are placed in standard fashion. A midline incision was created infraumbilically little left lateral keyhole defect around the umbilicus. Additional dissection down to subcuticular tissues including the anterior fascias carried out using electrocautery. The fascia is grasped Coker clamp and elevated. The peritoneum was grasped with hemostats and entered into sharply with Metzenbaum scissors. Some green turbid fluid was encountered upon entrance into the peritoneal cavity. The peritoneum was opened both superiorly and inferiorly. A large protractor retractor was placed. Several dilated loops of small intestine were encountered and these were followed distally. At this point did encounter a single band adhesion. This was an  interloop and in the right lower quadrant near her previous appendiceal operation. The band adhesion was divided sharply with Metzenbaum scissors. At this point the bowel is free in its entirety. I did inspect the bowel from ligament of Treitz to terminal ileum. The bowel that had been tethered by the band adhesion appeared viable. There appeared to be some serosal bruising and I did opt to imbricate this with a single suture with a 3-0 silk suture. At this point I inspected the large intestine was felt normal. There for stool balls noted throughout the large intestine. The liver and spleen felt normal. The stomach was palpated and the NG tube was noted be in good position. At this point did milk some of the contents of the small intestine back towards the ligament of Treitz. Upon decompressing the small bowel I then turned my attention to closure.  The peritoneum was irrigated with sterile saline. The omentum was returned back over the small intestine. An oh looped Novafil x2 was utilized to reapproximate the fascia starting initial 1 superiorly and brought to the midportion of the incision. A second oh looped Novafil was started inferiorly and brought to the first and secured. The skin edges were then reapproximated using skin staples. The skin was washed dried moist dry towel. Sterile dressings were placed. The drapes removed the dressings were secured. The Foley catheter was removed. The patient was transferred back to the postanesthetic care unit in stable condition. At the conclusion of the procedure all instrument, sponge, needle counts are correct. The patient tolerated this injection well.   Complications:  None apparent  EBL:  Minimal  Specimen:  None

## 2011-04-07 NOTE — Anesthesia Postprocedure Evaluation (Signed)
  Anesthesia Post-op Note  Patient: Michaela Wilcox  Procedure(s) Performed:  EXPLORATORY LAPAROTOMY; LYSIS OF ADHESION  Patient Location: PACU  Anesthesia Type: General  Level of Consciousness: awake, alert  and oriented  Airway and Oxygen Therapy: Patient Spontanous Breathing and Patient connected to face mask oxygen  Post-op Pain: mild  Post-op Assessment: Post-op Vital signs reviewed, Patient's Cardiovascular Status Stable, Respiratory Function Stable and No signs of Nausea or vomiting  Post-op Vital Signs: Reviewed and stable  Complications: No apparent anesthesia complications

## 2011-04-08 LAB — GLUCOSE, CAPILLARY
Glucose-Capillary: 99 mg/dL (ref 70–99)
Glucose-Capillary: 99 mg/dL (ref 70–99)
Glucose-Capillary: 99 mg/dL (ref 70–99)

## 2011-04-08 MED ORDER — ENOXAPARIN SODIUM 40 MG/0.4ML ~~LOC~~ SOLN
40.0000 mg | SUBCUTANEOUS | Status: DC
  Administered 2011-04-08 – 2011-04-12 (×5): 40 mg via SUBCUTANEOUS
  Filled 2011-04-08 (×4): qty 0.4

## 2011-04-08 MED ORDER — HALOPERIDOL 2 MG PO TABS: 1.0000 mg | ORAL_TABLET | Freq: Every evening | ORAL | Status: DC | PRN

## 2011-04-08 MED ORDER — SODIUM CHLORIDE 0.9 % IJ SOLN
INTRAMUSCULAR | Status: AC
  Filled 2011-04-08: qty 3

## 2011-04-08 NOTE — Progress Notes (Signed)
Subjective: Passing flatus. Pain is controlled. No bowel movement. Per nursing staff. Got up to the commode without much assistance.  Objective: Vital signs in last 24 hours: Filed Vitals:   04/07/11 1400 04/07/11 1500 04/07/11 2218 04/08/11 0500  BP: 154/87 135/84 170/90 181/70  Pulse: 78 65 80 80  Temp: 98.3 F (36.8 C) 98.3 F (36.8 C) 97.5 F (36.4 C) 99.1 F (37.3 C)  TempSrc:   Oral   Resp: 16 16 16 16   Height:      Weight:      SpO2: 97% 97% 97% 96%   Weight change:   Intake/Output Summary (Last 24 hours) at 04/08/11 1028 Last data filed at 04/08/11 0900  Gross per 24 hour  Intake   2188 ml  Output   1170 ml  Net   1018 ml   Physical Exam: Gen.:  Alert oriented. HEENT: NG tube draining bilious fluid. Lungs clear to auscultation bilaterally without wheeze rhonchi or rales Cardiovascular regular rate rhythm without murmurs gallops rubs Abdomen dressing clean dry and intact. Bowel sounds present. Soft. Extremities   Lab Results: Basic Metabolic Panel:  Lab 04/07/11 4098 04/04/11 0816  NA 140 135  K 3.2* 3.8  CL 103 101  CO2 29 25  GLUCOSE 88 176*  BUN 14 9  CREATININE 0.64 0.59  CALCIUM 9.0 9.4  MG -- --  PHOS -- --   Liver Function Tests: No results found for this basename: AST:2,ALT:2,ALKPHOS:2,BILITOT:2,PROT:2,ALBUMIN:2 in the last 168 hours No results found for this basename: LIPASE:2,AMYLASE:2 in the last 168 hours No results found for this basename: AMMONIA:2 in the last 168 hours CBC:  Lab 04/07/11 0448 04/02/11 0519  WBC 10.8* 8.4  NEUTROABS -- --  HGB 13.9 14.3  HCT 42.2 44.4  MCV 98.1 100.9*  PLT 219 245   Cardiac Enzymes: No results found for this basename: CKTOTAL:3,CKMB:3,CKMBINDEX:3,TROPONINI:3 in the last 168 hours BNP: No results found for this basename: PROBNP:3 in the last 168 hours D-Dimer: No results found for this basename: DDIMER:2 in the last 168 hours CBG:  Lab 04/08/11 0724 04/08/11 0438 04/08/11 0001 04/07/11  1957 04/07/11 1627 04/07/11 1111  GLUCAP 109* 99 135* 95 96 111*   Hemoglobin A1C: No results found for this basename: HGBA1C in the last 168 hours Fasting Lipid Panel: No results found for this basename: CHOL,HDL,LDLCALC,TRIG,CHOLHDL,LDLDIRECT in the last 119 hours Thyroid Function Tests: No results found for this basename: TSH,T4TOTAL,FREET4,T3FREE,THYROIDAB in the last 168 hours Coagulation: No results found for this basename: LABPROT:4,INR:4 in the last 168 hours  Micro Results: Recent Results (from the past 240 hour(s))  SURGICAL PCR SCREEN     Status: Normal   Collection Time   04/07/11 12:31 AM      Component Value Range Status Comment   MRSA, PCR NEGATIVE  NEGATIVE  Final    Staphylococcus aureus NEGATIVE  NEGATIVE  Final    Studies/Results: No results found. Scheduled Meds:    . antiseptic oral rinse  15 mL Mouth Rinse q12n4p  . chlorhexidine  15 mL Mouth Rinse BID  . enoxaparin (LOVENOX) injection  40 mg Subcutaneous Q24H  . glycopyrrolate      . insulin aspart  0-15 Units Subcutaneous Q4H  . lidocaine      . neostigmine      . pantoprazole (PROTONIX) IV  40 mg Intravenous Q24H  . potassium chloride  10 mEq Intravenous Q1 Hr x 4  . potassium chloride  10 mEq Intravenous Q1 Hr x 4  .  propofol      . rocuronium      . sodium chloride  10 mL Intracatheter Q12H  . succinylcholine      . DISCONTD: cefOXitin  1 g Intravenous 60 min Pre-Op   Continuous Infusions:    . lactated ringers 75 mL/hr at 04/08/11 0737  . DISCONTD: sodium chloride 75 mL/hr at 04/05/11 2233  . DISCONTD: lactated ringers 1,000 mL (04/07/11 0903)   PRN Meds:.albuterol, hydrALAZINE, metoprolol, morphine injection, ondansetron (ZOFRAN) IV, phenol, promethazine, promethazine, sodium chloride, DISCONTD: fentaNYL, DISCONTD:  HYDROmorphone (DILAUDID) injection, DISCONTD: HYDROmorphone, DISCONTD: midazolam, DISCONTD: ondansetron (ZOFRAN) IV, DISCONTD: sodium chloride  irrigation Assessment/Plan: Principal Problem:  *SBO (small bowel obstruction), s/p exploratory laparotomy and lysis of adhesions. Still has NG tube in. 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  LOS: 8 days   Michaela Wilcox L 04/08/2011, 10:28 AM

## 2011-04-08 NOTE — Addendum Note (Signed)
Addendum  created 04/08/11 1349 by Minerva Areola, CRNA   Modules edited:Notes Section

## 2011-04-08 NOTE — Progress Notes (Signed)
UR Chart Review Completed UR Chart Review Completed

## 2011-04-08 NOTE — Anesthesia Postprocedure Evaluation (Signed)
Anesthesia Post Note  Patient: Michaela Wilcox  Procedure(s) Performed:  EXPLORATORY LAPAROTOMY; LYSIS OF ADHESION  Anesthesia type: General  Patient location: 307  Post pain: Pain level controlled  Post assessment: Post-op Vital signs reviewed, Patient's Cardiovascular Status Stable, Respiratory Function Stable, Patent Airway, No signs of Nausea or vomiting( N/G in place) and Pain level controlled  Last Vitals:  Filed Vitals:   04/08/11 1032  BP: 155/77  Pulse: 76  Temp: 36.7 C  Resp: 18    Post vital signs: Reviewed and stable  Level of consciousness: awake and alert   Complications: No apparent anesthesia complications

## 2011-04-08 NOTE — Progress Notes (Signed)
INITIAL ADULT NUTRITION ASSESSMENT Date: 04/08/2011   Time: 11:15 AM  Reason for Assessment: NPO/CL liq >7d  ASSESSMENT: Female 60 y.o.  Dx: SBO (small bowel obstruction)   Past Medical History  Diagnosis Date  . Coronary artery disease   . Hypertension   . High cholesterol   . Diabetes mellitus   . Cancer 7/05    breast; mastectomy, chemo and radiation  . 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 OSA on CPAP 04/02/2011    Scheduled Meds:   . antiseptic oral rinse  15 mL Mouth Rinse q12n4p  . chlorhexidine  15 mL Mouth Rinse BID  . enoxaparin (LOVENOX) injection  40 mg Subcutaneous Q24H  . glycopyrrolate      . insulin aspart  0-15 Units Subcutaneous Q4H  . lidocaine      . neostigmine      . pantoprazole (PROTONIX) IV  40 mg Intravenous Q24H  . potassium chloride  10 mEq Intravenous Q1 Hr x 4  . potassium chloride  10 mEq Intravenous Q1 Hr x 4  . propofol      . rocuronium      . sodium chloride  10 mL Intracatheter Q12H  . succinylcholine      . DISCONTD: cefOXitin  1 g Intravenous 60 min Pre-Op   Continuous Infusions:   . lactated ringers 75 mL/hr at 04/08/11 0737  . DISCONTD: sodium chloride 75 mL/hr at 04/05/11 2233  . DISCONTD: lactated ringers 1,000 mL (04/07/11 0903)   PRN Meds:.albuterol, hydrALAZINE, metoprolol, morphine injection, ondansetron (ZOFRAN) IV, phenol, promethazine, promethazine, sodium chloride, DISCONTD: fentaNYL, DISCONTD:  HYDROmorphone (DILAUDID) injection, DISCONTD: HYDROmorphone, DISCONTD: midazolam, DISCONTD: ondansetron (ZOFRAN) IV  Ht: 5\' 4"  (162.6 cm)  Wt: 184 lb 11.9 oz (83.8 kg)  Ideal Wt: 54.7 kg (120#) % Ideal Wt: 154%  Usual Wt: 195# (03-31-11 at admission).Current wt reflects 10#, 5% unplanned wt loss.   Body mass index is 31.71 kg/(m^2).  Food/Nutrition Related Hx: Pt awake with NGT to wall suction. Family present. S/p exploratory Laparotomy with lysis of adhesions 12/26. No BM. Pt meets criteria for severe malnutrition  in context of acute illness given her wt loss>2% in 1 week and < 50% of est energy requirements met > 5d.   CMP     Component Value Date/Time   NA 140 04/07/2011 0448   K 3.2* 04/07/2011 0448   CL 103 04/07/2011 0448   CO2 29 04/07/2011 0448   GLUCOSE 88 04/07/2011 0448   BUN 14 04/07/2011 0448   CREATININE 0.64 04/07/2011 0448   CALCIUM 9.0 04/07/2011 0448   PROT 7.5 04/01/2011 0500   ALBUMIN 4.1 04/01/2011 0500   AST 25 04/01/2011 0500   ALT 28 04/01/2011 0500   ALKPHOS 62 04/01/2011 0500   BILITOT 0.7 04/01/2011 0500   GFRNONAA >90 04/07/2011 0448   GFRAA >90 04/07/2011 0448   CBC    Component Value Date/Time   WBC 10.8* 04/07/2011 0448   RBC 4.30 04/07/2011 0448   HGB 13.9 04/07/2011 0448   HCT 42.2 04/07/2011 0448   PLT 219 04/07/2011 0448   MCV 98.1 04/07/2011 0448   MCH 32.3 04/07/2011 0448   MCHC 32.9 04/07/2011 0448   RDW 13.7 04/07/2011 0448   LYMPHSABS 1.3 03/31/2011 2026   MONOABS 0.9 03/31/2011 2026   EOSABS 0.0 03/31/2011 2026   BASOSABS 0.0 03/31/2011 2026   CBG (last 3)   Basename 04/08/11 0724 04/08/11 0438 04/08/11 0001  GLUCAP 109* 99 135*  Intake/Output Summary (Last 24 hours) at 04/08/11 1129 Last data filed at 04/08/11 0900  Gross per 24 hour  Intake   1588 ml  Output    800 ml  Net    788 ml   I/O last 3 completed shifts: In: 4783 [P.O.:210; I.V.:3983; NG/GT:180; IV Piggyback:410] Out: 3750 [Urine:1550; Emesis/NG output:2180; Blood:20]  Diet Order:  NPO  Supplements/Tube Feeding: none at this time IVF:    lactated ringers Last Rate: 75 mL/hr at 04/08/11 0737  DISCONTD: sodium chloride Last Rate: 75 mL/hr at 04/05/11 2233  DISCONTD: lactated ringers Last Rate: 1,000 mL (04/07/11 0903)    Estimated Nutritional Needs:   Kcal:1400-1600 Protein:80-90 gr Fluid:1.5-1.6 L/d  NUTRITION DIAGNOSIS: -Malnutrition (NI-5.2).  Status: Ongoing  RELATED TO: altered GI function  AS EVIDENCE BY: SBO, NPO/Cl liq day-8  GOAL: Pt will  successfully adv diet and consume oral intake to meet >90% of est nutritional needs  MONITORING: Diet advancement/tol, meal and suppl intake %, wt status and labs  EDUCATION NEEDS: -Education needs addressed  INTERVENTION:  Rec when pt is advanced to clear liquids; Add Resource Breeze BID and ProStat 30 ml TID    Dietitian [phone]  DOCUMENTATION CODES Per approved criteria  -Severe malnutrition in the context of acute illness or injury    Michaela Wilcox 04/08/2011, 11:15 AM

## 2011-04-08 NOTE — Progress Notes (Signed)
1 Day Post-Op  Subjective: Pain controlled.  Denies nausea.  No fever or chills.  +flatus.  No BM.  Objective: Vital signs in last 24 hours: Temp:  [97.5 F (36.4 C)-99.1 F (37.3 C)] 98.8 F (37.1 C) (12/27 1800) Pulse Rate:  [75-80] 78  (12/27 1800) Resp:  [16-18] 18  (12/27 1800) BP: (154-181)/(70-90) 180/81 mmHg (12/27 1800) SpO2:  [96 %-99 %] 99 % (12/27 1800) Last BM Date: 03/29/11  Intake/Output from previous day: 12/26 0701 - 12/27 0700 In: 2487 [P.O.:30; I.V.:2057; IV Piggyback:400] Out: 750 [Urine:350; Emesis/NG output:380; Blood:20] Intake/Output this shift:    General appearance: alert and no distress GI: +intermittent BS, soft, mild distention.  Expected tenderness.  Dressing c/d/i.  Lab Results:   [facility] 04/07/11 0448  WBC 10.8*  HGB 13.9  HCT 42.2  PLT 219   BMET  Basename 04/07/11 0448  NA 140  K 3.2*  CL 103  CO2 29  GLUCOSE 88  BUN 14  CREATININE 0.64  CALCIUM 9.0   PT/INR No results found for this basename: LABPROT:2,INR:2 in the last 72 hours ABG No results found for this basename: PHART:2,PCO2:2,PO2:2,HCO3:2 in the last 72 hours  Studies/Results: No results found.  Anti-infectives: Anti-infectives     Start     Dose/Rate Route Frequency Ordered Stop   04/06/11 1930   cefOXitin (MEFOXIN) 1 g in dextrose 5 % 50 mL IVPB  Status:  Discontinued        1 g 100 mL/hr over 30 Minutes Intravenous 60 min pre-op 04/06/11 1916 04/07/11 1228          Assessment/Plan: s/p Procedure(s): EXPLORATORY LAPAROTOMY LYSIS OF ADHESION Clamp NG.  Await increased GI function prior to advancement of diet.  Increase activity.    LOS: 8 days    Toluwanimi Radebaugh C 04/08/2011

## 2011-04-09 LAB — CBC
HCT: 35.8 % — ABNORMAL LOW (ref 36.0–46.0)
Hemoglobin: 12.3 g/dL (ref 12.0–15.0)
MCV: 97 fL (ref 78.0–100.0)
RBC: 3.69 MIL/uL — ABNORMAL LOW (ref 3.87–5.11)
RDW: 13.3 % (ref 11.5–15.5)
WBC: 10.2 10*3/uL (ref 4.0–10.5)

## 2011-04-09 LAB — BASIC METABOLIC PANEL
BUN: 8 mg/dL (ref 6–23)
CO2: 26 mEq/L (ref 19–32)
Chloride: 99 mEq/L (ref 96–112)
Creatinine, Ser: 0.5 mg/dL (ref 0.50–1.10)
GFR calc Af Amer: >90 mL/min (ref >90)
Potassium: 3.7 mEq/L (ref 3.5–5.1)

## 2011-04-09 LAB — GLUCOSE, CAPILLARY
Glucose-Capillary: 125 mg/dL — ABNORMAL HIGH (ref 70–99)
Glucose-Capillary: 141 mg/dL — ABNORMAL HIGH (ref 70–99)

## 2011-04-09 MED ORDER — PRO-STAT SUGAR FREE PO LIQD
30.0000 mL | Freq: Three times a day (TID) | ORAL | Status: DC
  Administered 2011-04-09 – 2011-04-11 (×5): 30 mL via ORAL
  Filled 2011-04-09 (×2): qty 30

## 2011-04-09 MED ORDER — SODIUM CHLORIDE 0.9 % IJ SOLN
INTRAMUSCULAR | Status: AC
  Administered 2011-04-09: 15:00:00
  Filled 2011-04-09: qty 6

## 2011-04-09 MED ORDER — SODIUM CHLORIDE 0.9 % IJ SOLN
INTRAMUSCULAR | Status: AC
  Administered 2011-04-09: 15:00:00
  Filled 2011-04-09: qty 3

## 2011-04-09 MED ORDER — SODIUM CHLORIDE 0.9 % IJ SOLN
INTRAMUSCULAR | Status: AC
  Administered 2011-04-09: 15:00:00
  Filled 2011-04-09: qty 9

## 2011-04-09 MED ORDER — BOOST / RESOURCE BREEZE PO LIQD
1.0000 | Freq: Three times a day (TID) | ORAL | Status: DC
  Administered 2011-04-09 – 2011-04-11 (×4): 1 via ORAL
  Filled 2011-04-09 (×15): qty 1

## 2011-04-09 NOTE — Progress Notes (Signed)
The patient has been transferred to Dr. Illene Regulus service. If the hospitalist service is needed, please feel free to consult or notify us.Marland Kitchen

## 2011-04-09 NOTE — Progress Notes (Signed)
2 Days Post-Op  Subjective: No nausea or vomiting. Continues to have flatus. No bowel movement.  No fever or chills. No significant pain. She is ambulatory.  Objective: Vital signs in last 24 hours: Temp:  [97.9 F (36.6 C)-99 F (37.2 C)] 97.9 F (36.6 C) (12/28 0552) Pulse Rate:  [75-80] 78  (12/28 0552) Resp:  [18] 18  (12/28 0552) BP: (154-180)/(77-91) 178/91 mmHg (12/28 0552) SpO2:  [97 %-99 %] 97 % (12/28 0552) Last BM Date: 03/29/11  Intake/Output from previous day: 12/27 0701 - 12/28 0700 In: 1659 [I.V.:1658] Out: 2800 [Urine:1100; Emesis/NG output:1700] Intake/Output this shift:    General appearance: alert and no distress GI: Positive bowel sounds, soft, expected tenderness. No peritoneal signs. Incision is clean dry and intact.  Lab Results:   [facility] 04/07/11 0448  WBC 10.8*  HGB 13.9  HCT 42.2  PLT 219   BMET  Basename 04/07/11 0448  NA 140  K 3.2*  CL 103  CO2 29  GLUCOSE 88  BUN 14  CREATININE 0.64  CALCIUM 9.0   PT/INR No results found for this basename: LABPROT:2,INR:2 in the last 72 hours ABG No results found for this basename: PHART:2,PCO2:2,PO2:2,HCO3:2 in the last 72 hours  Studies/Results: No results found.  Anti-infectives: Anti-infectives     Start     Dose/Rate Route Frequency Ordered Stop   04/06/11 1930   cefOXitin (MEFOXIN) 1 g in dextrose 5 % 50 mL IVPB  Status:  Discontinued        1 g 100 mL/hr over 30 Minutes Intravenous 60 min pre-op 04/06/11 1916 04/07/11 1228          Assessment/Plan: s/p Procedure(s): EXPLORATORY LAPAROTOMY LYSIS OF ADHESION Nasogastric tube has been discontinued. At this point we'll start clear liquid diet as tolerated. Son gastric folds. Indications for discharge were discussed at length with the patient including normal bowel function, pain controlled with oral pain medication, and tolerating a regular diet. A full discharge in the next 24-48 hours depending on progression. Dr. Lovell Sheehan  covering this weekend.  LOS: 9 days    Michaela Wilcox C 04/09/2011

## 2011-04-10 LAB — GLUCOSE, CAPILLARY
Glucose-Capillary: 135 mg/dL — ABNORMAL HIGH (ref 70–99)
Glucose-Capillary: 141 mg/dL — ABNORMAL HIGH (ref 70–99)

## 2011-04-10 LAB — CBC
HCT: 38.2 % (ref 36.0–46.0)
MCV: 96 fL (ref 78.0–100.0)
Platelets: 243 10*3/uL (ref 150–400)
RBC: 3.98 MIL/uL (ref 3.87–5.11)
RDW: 13.3 % (ref 11.5–15.5)
WBC: 11.3 10*3/uL — ABNORMAL HIGH (ref 4.0–10.5)

## 2011-04-10 LAB — BASIC METABOLIC PANEL
BUN: 7 mg/dL (ref 6–23)
CO2: 30 mEq/L (ref 19–32)
Chloride: 98 mEq/L (ref 96–112)
GFR calc Af Amer: >90 mL/min (ref >90)
Potassium: 3.2 mEq/L — ABNORMAL LOW (ref 3.5–5.1)

## 2011-04-10 MED ORDER — SODIUM CHLORIDE 0.9 % IJ SOLN
INTRAMUSCULAR | Status: AC
  Administered 2011-04-10: 17:00:00
  Filled 2011-04-10: qty 3

## 2011-04-10 MED ORDER — ALUM & MAG HYDROXIDE-SIMETH 200-200-20 MG/5ML PO SUSP
30.0000 mL | Freq: Four times a day (QID) | ORAL | Status: DC | PRN
  Administered 2011-04-10 – 2011-04-12 (×4): 30 mL via ORAL
  Filled 2011-04-10 (×5): qty 30

## 2011-04-10 MED ORDER — KCL IN DEXTROSE-NACL 40-5-0.45 MEQ/L-%-% IV SOLN
INTRAVENOUS | Status: DC
  Administered 2011-04-10 – 2011-04-12 (×2): via INTRAVENOUS
  Administered 2011-04-13: 1000 mL via INTRAVENOUS

## 2011-04-10 NOTE — Progress Notes (Signed)
3 Days Post-Op  Subjective: Passing flatus. No emesis noted. Tolerating clear liquid diet well.  Objective: Vital signs in last 24 hours: Temp:  [98 F (36.7 C)-98.4 F (36.9 C)] 98.3 F (36.8 C) (12/29 0603) Pulse Rate:  [75-84] 84  (12/29 0603) Resp:  [18] 18  (12/29 0603) BP: (112-150)/(68-81) 112/68 mmHg (12/29 0603) SpO2:  [91 %-95 %] 94 % (12/29 0603) Last BM Date: 03/29/11  Intake/Output from previous day: 12/28 0701 - 12/29 0700 In: 1910 [P.O.:1910] Out: 500 [Urine:500] Intake/Output this shift:    General appearance: alert, cooperative and no distress Resp: clear to auscultation bilaterally Cardio: regular rate and rhythm, S1, S2 normal, no murmur, click, rub or gallop GI: Soft. Incision healing well. Positive bowel sounds heard.  Lab Results:   [facility] 04/10/11 0650 04/09/11 0940  WBC 11.3* 10.2  HGB 13.1 12.3  HCT 38.2 35.8*  PLT 243 177   BMET  Basename 04/10/11 0650 04/09/11 0809  NA 136 136  K 3.2* 3.7  CL 98 99  CO2 30 26  GLUCOSE 139* 85  BUN 7 8  CREATININE 0.49* 0.50  CALCIUM 8.9 9.1   PT/INR No results found for this basename: LABPROT:2,INR:2 in the last 72 hours  Studies/Results: No results found.  Anti-infectives: Anti-infectives     Start     Dose/Rate Route Frequency Ordered Stop   04/06/11 1930   cefOXitin (MEFOXIN) 1 g in dextrose 5 % 50 mL IVPB  Status:  Discontinued        1 g 100 mL/hr over 30 Minutes Intravenous 60 min pre-op 04/06/11 1916 04/07/11 1228          Assessment/Plan: s/p Procedure(s): EXPLORATORY LAPAROTOMY LYSIS OF ADHESION Impression: Recovering well from surgery. Awaiting full return of bowel function. Plan: Adjust IV fluids. Advance to full liquid diet. Ambulate in hallway.  LOS: 10 days    Acelyn Basham A 04/10/2011

## 2011-04-11 LAB — GLUCOSE, CAPILLARY
Glucose-Capillary: 147 mg/dL — ABNORMAL HIGH (ref 70–99)
Glucose-Capillary: 161 mg/dL — ABNORMAL HIGH (ref 70–99)
Glucose-Capillary: 153 mg/dL — ABNORMAL HIGH (ref 70–99)
Glucose-Capillary: 152 mg/dL — ABNORMAL HIGH (ref 70–99)
Glucose-Capillary: 132 mg/dL — ABNORMAL HIGH (ref 70–99)

## 2011-04-11 LAB — CBC
HCT: 37.8 % (ref 36.0–46.0)
Hemoglobin: 13.1 g/dL (ref 12.0–15.0)
MCH: 33.3 pg (ref 26.0–34.0)
MCHC: 34.7 g/dL (ref 30.0–36.0)
MCV: 96.2 fL (ref 78.0–100.0)
RDW: 13.3 % (ref 11.5–15.5)

## 2011-04-11 LAB — BASIC METABOLIC PANEL
BUN: 6 mg/dL (ref 6–23)
Calcium: 8.9 mg/dL (ref 8.4–10.5)
Creatinine, Ser: 0.51 mg/dL (ref 0.50–1.10)
GFR calc non Af Amer: >90 mL/min (ref >90)
Glucose, Bld: 176 mg/dL — ABNORMAL HIGH (ref 70–99)

## 2011-04-11 MED ORDER — LEVOTHYROXINE SODIUM 25 MCG PO TABS
125.0000 ug | ORAL_TABLET | Freq: Every day | ORAL | Status: DC
  Administered 2011-04-12 – 2011-04-13 (×2): 125 ug via ORAL
  Filled 2011-04-11 (×2): qty 1

## 2011-04-11 MED ORDER — PROMETHAZINE HCL 25 MG/ML IJ SOLN
12.5000 mg | Freq: Once | INTRAMUSCULAR | Status: AC
  Administered 2011-04-11: 12.5 mg via INTRAVENOUS

## 2011-04-11 MED ORDER — SODIUM CHLORIDE 0.9 % IJ SOLN
INTRAMUSCULAR | Status: AC
  Administered 2011-04-11: 18:00:00
  Filled 2011-04-11: qty 3

## 2011-04-11 MED ORDER — POTASSIUM CHLORIDE 10 MEQ/100ML IV SOLN
10.0000 meq | INTRAVENOUS | Status: AC
  Administered 2011-04-11 (×5): 10 meq via INTRAVENOUS
  Filled 2011-04-11: qty 400

## 2011-04-11 MED ORDER — GLIPIZIDE 5 MG PO TABS
10.0000 mg | ORAL_TABLET | Freq: Two times a day (BID) | ORAL | Status: DC
  Administered 2011-04-11 – 2011-04-13 (×3): 10 mg via ORAL
  Filled 2011-04-11 (×3): qty 2

## 2011-04-11 MED ORDER — PANTOPRAZOLE SODIUM 40 MG PO TBEC
40.0000 mg | DELAYED_RELEASE_TABLET | Freq: Every day | ORAL | Status: DC
  Administered 2011-04-11 – 2011-04-12 (×2): 40 mg via ORAL
  Filled 2011-04-11: qty 1

## 2011-04-11 MED ORDER — SODIUM CHLORIDE 0.9 % IJ SOLN
INTRAMUSCULAR | Status: AC
  Administered 2011-04-11: 11:00:00
  Filled 2011-04-11: qty 3

## 2011-04-11 MED ORDER — MAGNESIUM HYDROXIDE 400 MG/5ML PO SUSP
30.0000 mL | Freq: Three times a day (TID) | ORAL | Status: DC | PRN
  Administered 2011-04-11: 30 mL via ORAL
  Filled 2011-04-11: qty 30

## 2011-04-11 NOTE — Progress Notes (Signed)
4 Days Post-Op  Subjective: Feeling better this [name] She reports having Wilcox small bowel movement. Emesis has resolved.  Objective: Vital signs in last 24 hours: Temp:  [98.3 F (36.8 C)-98.6 F (37 C)] 98.5 F (36.9 C) (12/30 0526) Pulse Rate:  [74-80] 74  (12/30 0526) Resp:  [18] 18  (12/30 0526) BP: (94-129)/(58-73) 94/58 mmHg (12/30 0526) SpO2:  [92 %-94 %] 93 % (12/30 0526) Last BM Date: 03/29/11  Intake/Output from previous day: 12/29 0701 - 12/30 0700 In: 840 [P.O.:840] Out: 750 [Urine:750] Intake/Output this shift:    General appearance: alert, cooperative and no distress Resp: clear to auscultation bilaterally Cardio: regular rate and rhythm, S1, S2 normal, no murmur, click, rub or gallop GI: Soft. Incision healing well. Bowel sounds heard.  Lab Results:   [facility] 04/11/11 0646 04/10/11 0650  WBC 8.8 11.3*  HGB 13.1 13.1  HCT 37.8 38.2  PLT 267 243   BMET  Basename 04/11/11 0646 04/10/11 0650  NA 135 136  K 3.2* 3.2*  CL 97 98  CO2 34* 30  GLUCOSE 176* 139*  BUN 6 7  CREATININE 0.51 0.49*  CALCIUM 8.9 8.9   PT/INR No results found for this basename: LABPROT:2,INR:2 in the last 72 hours  Studies/Results: No results found.  Anti-infectives: Anti-infectives     Start     Dose/Rate Route Frequency Ordered Stop   04/06/11 1930   cefOXitin (MEFOXIN) 1 g in dextrose 5 % 50 mL IVPB  Status:  Discontinued        1 g 100 mL/hr over 30 Minutes Intravenous 60 min pre-op 04/06/11 1916 04/07/11 1228          Assessment/Plan: s/p Procedure(s): EXPLORATORY LAPAROTOMY LYSIS OF ADHESION Impression: Bowel function has returned. She still has some hypokalemia. Plan: We'll advance to modified carb diet. Will supplement potassium. Anticipate discharge in next 24-48 hours. Will restart glipizide.  LOS: 11 days    Michaela Wilcox 04/11/2011

## 2011-04-12 ENCOUNTER — Encounter (HOSPITAL_COMMUNITY): Payer: Self-pay | Admitting: General Surgery

## 2011-04-12 LAB — BASIC METABOLIC PANEL
CO2: 33 mEq/L — ABNORMAL HIGH (ref 19–32)
Calcium: 8.9 mg/dL (ref 8.4–10.5)
Chloride: 101 mEq/L (ref 96–112)
Potassium: 3.9 mEq/L (ref 3.5–5.1)
Sodium: 139 mEq/L (ref 135–145)

## 2011-04-12 LAB — CBC
MCH: 32.9 pg (ref 26.0–34.0)
Platelets: 339 10*3/uL (ref 150–400)
RBC: 4.16 MIL/uL (ref 3.87–5.11)
WBC: 8.9 10*3/uL (ref 4.0–10.5)

## 2011-04-12 LAB — GLUCOSE, CAPILLARY
Glucose-Capillary: 174 mg/dL — ABNORMAL HIGH (ref 70–99)
Glucose-Capillary: 103 mg/dL — ABNORMAL HIGH (ref 70–99)
Glucose-Capillary: 87 mg/dL (ref 70–99)

## 2011-04-12 MED ORDER — ALUM & MAG HYDROXIDE-SIMETH 200-200-20 MG/5ML PO SUSP
30.0000 mL | ORAL | Status: DC | PRN
  Administered 2011-04-12 (×2): 30 mL via ORAL
  Filled 2011-04-12 (×2): qty 30

## 2011-04-12 NOTE — Progress Notes (Signed)
CARE MANAGEMENT NOTE 04/12/2011  Patient:  Michaela Wilcox, Michaela Wilcox   Account Number:  [id number]  Date Initiated:  04/02/2011  Documentation initiated by:  Rosemary Holms  Subjective/Objective Assessment:   Pt admitted with abd pain.     Action/Plan:   no anticipated HH needs   Anticipated DC Date:  04/14/2011   Anticipated DC Plan:  HOME W HOME HEALTH SERVICES      DC Planning Services  CM consult      Choice offered to / List presented to:  C-1 Patient           Status of service:   Medicare Important Message given?   (If response is "NO", the following Medicare IM given date fields will be blank) Date Medicare IM given:   Date Additional Medicare IM given:    Discharge Disposition:    Per UR Regulation:    Comments:  04/12/11 1420 Mory Herrman Leanord Hawking RN BSN Spoke with patient. She  states she could benefit from a rolling walker to assist her to the bathroom.  04/08/2011 Mendel Corning rn bsn pt had abd surgery on 12/26 now with ng tube spoke to pt,son and and her sister. pt will return home son lives with her but will go back to work on [address]. pt feels friends and neighbors will check on her may need hh referral 04/02/11 1400 Rosina Cressler [employer] RN BSN

## 2011-04-13 LAB — CBC
MCH: 33.2 pg (ref 26.0–34.0)
MCV: 98.7 fL (ref 78.0–100.0)
Platelets: 327 10*3/uL (ref 150–400)
RBC: 3.83 MIL/uL — ABNORMAL LOW (ref 3.87–5.11)

## 2011-04-13 LAB — BASIC METABOLIC PANEL
CO2: 28 mEq/L (ref 19–32)
Calcium: 8.9 mg/dL (ref 8.4–10.5)
Creatinine, Ser: 0.56 mg/dL (ref 0.50–1.10)
Glucose, Bld: 289 mg/dL — ABNORMAL HIGH (ref 70–99)

## 2011-04-13 LAB — GLUCOSE, CAPILLARY
Glucose-Capillary: 124 mg/dL — ABNORMAL HIGH (ref 70–99)
Glucose-Capillary: 162 mg/dL — ABNORMAL HIGH (ref 70–99)

## 2011-04-13 MED ORDER — HYDROCODONE-ACETAMINOPHEN 5-325 MG PO TABS: 1.0000 | ORAL_TABLET | Freq: Four times a day (QID) | ORAL | Status: DC | PRN

## 2011-04-13 NOTE — Discharge Summary (Signed)
Physician Discharge Summary  Patient ID: Michaela Wilcox MRN: [id number] DOB/AGE: 62-Jun-1952 62 y.o.  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  CT scan showed small bowel obstruction.  Despite conservative management, bowel obstruction did not resolve.  Surgery was consulted and the patient underwent lysis of adhesions by Dr. Tilford Pillar on 04/07/11.  She tolerated the procedure well.  Diet was advanced once her bowel function returned.  She is being discharged home in good and improving condition.  Consults: general surgery  Significant Diagnostic Studies: radiology: CT scan: see chart  Treatments: surgery: Exploratory laparotomy, lysis of adhesions on 04/07/11.  Discharge Exam: Blood pressure 125/85, pulse 84, temperature 97.9 F (36.6 C), temperature source Oral, resp. rate 18, height 5\' 4"  (1.626 m), weight 83.8 kg (184 lb 11.9 oz), SpO2 100.00%. General appearance: alert, cooperative and no distress Resp: clear to auscultation bilaterally Cardio: regular rate and rhythm, S1, S2 normal, no murmur, click, rub or gallop GI: soft, nontender, nondistended.  Incision healing well.  Disposition: Home  Medication List  As of 04/13/2011  9:39 AM   START taking these medications         HYDROcodone-acetaminophen 5-325 MG per tablet   Commonly known as: NORCO   Take 1-2 tablets by mouth every 6 (six) hours as needed for pain.         CONTINUE taking these medications         aspirin EC 81 MG tablet      clopidogrel 75 MG tablet   Commonly known as: PLAVIX      colesevelam 625 MG tablet   Commonly known as: WELCHOL      glipiZIDE 10 MG tablet   Commonly known as: GLUCOTROL      isosorbide mononitrate 60 MG 24 hr tablet   Commonly known as: IMDUR      levothyroxine 125 MCG tablet   Commonly known as: SYNTHROID, LEVOTHROID      losartan 50 MG tablet   Commonly known as: COZAAR      naproxen sodium 220 MG tablet   Commonly known as: ANAPROX      omeprazole 20 MG capsule   Commonly known as: PRILOSEC      pregabalin 75 MG capsule   Commonly known as: LYRICA      rosuvastatin 20 MG tablet   Commonly known as: CRESTOR      TOVIAZ 8 MG Tb24   Generic drug: Fesoterodine Fumarate         STOP taking these medications         promethazine 25 MG tablet          Where to get your medications    These are the prescriptions that you need to pick up.   You may get these medications from any pharmacy.         HYDROcodone-acetaminophen 5-325 MG per tablet           Follow-up Information    Follow up with Fabio Bering, MD. Make an appointment on 04/20/2011.   Contact information:   [address] [employer]  [place] [address] [phone]          Signed: Franky Macho A 04/13/2011, 9:39 AM

## 2011-04-13 NOTE — Progress Notes (Signed)
Pt discharged home via family; Pt and family given and explained all discharge instructions, carenotes, and prescriptions; pt and family stated understanding and denied questions/concerns; all f/u appointments in place; IV removed without complicaitons; pt stable at time of discharge  

## 2011-10-08 ENCOUNTER — Emergency Department (HOSPITAL_COMMUNITY)
Admission: EM | Admit: 2011-10-08 | Discharge: 2011-10-08 | Disposition: A | Discharge status: Home / Self Care | Payer: 59 | Attending: Emergency Medicine | Admitting: Emergency Medicine

## 2011-10-08 ENCOUNTER — Emergency Department (HOSPITAL_COMMUNITY): Payer: 59

## 2011-10-08 ENCOUNTER — Encounter (HOSPITAL_COMMUNITY): Payer: Self-pay | Admitting: Emergency Medicine

## 2011-10-08 DIAGNOSIS — X58XXXA Exposure to other specified factors, initial encounter: Secondary | ICD-10-CM | POA: Insufficient documentation

## 2011-10-08 DIAGNOSIS — Z853 Personal history of malignant neoplasm of breast: Secondary | ICD-10-CM | POA: Insufficient documentation

## 2011-10-08 DIAGNOSIS — I10 Essential (primary) hypertension: Secondary | ICD-10-CM | POA: Insufficient documentation

## 2011-10-08 DIAGNOSIS — F17200 Nicotine dependence, unspecified, uncomplicated: Secondary | ICD-10-CM | POA: Insufficient documentation

## 2011-10-08 DIAGNOSIS — I2510 Atherosclerotic heart disease of native coronary artery without angina pectoris: Secondary | ICD-10-CM | POA: Insufficient documentation

## 2011-10-08 DIAGNOSIS — G4733 Obstructive sleep apnea (adult) (pediatric): Secondary | ICD-10-CM | POA: Insufficient documentation

## 2011-10-08 DIAGNOSIS — E7800 Pure hypercholesterolemia, unspecified: Secondary | ICD-10-CM | POA: Insufficient documentation

## 2011-10-08 DIAGNOSIS — S93609A Unspecified sprain of unspecified foot, initial encounter: Secondary | ICD-10-CM

## 2011-10-08 DIAGNOSIS — E119 Type 2 diabetes mellitus without complications: Secondary | ICD-10-CM | POA: Insufficient documentation

## 2011-10-08 MED ORDER — HYDROCODONE-ACETAMINOPHEN 5-325 MG PO TABS: 1.0000 | ORAL_TABLET | ORAL | Status: AC | PRN

## 2011-10-08 NOTE — ED Provider Notes (Signed)
Medical screening examination/treatment/procedure(s) were performed by non-physician practitioner and as supervising physician I was immediately available for consultation/collaboration.   Glynn Octave, MD 10/08/11 [phone]

## 2011-10-08 NOTE — Discharge Instructions (Signed)
Your xray is negative for fracture or dislocation. Please apply ice and elevate the left foot today and tomorrow. Please use the splint and wooden shoe until Tuesday. 7/ 2. Tylenol for mild pain. Norco for more severe pain. Please see Dr Romeo Apple or orthopedic MD of your choice if not improving.Foot Sprain You have a sprained foot. When you twist your foot, the ligaments that hold the joints together are injured. This may cause pain, swelling, bruising, and difficulty walking. Proper treatment will shorten your disability and help you prevent re-injury. To treat a sprained foot you should:  Elevate your foot for the next 2-3 days to reduce swelling.   Apply ice packs to the foot for 20-30 minutes every 2-3 hours.   Wrap your foot with a compression bandage as long as it is swollen or tender.   Do not walk on your foot if it still hurts a lot.  Use crutches or a cane until weight bearing becomes painless.   Special podiatric shoes or shoes with rigid soles may be useful in allowing earlier walking.  Only take over-the-counter or prescription medicines for pain, discomfort, or fever as directed by your caregiver. Most foot sprains will heal completely in 3-6 weeks with proper rest.  If you still have pain or swelling after 2-3 weeks, or if your pain worsens, you should see your doctor for further evaluation. Document Released: 05/06/2004 Document Revised: 03/18/2011 Document Reviewed: 03/30/2008 [facility] Patient Information 2012 [place], [place].

## 2011-10-08 NOTE — ED Provider Notes (Signed)
History     CSN: [id number]  Arrival date & time 10/08/11  1478   First MD Initiated Contact with Patient 10/08/11 [phone]      Chief Complaint  Patient presents with  . Foot Pain    (Consider location/radiation/quality/duration/timing/severity/associated sxs/prior treatment) Patient is a 61 y.o. female presenting with lower extremity pain. The history is provided by the patient.  Foot Pain This is a new problem. The current episode started yesterday. The problem occurs constantly. The problem has been gradually worsening. Associated symptoms include arthralgias and chest pain. Pertinent negatives include no abdominal pain, coughing, nausea or neck pain. The symptoms are aggravated by standing and walking. She has tried NSAIDs for the symptoms. The treatment provided no relief.    Past Medical History  Diagnosis Date  . Coronary artery disease   . Hypertension   . High cholesterol   . Diabetes mellitus   . Cancer 7/05    breast; mastectomy, chemo and radiation  . 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 OSA on CPAP 04/02/2011    Past Surgical History  Procedure Date  . Breast surgery   . Cardiac surgery   . Tubal ligation   . Appendectomy   . Cholecystectomy   . Thyroid surgery   . Laparotomy 04/07/2011    Procedure: EXPLORATORY LAPAROTOMY;  Surgeon: Fabio Bering, MD;  Location: [facility];  Service: General;  Laterality: N/A;    Family History  Problem Relation Age of Onset  . Crohn's disease Sister     History  Substance Use Topics  . Smoking status: Current Everyday Smoker -- 1.0 packs/day    Types: Cigarettes  . Smokeless tobacco: Not on file  . Alcohol Use: No    OB History    Grav Para Term Preterm Abortions TAB SAB Ect Mult Living                  Review of Systems  Constitutional: Negative for activity change.       All ROS Neg except as noted in HPI  HENT: Negative for nosebleeds and neck pain.   Eyes: Negative for photophobia and discharge.    Respiratory: Positive for wheezing. Negative for cough and shortness of breath.   Cardiovascular: Positive for chest pain. Negative for palpitations.  Gastrointestinal: Negative for nausea, abdominal pain and blood in stool.  Genitourinary: Negative for dysuria, frequency and hematuria.  Musculoskeletal: Positive for arthralgias. Negative for back pain.  Skin: Negative.   Neurological: Negative for dizziness, seizures and speech difficulty.  Psychiatric/Behavioral: Negative for hallucinations and confusion.    Allergies  Review of patient's allergies indicates no known allergies.  Home Medications   Current Outpatient Rx  Name Route Sig Dispense Refill  . ASPIRIN EC 81 MG PO TBEC Oral Take 81 mg by mouth every morning.      Marland Kitchen CLOPIDOGREL BISULFATE 75 MG PO TABS Oral Take 75 mg by mouth every morning.      . COLESEVELAM HCL 625 MG PO TABS Oral Take 1,875 mg by mouth 2 (two) times daily.      . OMEGA-3 FATTY ACIDS 1000 MG PO CAPS Oral Take 1 g by mouth 2 (two) times daily.    Marland Kitchen FLAXSEED OIL PO Oral Take 1 capsule by mouth 2 (two) times daily.    Marland Kitchen GLIPIZIDE 10 MG PO TABS Oral Take 10 mg by mouth 2 (two) times daily with a meal.      . ISOSORBIDE MONONITRATE ER 60 MG PO  TB24 Oral Take 60 mg by mouth every morning.      Marland Kitchen LEVOTHYROXINE SODIUM 150 MCG PO TABS Oral Take 150 mcg by mouth daily.    Marland Kitchen LOSARTAN POTASSIUM 50 MG PO TABS Oral Take 50 mg by mouth every morning.      Marland Kitchen NAPROXEN SODIUM 220 MG PO TABS Oral Take 220 mg by mouth 2 (two) times daily.      Marland Kitchen OMEPRAZOLE 20 MG PO CPDR Oral Take 20 mg by mouth every morning.      Marland Kitchen OVER THE COUNTER MEDICATION Oral Take 1 tablet by mouth at bedtime. OTC niacin    . PREGABALIN 75 MG PO CAPS Oral Take 75 mg by mouth 2 (two) times daily.      Marland Kitchen ROSUVASTATIN CALCIUM 20 MG PO TABS Oral Take 20 mg by mouth at bedtime.      Marland Kitchen HYDROCODONE-ACETAMINOPHEN 5-325 MG PO TABS Oral Take 1 tablet by mouth every 4 (four) hours as needed for pain. 15 tablet 0     BP 117/72  Pulse 69  Temp 98.8 F (37.1 C) (Oral)  Resp 18  Ht 5\' 4"  (1.626 m)  Wt 190 lb (86.183 kg)  BMI 32.61 kg/m2  SpO2 96%  Physical Exam  Nursing note and vitals reviewed. Constitutional: She is oriented to person, place, and time. She appears well-developed and well-nourished.  Non-toxic appearance.  HENT:  Head: Normocephalic.  Right Ear: Tympanic membrane and external ear normal.  Left Ear: Tympanic membrane and external ear normal.  Eyes: EOM and lids are normal. Pupils are equal, round, and reactive to light.  Neck: Normal range of motion. Neck supple. Carotid bruit is not present.  Cardiovascular: Normal rate, regular rhythm, normal heart sounds, intact distal pulses and normal pulses.   Pulmonary/Chest: No respiratory distress. She has wheezes. She has rhonchi.  Abdominal: Soft. Bowel sounds are normal. There is no tenderness. There is no guarding.  Musculoskeletal: Normal range of motion.       There is bruising at the base of the 4th and 5th toes, with extention to lateral mid foot. Pain to palpation. Pain with flex and extention of the left ankle. Achilles intact. FROM of the left knee and hip.  Lymphadenopathy:       Head (right side): No submandibular adenopathy present.       Head (left side): No submandibular adenopathy present.    She has no cervical adenopathy.  Neurological: She is alert and oriented to person, place, and time. She has normal strength. No cranial nerve deficit or sensory deficit.  Skin: Skin is warm and dry.  Psychiatric: She has a normal mood and affect. Her speech is normal.    ED Course  Procedures (including critical care time)  Labs Reviewed - No data to display Dg Foot Complete Right  10/08/2011  *RADIOLOGY REPORT*  Clinical Data: Foot pain.  RIGHT FOOT COMPLETE - 3+ VIEW  Comparison: None.  Findings: No acute bony abnormality.  Specifically, no fracture, subluxation, or dislocation.  Soft tissues are intact.  IMPRESSION: No  acute bony abnormality.  Original Report Authenticated By: Cyndie Chime, M.D.   Pulse Ox  96% on room air. WNL by my interpretation.  1. Foot sprain       MDM  I have reviewed nursing notes, vital signs, and all appropriate lab and imaging results for this patient. Foot xray is negative for fracture or dislocation. Pt fitted with Watson-Jones splint and Post op shoe. Pt will apply  ice and elevation over the next 48 hour. Rx for norco given. Pt to see Dr Romeo Apple for evaluation if not improving. Safe for pt to be d/c home.       Kathie Dike, [place] 10/08/11 1027

## 2011-10-08 NOTE — ED Notes (Signed)
Pt stepped in hole last night. Complaining of foot pain with exertion. Bruising noted.

## 2011-10-21 ENCOUNTER — Ambulatory Visit: Payer: Self-pay | Admitting: Oncology

## 2012-11-01 ENCOUNTER — Other Ambulatory Visit (HOSPITAL_COMMUNITY): Payer: Self-pay | Admitting: Cardiovascular Disease

## 2012-11-01 DIAGNOSIS — I739 Peripheral vascular disease, unspecified: Secondary | ICD-10-CM

## 2012-11-09 ENCOUNTER — Ambulatory Visit: Payer: Self-pay | Admitting: Family Medicine

## 2012-11-16 ENCOUNTER — Ambulatory Visit (HOSPITAL_COMMUNITY)
Admission: RE | Admit: 2012-11-16 | Discharge: 2012-11-16 | Disposition: A | Discharge status: Home / Self Care | Payer: 59 | Source: Ambulatory Visit | Attending: Cardiovascular Disease | Admitting: Cardiovascular Disease

## 2012-11-16 DIAGNOSIS — I739 Peripheral vascular disease, unspecified: Secondary | ICD-10-CM | POA: Insufficient documentation

## 2012-11-16 NOTE — Progress Notes (Signed)
Carotid Duplex Completed. °Michaela Wilcox ° °

## 2012-11-30 ENCOUNTER — Telehealth: Payer: Self-pay | Admitting: *Deleted

## 2012-11-30 ENCOUNTER — Encounter: Payer: Self-pay | Admitting: *Deleted

## 2012-11-30 DIAGNOSIS — I6529 Occlusion and stenosis of unspecified carotid artery: Secondary | ICD-10-CM

## 2012-11-30 NOTE — Telephone Encounter (Signed)
Message copied by Marella Bile on Thu Nov 30, 2012  3:01 PM ------      Message from: Runell Gess      Created: Mon Nov 27, 2012  9:19 PM       No change from prior study. Repeat in 12 months. ------

## 2012-11-30 NOTE — Telephone Encounter (Signed)
Order placed for doppler in 1 year

## 2012-12-07 ENCOUNTER — Encounter: Payer: Self-pay | Admitting: *Deleted

## 2012-12-14 ENCOUNTER — Encounter: Payer: Self-pay | Admitting: Cardiovascular Disease

## 2012-12-14 ENCOUNTER — Ambulatory Visit (INDEPENDENT_AMBULATORY_CARE_PROVIDER_SITE_OTHER): Payer: 59 | Admitting: Cardiovascular Disease

## 2012-12-14 VITALS — BP 118/72 | HR 54 | Ht 64.0 in | Wt 191.0 lb

## 2012-12-14 DIAGNOSIS — I2510 Atherosclerotic heart disease of native coronary artery without angina pectoris: Secondary | ICD-10-CM

## 2012-12-14 DIAGNOSIS — I779 Disorder of arteries and arterioles, unspecified: Secondary | ICD-10-CM | POA: Insufficient documentation

## 2012-12-14 DIAGNOSIS — E785 Hyperlipidemia, unspecified: Secondary | ICD-10-CM

## 2012-12-14 DIAGNOSIS — I10 Essential (primary) hypertension: Secondary | ICD-10-CM

## 2012-12-14 NOTE — Progress Notes (Signed)
12/14/2012 Michaela Wilcox   1950/05/14  [id number]  Primary Physician Evelene Croon, MD Primary Cardiologist: Runell Gess MD [name]   HPI:  The patient is a 62 year old, moderately overweight, divorced Caucasian female, mother of 1 child, whom I last saw 6 months ago. She has a history of CAD and PVOD. She has a known occluded left common carotid with mild right ICA stenosis. She is neurologically asymptomatic. We have been following her by duplex ultrasound. She has known coronary artery disease status post LAD and circumflex stenting back in 2009 with recath a year later. She has noncritical CAD. Her other problems include hypertension, hyperlipidemia and diabetes. She had a Myoview stress test performed July 02, 2010 which was nonischemic. She is otherwise asymptomatic. She was hospitalized late last year at [facility] because of small bowel obstruction, underwent lysis of adhesions by Dr. Tilford Pillar April 07, 2011. She does continue to smoke 1 pack per day. Since I saw her a year ago she apparently has been diagnosed with lupus. She denies chest pain or shortness of breath.       Current Outpatient Prescriptions  Medication Sig Dispense Refill  . aspirin EC 81 MG tablet Take 81 mg by mouth every morning.        . colesevelam (WELCHOL) 625 MG tablet Take 1,875 mg by mouth 2 (two) times daily.        . fish oil-omega-3 fatty acids 1000 MG capsule Take 1 g by mouth 2 (two) times daily.      . Flaxseed, Linseed, (FLAXSEED OIL PO) Take 1 capsule by mouth 2 (two) times daily.      Marland Kitchen glipiZIDE (GLUCOTROL) 10 MG tablet Take 10 mg by mouth 2 (two) times daily with a meal.        . isosorbide mononitrate (IMDUR) 60 MG 24 hr tablet Take 60 mg by mouth every morning.        Marland Kitchen levothyroxine (SYNTHROID, LEVOTHROID) 200 MCG tablet Take 200 mcg by mouth daily before breakfast.      . losartan (COZAAR) 50 MG tablet Take 50 mg by mouth every morning.        .  naproxen sodium (ANAPROX) 220 MG tablet Take 220 mg by mouth 2 (two) times daily.        Marland Kitchen omeprazole (PRILOSEC) 20 MG capsule Take 20 mg by mouth every morning.        Marland Kitchen OVER THE COUNTER MEDICATION Take 1 tablet by mouth at bedtime. OTC niacin      . pregabalin (LYRICA) 75 MG capsule Take 75 mg by mouth 2 (two) times daily.        . rosuvastatin (CRESTOR) 20 MG tablet Take 20 mg by mouth at bedtime.         No current facility-administered medications for this visit.    No Known Allergies  History   Social History  . Marital Status: Divorced    Spouse Name: N/A    Number of Children: N/A  . Years of Education: N/A   Occupational History  . Not on file.   Social History Main Topics  . Smoking status: Current Every Day Smoker -- 1.00 packs/day    Types: Cigarettes  . Smokeless tobacco: Not on file  . Alcohol Use: No  . Drug Use: No  . Sexual Activity: Not on file   Other Topics Concern  . Not on file   Social History Narrative  . No narrative on file  Review of Systems: General: negative for chills, fever, night sweats or weight changes.  Cardiovascular: negative for chest pain, dyspnea on exertion, edema, orthopnea, palpitations, paroxysmal nocturnal dyspnea or shortness of breath Dermatological: negative for rash Respiratory: negative for cough or wheezing Urologic: negative for hematuria Abdominal: negative for nausea, vomiting, diarrhea, bright red blood per rectum, melena, or hematemesis Neurologic: negative for visual changes, syncope, or dizziness All other systems reviewed and are otherwise negative except as noted above.    Blood pressure 118/72, pulse 54, height 5\' 4"  (1.626 m), weight 191 lb (86.637 kg).  General appearance: alert and no distress Neck: no adenopathy, no carotid bruit, no JVD, supple, symmetrical, trachea midline and thyroid not enlarged, symmetric, no tenderness/mass/nodules Lungs: clear to auscultation bilaterally Heart: regular rate  and rhythm, S1, S2 normal, no murmur, click, rub or gallop Extremities: extremities normal, atraumatic, no cyanosis or edema  Sinus bradycardia at 55 without ST or T wave changes  ASSESSMENT AND PLAN:   Carotid artery disease The patient has known left common carotid artery stent occlusion with mild to moderate disease in her right internal carotid artery has not changed over time. She is neurologically asymptomatic.  CAD (coronary artery disease) She has a history of LAD and circumflex stenting in 2009 with noncritical CAD otherwise by subsequent cath. She had a negative Myoview 06/30/10. She denies chest pain or shortness of breath  Hyperlipidemia On statin therapy followed by her PCP      Runell Gess MD [facility], [facility] 12/14/2012 8:41 AM

## 2012-12-14 NOTE — Assessment & Plan Note (Signed)
On statin therapy followed by her PCP 

## 2012-12-14 NOTE — Assessment & Plan Note (Signed)
She has a history of LAD and circumflex stenting in 2009 with noncritical CAD otherwise by subsequent cath. She had a negative Myoview 06/30/10. She denies chest pain or shortness of breath

## 2012-12-14 NOTE — Assessment & Plan Note (Signed)
The patient has known left common carotid artery stent occlusion with mild to moderate disease in her right internal carotid artery has not changed over time. She is neurologically asymptomatic.

## 2012-12-14 NOTE — Patient Instructions (Addendum)
Your physician wants you to follow-up in: 1 year with Dr Berry. You will receive a reminder letter in the mail two months in advance. If you don't receive a letter, please call our office to schedule the follow-up appointment.  

## 2013-01-24 ENCOUNTER — Encounter: Payer: Self-pay | Admitting: Cardiovascular Disease

## 2013-07-11 ENCOUNTER — Ambulatory Visit: Payer: Self-pay | Admitting: Family Medicine

## 2013-07-11 ENCOUNTER — Telehealth: Payer: Self-pay | Admitting: Cardiovascular Disease

## 2013-07-11 NOTE — Telephone Encounter (Signed)
Spoke w/ Estill Bamberg.  Stated pt scheduled for breast biopsy and she wanted to make sure pt isn't on any blood thinners.  Reviewed med list and informed pt hasn't been seen since September 2014, but was on ASA and naproxen.  Estill Bamberg stated pt said she isn't taking ASA anymore, but is taking isosorbide.  Estill Bamberg informed isosorbide is not a blood thinner and is a long-acting NTG.  Verbalized understanding.

## 2013-07-23 ENCOUNTER — Ambulatory Visit: Payer: Self-pay | Admitting: Family Medicine

## 2013-07-24 LAB — PATHOLOGY REPORT

## 2013-11-09 ENCOUNTER — Telehealth (HOSPITAL_COMMUNITY): Payer: Self-pay | Admitting: *Deleted

## 2013-11-29 ENCOUNTER — Telehealth (HOSPITAL_COMMUNITY): Payer: Self-pay | Admitting: *Deleted

## 2014-06-11 ENCOUNTER — Ambulatory Visit: Payer: Self-pay | Admitting: Cardiovascular Disease

## 2014-06-18 IMAGING — CR DG FOOT COMPLETE 3+V*R*
3 series · 3 of 3 positions shown · non-contrast
Comparison: None.

CLINICAL DATA: Foot pain.

RIGHT FOOT COMPLETE - 3+ VIEW

[view not recorded (1 of 3)]
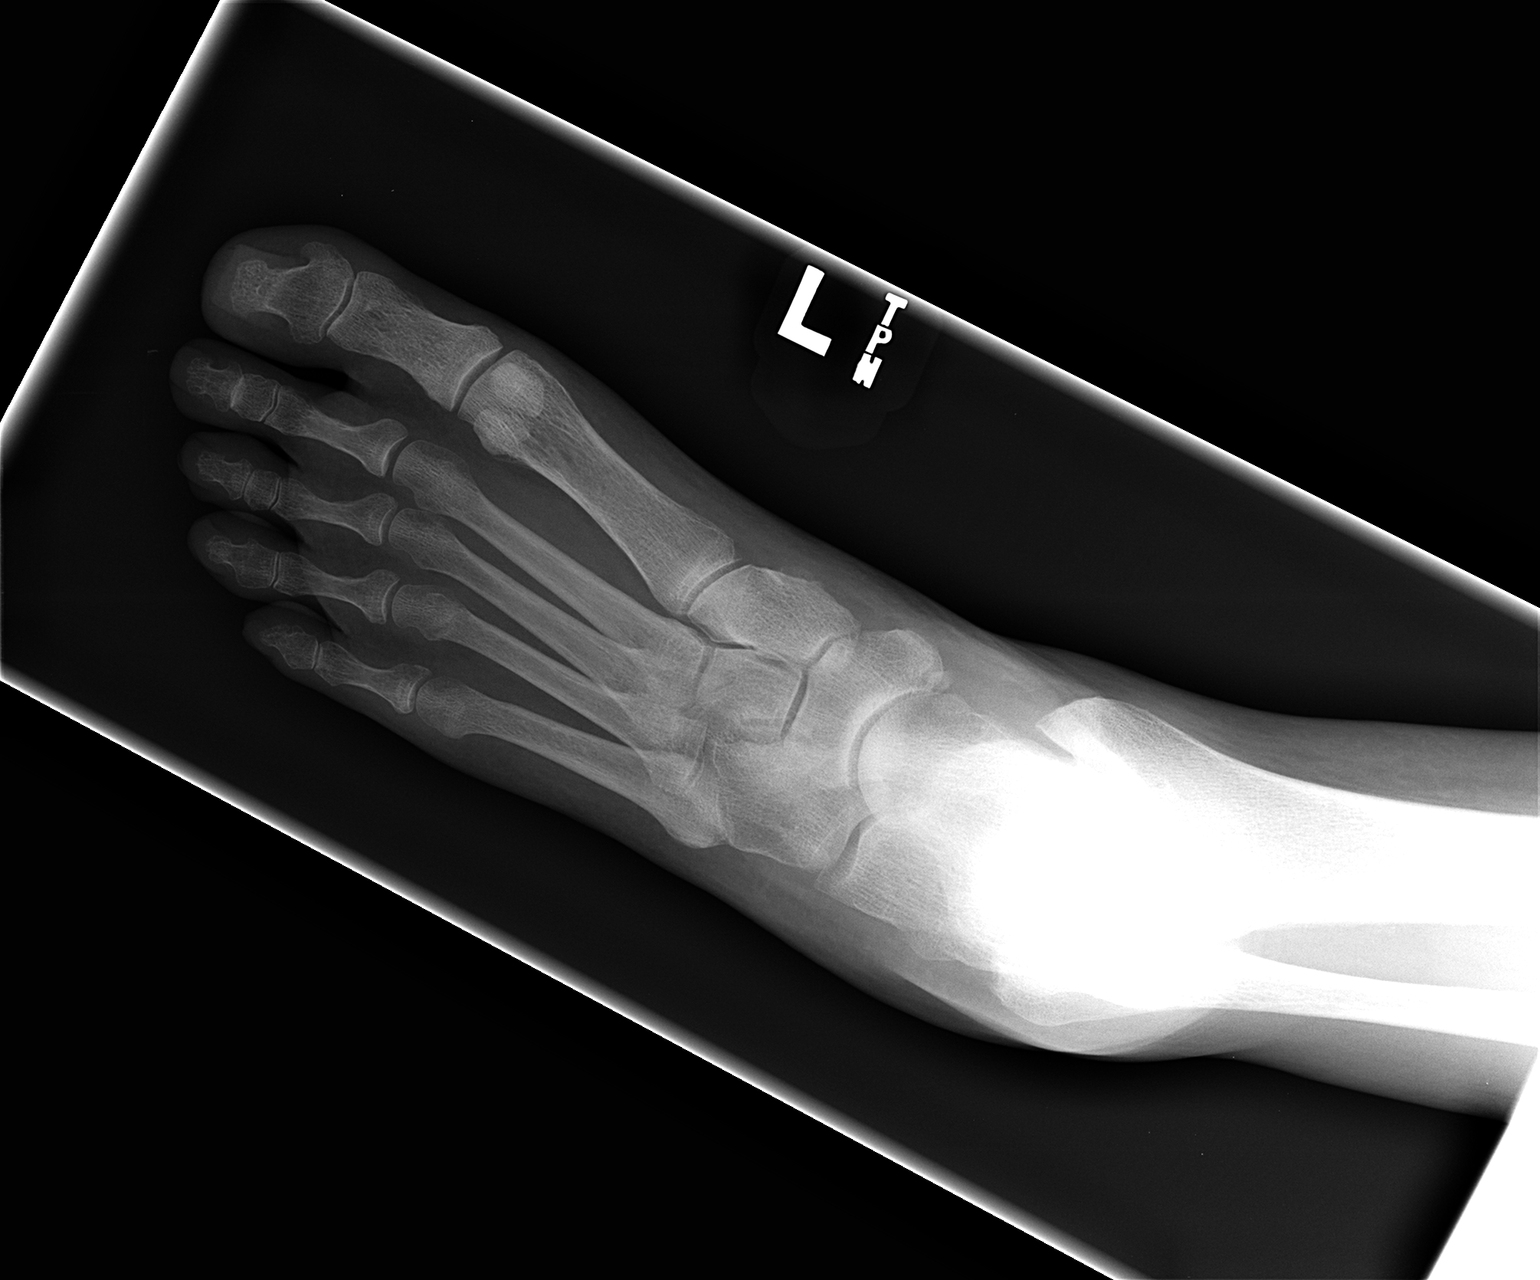

[view not recorded (2 of 3)]
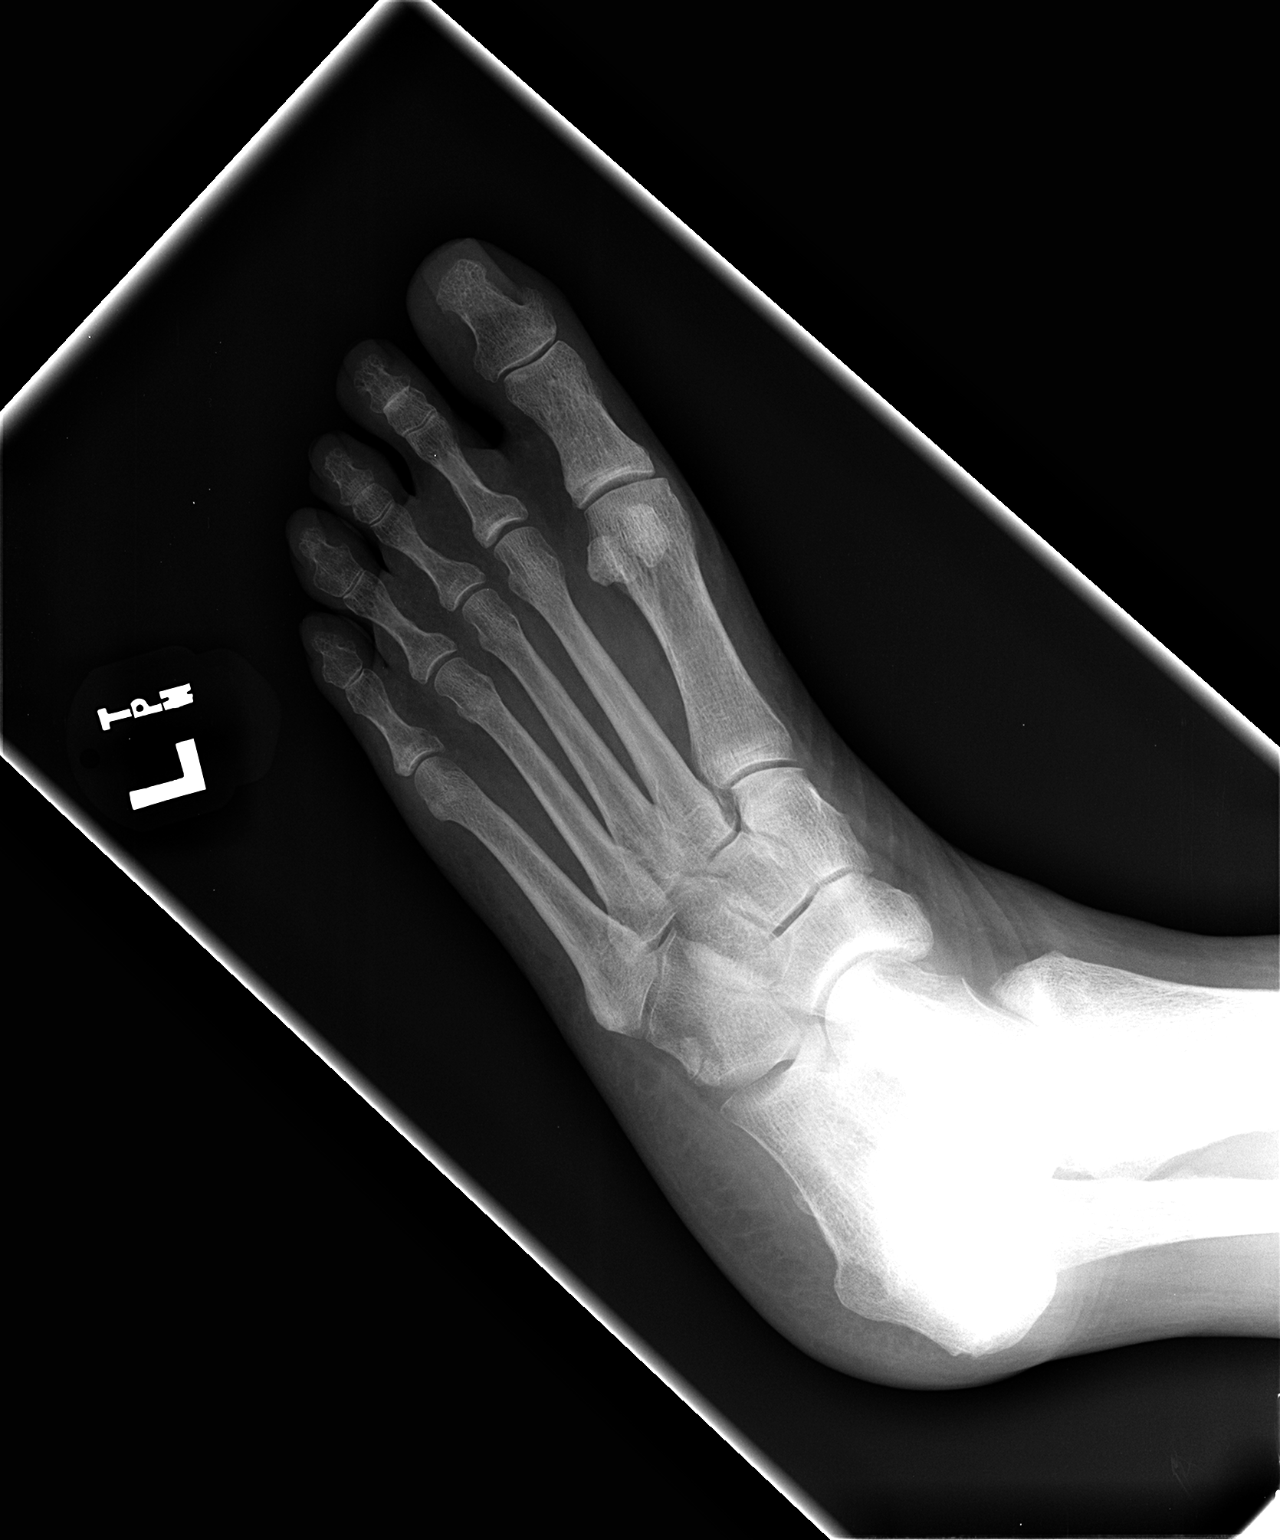

[view not recorded (3 of 3)]
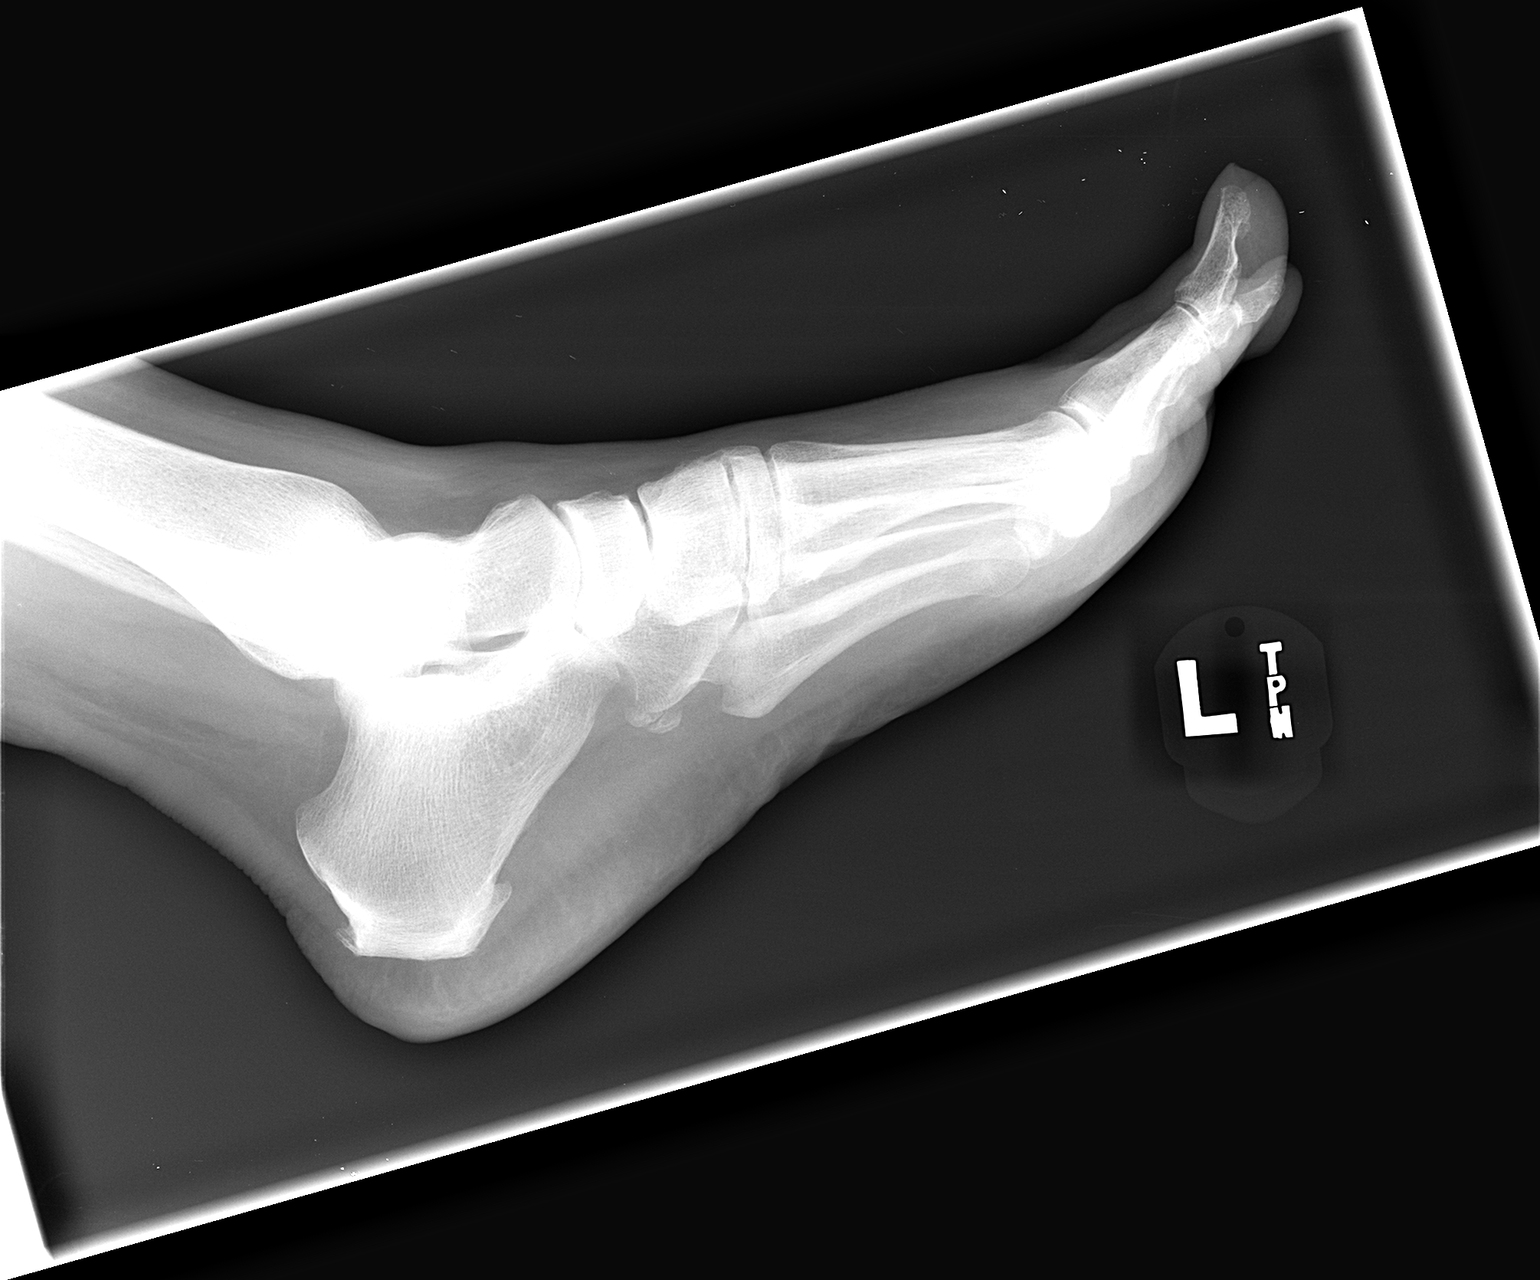

[3 of 3 positions shown; findings below may reference images not displayed]

FINDINGS: No acute bony abnormality.  Specifically, no fracture,
subluxation, or dislocation.  Soft tissues are intact.
IMPRESSION: No acute bony abnormality.

## 2015-09-02 ENCOUNTER — Other Ambulatory Visit: Payer: Self-pay | Admitting: Family Medicine

## 2015-09-02 DIAGNOSIS — Z1231 Encounter for screening mammogram for malignant neoplasm of breast: Secondary | ICD-10-CM

## 2015-09-17 ENCOUNTER — Ambulatory Visit: Payer: Self-pay | Attending: Family Medicine

## 2015-11-13 ENCOUNTER — Other Ambulatory Visit: Payer: Self-pay | Admitting: Family Medicine

## 2015-11-13 ENCOUNTER — Ambulatory Visit
Admission: RE | Admit: 2015-11-13 | Discharge: 2015-11-13 | Disposition: A | Discharge status: Home / Self Care | Payer: Medicare Other | Source: Ambulatory Visit | Attending: Family Medicine | Admitting: Family Medicine

## 2015-11-13 DIAGNOSIS — Z1231 Encounter for screening mammogram for malignant neoplasm of breast: Secondary | ICD-10-CM

## 2015-11-13 HISTORY — DX: Malignant neoplasm of unspecified site of unspecified female breast: C50.919

## 2016-11-22 ENCOUNTER — Ambulatory Visit
Admission: RE | Admit: 2016-11-22 | Discharge: 2016-11-22 | Disposition: A | Discharge status: Home / Self Care | Payer: Medicare Other | Source: Ambulatory Visit | Attending: Obstetrics and Gynecology | Admitting: Obstetrics and Gynecology

## 2016-11-22 ENCOUNTER — Other Ambulatory Visit: Payer: Self-pay | Admitting: Obstetrics and Gynecology

## 2016-11-22 DIAGNOSIS — Z1231 Encounter for screening mammogram for malignant neoplasm of breast: Secondary | ICD-10-CM | POA: Diagnosis not present

## 2017-09-26 ENCOUNTER — Ambulatory Visit (INDEPENDENT_AMBULATORY_CARE_PROVIDER_SITE_OTHER): Payer: Medicare HMO | Admitting: Podiatry

## 2017-09-26 ENCOUNTER — Encounter: Payer: Self-pay | Admitting: Podiatry

## 2017-09-26 VITALS — BP 132/69 | HR 78

## 2017-09-26 DIAGNOSIS — G620 Drug-induced polyneuropathy: Secondary | ICD-10-CM

## 2017-09-26 DIAGNOSIS — M79675 Pain in left toe(s): Secondary | ICD-10-CM

## 2017-09-26 DIAGNOSIS — E119 Type 2 diabetes mellitus without complications: Secondary | ICD-10-CM

## 2017-09-26 DIAGNOSIS — M79674 Pain in right toe(s): Secondary | ICD-10-CM

## 2017-09-26 DIAGNOSIS — B351 Tinea unguium: Secondary | ICD-10-CM

## 2017-09-26 NOTE — Progress Notes (Signed)
This patient presents to the office with chief complaint of long thick nails and diabetic feet.  This patient  says there  is  no pain and discomfort in their feet.  This patient says there are long thick painful nails.  These nails are painful walking and wearing shoes.  Patient has no history of infection or drainage from both feet.  Patient is unable to  [place] treat his own nails . 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 This patient presents  to the office today for treatment of the  long nails and a foot evaluation due to history of  diabetes.  General Appearance  Alert, conversant and in no acute stress.  Vascular  Dorsalis pedis and posterior tibial  pulses are palpable  bilaterally.  Capillary return is within normal limits  bilaterally. Temperature is within normal limits  bilaterally.  Neurologic  Senn-Weinstein monofilament wire test within normal limits  bilaterally. Muscle power within normal limits bilaterally.  Nails Thick disfigured discolored nails with subungual debris  from hallux  bilaterally. No evidence of bacterial infection or drainage bilaterally.  Orthopedic  No limitations of motion of motion feet .  No crepitus or effusions noted.  No bony pathology or digital deformities noted.  Skin  normotropic skin with no porokeratosis noted bilaterally.  No signs of infections or ulcers noted.     Onychomycosis  Diabetes with no foot complications  IE  Debride nails x 10.  A diabetic foot exam was performed and there is no evidence of any vascular or neurologic pathology.   RTC 3 months.   Gardiner Barefoot DPM

## 2017-10-14 ENCOUNTER — Encounter (INDEPENDENT_AMBULATORY_CARE_PROVIDER_SITE_OTHER): Payer: Self-pay | Admitting: Vascular Surgery

## 2017-10-14 ENCOUNTER — Ambulatory Visit (INDEPENDENT_AMBULATORY_CARE_PROVIDER_SITE_OTHER): Payer: Medicare HMO | Admitting: Vascular Surgery

## 2017-10-14 VITALS — BP 137/72 | HR 68 | Resp 16 | Ht 64.0 in | Wt 198.0 lb

## 2017-10-14 DIAGNOSIS — I10 Essential (primary) hypertension: Secondary | ICD-10-CM | POA: Diagnosis not present

## 2017-10-14 DIAGNOSIS — E785 Hyperlipidemia, unspecified: Secondary | ICD-10-CM | POA: Diagnosis not present

## 2017-10-14 DIAGNOSIS — E118 Type 2 diabetes mellitus with unspecified complications: Secondary | ICD-10-CM | POA: Diagnosis not present

## 2017-10-14 DIAGNOSIS — I779 Disorder of arteries and arterioles, unspecified: Secondary | ICD-10-CM | POA: Diagnosis not present

## 2017-10-14 DIAGNOSIS — I739 Peripheral vascular disease, unspecified: Principal | ICD-10-CM

## 2017-10-14 DIAGNOSIS — F17200 Nicotine dependence, unspecified, uncomplicated: Secondary | ICD-10-CM | POA: Insufficient documentation

## 2017-10-14 NOTE — Assessment & Plan Note (Signed)
The patient has a chronic left carotid artery occlusion and stable, mild less than 50% right ICA stenosis.  This is stable from several years ago.  At this time, she would not benefit from surgery or intervention. Unclear if symptoms are related to her carotid disease or not, but no clear focal neurologic symptoms.  Continue ASA/Plavix/Lipitor. Recheck in one year.

## 2017-10-14 NOTE — Assessment & Plan Note (Signed)
blood pressure control important in reducing the progression of atherosclerotic disease. On appropriate oral medications.  

## 2017-10-14 NOTE — Assessment & Plan Note (Signed)
We had a discussion for approximately 4 minutes regarding the absolute need for smoking cessation due to the deleterious nature of tobacco on the vascular system. We discussed the tobacco use would diminish patency of any intervention, and likely significantly worsen progressio of disease. We discussed multiple agents for quitting including replacement therapy or medications to reduce cravings such as Chantix. The patient voices their understanding of the importance of smoking cessation.  

## 2017-10-14 NOTE — Assessment & Plan Note (Signed)
blood glucose control important in reducing the progression of atherosclerotic disease. Also, involved in wound healing. On appropriate medications.  

## 2017-10-14 NOTE — Patient Instructions (Signed)
Carotid Artery Disease The carotid arteries are arteries on both sides of the neck. They carry blood to the brain. Carotid artery disease is when the arteries get smaller (narrow) or get blocked. If these arteries get smaller or get blocked, you are more likely to have a stroke or warning stroke (transient ischemic attack). Follow these instructions at home:  Take medicines as told by your doctor. Make sure you understand all your medicine instructions. Do not stop your medicines without talking to your doctor first.  Follow your doctor's diet instructions. It is important to eat a healthy diet that includes plenty of: ? Fresh fruits. ? Vegetables. ? Lean meats.  Avoid: ? High-fat foods. ? High-sodium foods. ? Foods that are fried, overly processed, or have poor nutritional value.  Stay a healthy weight.  Stay active. Get at least 30 minutes of activity every day.  Do not smoke.  Limit alcohol use to: ? No more than 2 drinks a day for men. ? No more than 1 drink a day for women who are not pregnant.  Do not use illegal drugs.  Keep all doctor visits as told. Get help right away if:  You have sudden weakness or loss of feeling (numbness) on one side of the body, such as the face, arm, or leg.  You have sudden confusion.  You have trouble speaking (aphasia) or understanding.  You have sudden trouble seeing out of one or both eyes.  You have sudden trouble walking.  You have dizziness or feel like you might pass out (faint).  You have a loss of balance or your movements are not steady (uncoordinated).  You have a sudden, severe headache with no known cause.  You have trouble swallowing (dysphagia). Call your local emergency services (911 in [place] Do notdrive yourself to the clinic or hospital. This information is not intended to replace advice given to you by your health care provider. Make sure you discuss any questions you have with your health care  provider. Document Released: 03/15/2012 Document Revised: 09/04/2015 Document Reviewed: 09/27/2012 Elsevier Interactive Patient Education  2018 [employer]  

## 2017-10-14 NOTE — Progress Notes (Signed)
Patient ID: Michaela Wilcox, female   DOB: 12-18-1950, 67 y.o.   MRN: [id number]  Chief Complaint  Patient presents with  . New Patient (Initial Visit)    Carotid Stenosis-consult only    HPI Michaela Wilcox is a 67 y.o. female.  I am asked to see the patient by Dr. Jimmye Norman for evaluation of carotid stenosis.  The patient reports left-sided headaches when she lays on her right side over the past several months peer this is a somewhat odd symptom that she had a known history of carotid artery disease so that was recently rechecked by her primary care physician. She does get some dizziness and light headedness.  No arm or leg weakness or numbness.  No fever or chills.  No speech or swallowing difficulty.  The patient has a chronic left carotid artery occlusion and stable, mild less than 50% right ICA stenosis.  This is stable from several years ago.   Past Medical History:  Diagnosis Date  . Breast cancer ([place]) 2005   left  . Cancer ([facility]) 7/05   breast; mastectomy, chemo and radiation  . Carotid artery disease ([place])   . Coronary artery disease   . Diabetes mellitus   . High cholesterol    on statin  . Hypertension   . 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 OSA on CPAP 04/02/2011  . 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 APPENDECTOMY    . BREAST SURGERY    . CARDIAC CATHETERIZATION  2010   recath revealing non-critica CAD  . CARDIAC SURGERY    . carotid doppler  10/20/09   right bulb and ICA 50-69%,left CCA occluded  . CHOLECYSTECTOMY    . CORONARY ANGIOPLASTY  2009   s/p LAD AND CIRC STENT  . LAPAROTOMY  04/07/2011   Procedure: EXPLORATORY LAPAROTOMY;  Surgeon: Donato Heinz, MD;  Location: [facility];  Service: General;  Laterality: N/A;  . MASTECTOMY Left 2005  . NM MYOCAR PERF WALL MOTION  July 02, 2010   entirely normal  . THYROID SURGERY      . TUBAL LIGATION      Family History  Problem Relation Age of Onset  . Crohn's disease Sister   . Breast cancer Neg Hx   no bleeding disorders, clotting disorders, or aneurysms  Social History Social History   Tobacco Use  . Smoking status: Current Every Day Smoker    Packs/day: 1.00    Types: Cigarettes  . Smokeless tobacco: Never Used  Substance Use Topics  . Alcohol use: No  . Drug use: No    No Known Allergies  Current Outpatient Medications  Medication Sig Dispense Refill  . albuterol (VENTOLIN HFA) 108 (90 Base) MCG/ACT inhaler Ventolin HFA 90 mcg/actuation aerosol inhaler    . aspirin EC 81 MG tablet Take 81 mg by mouth every morning.      Marland Kitchen atorvastatin (LIPITOR) 80 MG tablet atorvastatin 80 mg tablet    . buPROPion (WELLBUTRIN XL) 150 MG 24 hr tablet Take 150 mg by mouth daily.    . clopidogrel (PLAVIX) 75 MG tablet clopidogrel 75 mg tablet    . colesevelam (WELCHOL) 625 MG tablet Take 1,875 mg by mouth 2 (two) times daily.      . fish oil-omega-3 fatty acids 1000 MG capsule Take 1 g by mouth 2 (two)  times daily.    . Flaxseed, Linseed, (FLAXSEED OIL PO) Take 1 capsule by mouth 2 (two) times daily.    Marland Kitchen glipiZIDE (GLUCOTROL) 10 MG tablet glipizide 10 mg tablet    . isosorbide mononitrate (IMDUR) 60 MG 24 hr tablet Take 60 mg by mouth daily.    Marland Kitchen levothyroxine (SYNTHROID, LEVOTHROID) 150 MCG tablet levothyroxine 150 mcg tablet    . linagliptin (TRADJENTA) 5 MG TABS tablet Tradjenta 5 mg tablet    . losartan (COZAAR) 50 MG tablet Take 50 mg by mouth every morning.      . naproxen sodium (ANAPROX) 220 MG tablet Take 220 mg by mouth 2 (two) times daily.      Marland Kitchen OVER THE COUNTER MEDICATION Take 1 tablet by mouth at bedtime. OTC niacin    . pioglitazone (ACTOS) 45 MG tablet pioglitazone 45 mg tablet    . pramipexole (MIRAPEX) 0.25 MG tablet pramipexole 0.25 mg tablet    . pregabalin (LYRICA) 75 MG capsule Take 75 mg by mouth 2 (two) times daily.      . rosuvastatin  (CRESTOR) 20 MG tablet Take 20 mg by mouth at bedtime.      Marland Kitchen UNABLE TO FIND Incruse Ellipta 62.5 mcg/actuation powder for inhalation     No current facility-administered medications for this visit.       REVIEW OF SYSTEMS (Negative unless checked)  Constitutional: [] Weight loss  [] Fever  [] Chills Cardiac: [] Chest pain   [] Chest pressure   [] Palpitations   [] Shortness of breath when laying flat   [] Shortness of breath at rest   [x] Shortness of breath with exertion. Vascular:  [] Pain in legs with walking   [] Pain in legs at rest   [] Pain in legs when laying flat   [] Claudication   [] Pain in feet when walking  [] Pain in feet at rest  [] Pain in feet when laying flat   [] History of DVT   [] Phlebitis   [] Swelling in legs   [] Varicose veins   [] Non-healing ulcers Pulmonary:   [] Uses home oxygen   [] Productive cough   [] Hemoptysis   [x] Wheeze  [x] COPD   [] Asthma Neurologic:  [x] Dizziness  [] Blackouts   [] Seizures   [] History of stroke   [] History of TIA  [] Aphasia   [] Temporary blindness   [] Dysphagia   [] Weakness or numbness in arms   [] Weakness or numbness in legs Musculoskeletal:  [] Arthritis   [] Joint swelling   [] Joint pain   [] Low back pain Hematologic:  [] Easy bruising  [] Easy bleeding   [] Hypercoagulable state   [] Anemic  [] Hepatitis Gastrointestinal:  [] Blood in stool   [] Vomiting blood  [x] Gastroesophageal reflux/heartburn   [] Abdominal pain Genitourinary:  [] Chronic kidney disease   [] Difficult urination  [] Frequent urination  [] Burning with urination   [] Hematuria Skin:  [] Rashes   [] Ulcers   [] Wounds Psychological:  [] History of anxiety   []  History of major depression.    Physical Exam BP 137/72 (BP Location: Right Arm, Patient Position: Sitting)   Pulse 68   Resp 16   Ht 5\' 4"  (1.626 m)   Wt 198 lb (89.8 kg)   BMI 33.99 kg/m  Gen:  WD/WN, NAD Head: [place]/AT, No temporalis wasting.  Ear/Nose/Throat: Hearing grossly intact, nares w/o erythema or drainage, oropharynx w/o  Erythema/Exudate Eyes: Conjunctiva clear, sclera non-icteric  Neck: trachea midline.  No bruit or JVD.  Pulmonary:  Good air movement, clear to auscultation bilaterally.  Cardiac: RRR, normal S1, S2 Vascular:  Vessel Right Left  Radial Palpable Palpable  PT Palpable 1+ Palpable  DP Palpable Palpable   Gastrointestinal: soft, non-tender/non-distended.  Musculoskeletal: M/S 5/5 throughout.  Extremities without ischemic changes.  No deformity or atrophy. No edema. Neurologic: Sensation grossly intact in extremities.  Symmetrical.  Speech is fluent. Motor exam as listed above. Psychiatric: Judgment intact, Mood & affect appropriate for pt's clinical situation. Dermatologic: No rashes or ulcers noted.  No cellulitis or open wounds.  Radiology No results found.  Labs No results found for this or any previous visit (from the past 2160 hour(s)).  Assessment/Plan:  HTN (hypertension) blood pressure control important in reducing the progression of atherosclerotic disease. On appropriate oral medications.   Hyperlipidemia lipid control important in reducing the progression of atherosclerotic disease. Continue statin therapy   DM type 2 (diabetes mellitus, type 2) blood glucose control important in reducing the progression of atherosclerotic disease. Also, involved in wound healing. On appropriate medications.   Tobacco use disorder We had a discussion for approximately 4 minutes regarding the absolute need for smoking cessation due to the deleterious nature of tobacco on the vascular system. We discussed the tobacco use would diminish patency of any intervention, and likely significantly worsen progressio of disease. We discussed multiple agents for quitting including replacement therapy or medications to reduce cravings such as Chantix. The patient voices their understanding of the importance of smoking cessation.   Carotid artery disease The patient has  a chronic left carotid artery occlusion and stable, mild less than 50% right ICA stenosis.  This is stable from several years ago.  At this time, she would not benefit from surgery or intervention. Unclear if symptoms are related to her carotid disease or not, but no clear focal neurologic symptoms.  Continue ASA/Plavix/Lipitor. Recheck in one year.        Leotis Pain 10/14/2017, 9:40 AM   This note was created with Dragon medical transcription system.  Any errors from dictation are unintentional.

## 2017-10-14 NOTE — Assessment & Plan Note (Signed)
lipid control important in reducing the progression of atherosclerotic disease. Continue statin therapy  

## 2017-10-19 ENCOUNTER — Encounter (INDEPENDENT_AMBULATORY_CARE_PROVIDER_SITE_OTHER): Payer: Self-pay

## 2017-12-29 ENCOUNTER — Ambulatory Visit: Payer: Medicare HMO | Admitting: Podiatry

## 2018-07-19 DIAGNOSIS — Z955 Presence of coronary angioplasty implant and graft: Secondary | ICD-10-CM | POA: Insufficient documentation

## 2018-07-19 DIAGNOSIS — R0602 Shortness of breath: Secondary | ICD-10-CM | POA: Insufficient documentation

## 2018-08-10 DIAGNOSIS — R609 Edema, unspecified: Secondary | ICD-10-CM | POA: Insufficient documentation

## 2018-08-10 DIAGNOSIS — R600 Localized edema: Secondary | ICD-10-CM | POA: Insufficient documentation

## 2018-08-21 ENCOUNTER — Telehealth: Payer: Self-pay | Admitting: *Deleted

## 2018-08-21 NOTE — Telephone Encounter (Signed)
Received referral for low dose lung cancer screening CT scan. Message left at phone number listed in EMR for patient to call me back to facilitate scheduling scan.  

## 2018-08-22 ENCOUNTER — Telehealth: Payer: Self-pay | Admitting: *Deleted

## 2018-08-22 NOTE — Telephone Encounter (Signed)
Received referral for lung screening, patient contacted and reviewed process for screening and plans to schedule in near future as covid 19 restrictions have caused a delay.

## 2018-09-06 ENCOUNTER — Telehealth: Payer: Self-pay | Admitting: *Deleted

## 2018-09-06 NOTE — Telephone Encounter (Signed)
Received referral for low dose lung cancer screening CT scan. Message left at phone number listed in EMR for patient to call me back to facilitate scheduling scan.  

## 2018-09-07 ENCOUNTER — Telehealth: Payer: Self-pay | Admitting: *Deleted

## 2018-09-07 DIAGNOSIS — Z122 Encounter for screening for malignant neoplasm of respiratory organs: Secondary | ICD-10-CM

## 2018-09-07 DIAGNOSIS — Z87891 Personal history of nicotine dependence: Secondary | ICD-10-CM

## 2018-09-07 NOTE — Telephone Encounter (Signed)
Received referral for initial lung cancer screening scan. Contacted patient and obtained smoking history,(current, 33 pack year) as well as answering questions related to screening process. Patient denies signs of lung cancer such as weight loss or hemoptysis. Patient denies comorbidity that would prevent curative treatment if lung cancer were found. Patient is scheduled for shared decision making visit and CT scan on 09/12/18 at 230pm.

## 2018-09-12 ENCOUNTER — Ambulatory Visit
Admission: RE | Admit: 2018-09-12 | Discharge: 2018-09-12 | Disposition: A | Discharge status: Home / Self Care | Payer: Medicare HMO | Source: Ambulatory Visit | Attending: Oncology | Admitting: Oncology

## 2018-09-12 ENCOUNTER — Inpatient Hospital Stay: Payer: Medicare HMO | Attending: Oncology | Admitting: Oncology

## 2018-09-12 ENCOUNTER — Encounter: Payer: Self-pay | Admitting: Oncology

## 2018-09-12 ENCOUNTER — Other Ambulatory Visit: Payer: Self-pay

## 2018-09-12 DIAGNOSIS — Z87891 Personal history of nicotine dependence: Secondary | ICD-10-CM | POA: Diagnosis present

## 2018-09-12 DIAGNOSIS — Z122 Encounter for screening for malignant neoplasm of respiratory organs: Secondary | ICD-10-CM | POA: Diagnosis not present

## 2018-09-15 ENCOUNTER — Telehealth: Payer: Self-pay | Admitting: *Deleted

## 2018-09-15 NOTE — Progress Notes (Signed)
Virtual Visit via Video Note  I connected with@ on 09/15/18 at  2:30 PM EDT by a video enabled telemedicine application and verified that I am speaking with the correct person using two identifiers.  Location: Patient: Home Provider: Home   I discussed the limitations of evaluation and management by telemedicine and the availability of in person appointments. The patient expressed understanding and agreed to proceed.  I discussed the assessment and treatment plan with the patient. The patient was provided an opportunity to ask questions and all were answered. The patient agreed with the plan and demonstrated an understanding of the instructions.   The patient was advised to call back or seek an in-person evaluation if the symptoms worsen or if the condition fails to improve as anticipated.   In accordance with CMS guidelines, patient has met eligibility criteria including age, absence of signs or symptoms of lung cancer.  Social History   Tobacco Use  . Smoking status: Current Every Day Smoker    Packs/day: 1.00    Years: 33.00    Pack years: 33.00    Types: Cigarettes  . Smokeless tobacco: Never Used  Substance Use Topics  . Alcohol use: No  . Drug use: No      A shared decision-making session was conducted prior to the performance of CT scan. This includes one or more decision aids, includes benefits and harms of screening, follow-up diagnostic testing, over-diagnosis, false positive rate, and total radiation exposure.   Counseling on the importance of adherence to annual lung cancer LDCT screening, impact of co-morbidities, and ability or willingness to undergo diagnosis and treatment is imperative for compliance of the program.   Counseling on the importance of continued smoking cessation for former smokers; the importance of smoking cessation for current smokers, and information about tobacco cessation interventions have been given to patient including [place]  and [facility] quit [place] programs.   Written order for lung cancer screening with LDCT has been given to the patient and any and all questions have been answered to the best of my abilities.    Yearly follow up will be coordinated by Burgess Estelle, Thoracic Navigator.  I provided 10 minutes of face-to-face video visit time during this encounter, and > 50% was spent counseling as documented under my assessment & plan.   Jacquelin Hawking, NP

## 2018-09-15 NOTE — Telephone Encounter (Signed)
Notified patient of LDCT lung cancer screening program results with recommendation for 6 month follow up imaging. Also notified of incidental findings noted below and is encouraged to discuss further with PCP who will receive a copy of this note and/or the CT report. Patient verbalizes understanding.   IMPRESSION: 1. Lung-RADS 3, probably benign findings. Short-term follow-up in 6 months is recommended with repeat low-dose chest CT without contrast (please use the following order, "CT CHEST LCS NODULE FOLLOW-UP W/O CM"). 2. Aortic atherosclerosis (ICD10-I70.0), coronary artery atherosclerosis and emphysema (ICD10-J43.9).

## 2018-10-16 ENCOUNTER — Other Ambulatory Visit (INDEPENDENT_AMBULATORY_CARE_PROVIDER_SITE_OTHER): Payer: Self-pay | Admitting: Vascular Surgery

## 2018-10-16 DIAGNOSIS — I6521 Occlusion and stenosis of right carotid artery: Secondary | ICD-10-CM

## 2018-10-16 DIAGNOSIS — I6522 Occlusion and stenosis of left carotid artery: Secondary | ICD-10-CM

## 2018-10-20 ENCOUNTER — Ambulatory Visit (INDEPENDENT_AMBULATORY_CARE_PROVIDER_SITE_OTHER): Payer: Medicare HMO | Admitting: Vascular Surgery

## 2018-10-20 ENCOUNTER — Encounter (INDEPENDENT_AMBULATORY_CARE_PROVIDER_SITE_OTHER): Payer: Medicare HMO

## 2018-12-05 ENCOUNTER — Encounter (INDEPENDENT_AMBULATORY_CARE_PROVIDER_SITE_OTHER): Payer: Medicare HMO

## 2018-12-05 ENCOUNTER — Ambulatory Visit (INDEPENDENT_AMBULATORY_CARE_PROVIDER_SITE_OTHER): Payer: Medicare HMO | Admitting: Vascular Surgery

## 2019-01-02 ENCOUNTER — Other Ambulatory Visit: Payer: Self-pay

## 2019-01-02 ENCOUNTER — Ambulatory Visit (INDEPENDENT_AMBULATORY_CARE_PROVIDER_SITE_OTHER): Payer: Medicare HMO

## 2019-01-02 ENCOUNTER — Ambulatory Visit (INDEPENDENT_AMBULATORY_CARE_PROVIDER_SITE_OTHER): Payer: Medicare HMO | Admitting: Vascular Surgery

## 2019-01-02 ENCOUNTER — Encounter (INDEPENDENT_AMBULATORY_CARE_PROVIDER_SITE_OTHER): Payer: Self-pay | Admitting: Vascular Surgery

## 2019-01-02 VITALS — Resp 16 | Ht 63.0 in | Wt 205.0 lb

## 2019-01-02 DIAGNOSIS — I739 Peripheral vascular disease, unspecified: Secondary | ICD-10-CM | POA: Diagnosis not present

## 2019-01-02 DIAGNOSIS — I779 Disorder of arteries and arterioles, unspecified: Secondary | ICD-10-CM

## 2019-01-02 DIAGNOSIS — I6521 Occlusion and stenosis of right carotid artery: Secondary | ICD-10-CM | POA: Diagnosis not present

## 2019-01-02 DIAGNOSIS — E119 Type 2 diabetes mellitus without complications: Secondary | ICD-10-CM

## 2019-01-02 DIAGNOSIS — I10 Essential (primary) hypertension: Secondary | ICD-10-CM

## 2019-01-02 DIAGNOSIS — E785 Hyperlipidemia, unspecified: Secondary | ICD-10-CM

## 2019-01-02 DIAGNOSIS — I6522 Occlusion and stenosis of left carotid artery: Secondary | ICD-10-CM | POA: Diagnosis not present

## 2019-01-02 NOTE — Progress Notes (Signed)
MRN : [id number]  Michaela Wilcox is a 68 y.o. (September 16, 1950) female who presents with chief complaint of  Chief Complaint  Patient presents with  . Follow-up    ultrasound follow up  .  History of Present Illness: Patient returns in follow-up of her carotid disease.  She is doing well today without any focal neurologic deficits or other complaints.  No major changes in her health since last year. Right carotid artery velocities would fall in the 1 to 39% range.  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  Current Outpatient Medications  Medication Sig Dispense Refill  . albuterol (VENTOLIN HFA) 108 (90 Base) MCG/ACT inhaler Ventolin HFA 90 mcg/actuation aerosol inhaler    . aspirin EC 81 MG tablet Take 81 mg by mouth every morning.      Marland Kitchen atorvastatin (LIPITOR) 80 MG tablet atorvastatin 80 mg tablet    . clopidogrel (PLAVIX) 75 MG tablet clopidogrel 75 mg tablet    . fish oil-omega-3 fatty acids 1000 MG capsule Take 1 g by mouth 2 (two) times daily.    . furosemide (LASIX) 20 MG tablet Take by mouth.    Marland Kitchen glipiZIDE (GLUCOTROL) 10 MG tablet glipizide 10 mg tablet    . isosorbide mononitrate (IMDUR) 60 MG 24 hr tablet Take 60 mg by mouth daily.    Marland Kitchen levothyroxine (SYNTHROID, LEVOTHROID) 150 MCG tablet levothyroxine 150 mcg tablet    . naproxen sodium (ANAPROX) 220 MG tablet Take 220 mg by mouth 2 (two) times daily.      . pioglitazone (ACTOS) 45 MG tablet pioglitazone 45 mg tablet    . potassium chloride (K-DUR) 10 MEQ tablet Take 10 mEq by mouth daily as needed.    . pramipexole (MIRAPEX) 0.25 MG tablet pramipexole 0.25 mg tablet    . buPROPion (WELLBUTRIN XL) 150 MG 24 hr tablet Take 150 mg by mouth daily.    . colesevelam (WELCHOL) 625 MG tablet Take 1,875 mg by mouth 2 (two) times daily.      . Flaxseed, Linseed, (FLAXSEED OIL PO) Take 1 capsule by mouth 2 (two)  times daily.    Marland Kitchen linagliptin (TRADJENTA) 5 MG TABS tablet Tradjenta 5 mg tablet    . losartan (COZAAR) 50 MG tablet Take 50 mg by mouth every morning.      Marland Kitchen OVER THE COUNTER MEDICATION Take 1 tablet by mouth at bedtime. OTC niacin    . pregabalin (LYRICA) 75 MG capsule Take 75 mg by mouth 2 (two) times daily.      . rosuvastatin (CRESTOR) 20 MG tablet Take 20 mg by mouth at bedtime.      Marland Kitchen UNABLE TO FIND Incruse Ellipta 62.5 mcg/actuation powder for inhalation     No current facility-administered medications for this visit.     Past Medical History:  Diagnosis Date  . Breast cancer ([place]) 2005   left  . Cancer ([place]) 7/05   breast; mastectomy, chemo and radiation  . Carotid artery disease ([place])   . Coronary artery disease   . Diabetes mellitus   . High cholesterol    on statin  . Hypertension   . 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 OSA on CPAP 04/02/2011  . 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  APPENDECTOMY    . BREAST SURGERY    . CARDIAC CATHETERIZATION  2010   recath revealing non-critica CAD  . CARDIAC SURGERY    . carotid doppler  10/20/09   right bulb and ICA 50-69%,left CCA occluded  . CHOLECYSTECTOMY    . CORONARY ANGIOPLASTY  2009   s/p LAD AND CIRC STENT  . LAPAROTOMY  04/07/2011   Procedure: EXPLORATORY LAPAROTOMY;  Surgeon: Donato Heinz, MD;  Location: [facility];  Service: General;  Laterality: N/A;  . MASTECTOMY Left 2005  . NM MYOCAR PERF WALL MOTION  July 02, 2010   entirely normal  . THYROID SURGERY    . TUBAL LIGATION      Social History Social History   Tobacco Use  . Smoking status: Current Every Day Smoker    Packs/day: 1.00    Years: 33.00    Pack years: 33.00    Types: Cigarettes  . Smokeless tobacco: Never Used  Substance Use Topics  . Alcohol use: No  . Drug use: No     Family History Family  History  Problem Relation Age of Onset  . Crohn's disease Sister   . Breast cancer Neg Hx   no bleeding or clotting disorders  No Known Allergies    REVIEW OF SYSTEMS (Negative unless checked)  Constitutional: [] ?Weight loss  [] ?Fever  [] ?Chills Cardiac: [] ?Chest pain   [] ?Chest pressure   [] ?Palpitations   [] ?Shortness of breath when laying flat   [] ?Shortness of breath at rest   [x] ?Shortness of breath with exertion. Vascular:  [] ?Pain in legs with walking   [] ?Pain in legs at rest   [] ?Pain in legs when laying flat   [] ?Claudication   [] ?Pain in feet when walking  [] ?Pain in feet at rest  [] ?Pain in feet when laying flat   [] ?History of DVT   [] ?Phlebitis   [] ?Swelling in legs   [] ?Varicose veins   [] ?Non-healing ulcers Pulmonary:   [] ?Uses home oxygen   [] ?Productive cough   [] ?Hemoptysis   [x] ?Wheeze  [x] ?COPD   [] ?Asthma Neurologic:  [x] ?Dizziness  [] ?Blackouts   [] ?Seizures   [] ?History of stroke   [] ?History of TIA  [] ?Aphasia   [] ?Temporary blindness   [] ?Dysphagia   [] ?Weakness or numbness in arms   [] ?Weakness or numbness in legs Musculoskeletal:  [] ?Arthritis   [] ?Joint swelling   [] ?Joint pain   [] ?Low back pain Hematologic:  [] ?Easy bruising  [] ?Easy bleeding   [] ?Hypercoagulable state   [] ?Anemic  [] ?Hepatitis Gastrointestinal:  [] ?Blood in stool   [] ?Vomiting blood  [x] ?Gastroesophageal reflux/heartburn   [] ?Abdominal pain Genitourinary:  [] ?Chronic kidney disease   [] ?Difficult urination  [] ?Frequent urination  [] ?Burning with urination   [] ?Hematuria Skin:  [] ?Rashes   [] ?Ulcers   [] ?Wounds Psychological:  [] ?History of anxiety   [] ? History of major depression.   Physical Examination  Vitals:   01/02/19 1537  Resp: 16  Weight: 205 lb (93 kg)  Height: 5\' 3"  (1.6 m)   Body mass index is 36.31 kg/m. Gen:  WD/WN, NAD Head: [place]/AT, No temporalis wasting. Ear/Nose/Throat: Hearing grossly intact, nares w/o erythema or drainage, trachea midline Eyes: Conjunctiva  clear. Sclera non-icteric Neck: Supple.  Soft left carotid bruit  Pulmonary:  Good air movement, equal and clear to auscultation bilaterally.  Cardiac: RRR, No JVD Vascular:  Vessel Right Left  Radial Palpable Palpable            Musculoskeletal: M/S 5/5 throughout.  No deformity or atrophy.  Neurologic: CN 2-12 intact.  Sensation grossly intact in extremities.  Symmetrical.  Speech is fluent. Motor exam as listed above. Psychiatric: Judgment intact, Mood & affect appropriate for pt's clinical situation. Dermatologic: No rashes or ulcers noted.  No cellulitis or open wounds. Lymph : No Cervical, Axillary, or Inguinal lymphadenopathy.     CBC Lab Results  Component Value Date   WBC 8.9 04/13/2011   HGB 12.7 04/13/2011   HCT 37.8 04/13/2011   MCV 98.7 04/13/2011   PLT 327 04/13/2011    BMET    Component Value Date/Time   NA 135 04/13/2011 0627   K 6.1 (H) 04/13/2011 0627   CL 103 04/13/2011 0627   CO2 28 04/13/2011 0627   GLUCOSE 289 (H) 04/13/2011 0627   BUN 3 (L) 04/13/2011 0627   CREATININE 0.56 04/13/2011 0627   CALCIUM 8.9 04/13/2011 0627   GFRNONAA >90 04/13/2011 0627   GFRAA >90 04/13/2011 0627   CrCl cannot be calculated (Patient's most recent lab result is older than the maximum 21 days allowed.).  COAG Lab Results  Component Value Date   INR 1.04 04/01/2011    Radiology No results found.    Assessment/Plan HTN (hypertension) blood pressure control important in reducing the progression of atherosclerotic disease. On appropriate oral medications.   Hyperlipidemia lipid control important in reducing the progression of atherosclerotic disease. Continue statin therapy   DM type 2 (diabetes mellitus, type 2) blood glucose control important in reducing the progression of atherosclerotic disease. Also, involved in wound healing. On appropriate medications.  Carotid artery disease Right carotid artery velocities would fall in the 1 to 39% range.   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  This is basically unchanged from previous studies.  No intervention is required at this time.  Continue current medical regimen.  Recheck in 1 year    Leotis Pain, MD  01/02/2019 3:58 PM    This note was created with Dragon medical transcription system.  Any errors from dictation are purely unintentional

## 2019-01-02 NOTE — Assessment & Plan Note (Signed)
Right carotid artery velocities would fall in the 1 to 39% range.  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  This is basically unchanged from previous studies.  No intervention is required at this time.  Continue current medical regimen.  Recheck in 1 year

## 2019-03-07 ENCOUNTER — Telehealth: Payer: Self-pay | Admitting: *Deleted

## 2019-03-07 DIAGNOSIS — R918 Other nonspecific abnormal finding of lung field: Secondary | ICD-10-CM

## 2019-03-07 DIAGNOSIS — Z122 Encounter for screening for malignant neoplasm of respiratory organs: Secondary | ICD-10-CM

## 2019-03-07 DIAGNOSIS — Z87891 Personal history of nicotine dependence: Secondary | ICD-10-CM

## 2019-03-07 NOTE — Telephone Encounter (Signed)
Left a voicemail to inform Michaela Wilcox that it is time to schedule her 6 month follow up lung cancer screening CT scan. I told her to call back so we can get her scheduling preference for her next CT scan.

## 2019-03-21 ENCOUNTER — Encounter: Payer: Self-pay | Admitting: *Deleted

## 2019-03-21 NOTE — Addendum Note (Signed)
Addended by: Lieutenant Diego on: 03/21/2019 03:03 PM   Modules accepted: Orders

## 2019-03-21 NOTE — Telephone Encounter (Signed)
Left message for patient to notify them that it is time to schedule annual low dose lung cancer screening CT scan. Instructed patient to call back to verify information prior to the scan being scheduled.  

## 2019-03-21 NOTE — Telephone Encounter (Signed)
Patient is contacted and scheduled for LCS nodule follow up scan.

## 2019-03-23 ENCOUNTER — Ambulatory Visit
Admission: RE | Admit: 2019-03-23 | Discharge: 2019-03-23 | Disposition: A | Discharge status: Home / Self Care | Payer: Medicare HMO | Source: Ambulatory Visit | Attending: Nurse Practitioner | Admitting: Nurse Practitioner

## 2019-03-23 ENCOUNTER — Other Ambulatory Visit: Payer: Self-pay

## 2019-03-23 DIAGNOSIS — Z87891 Personal history of nicotine dependence: Secondary | ICD-10-CM | POA: Insufficient documentation

## 2019-03-23 DIAGNOSIS — R918 Other nonspecific abnormal finding of lung field: Secondary | ICD-10-CM | POA: Insufficient documentation

## 2019-03-23 DIAGNOSIS — Z122 Encounter for screening for malignant neoplasm of respiratory organs: Secondary | ICD-10-CM | POA: Insufficient documentation

## 2019-03-27 ENCOUNTER — Encounter: Payer: Self-pay | Admitting: *Deleted

## 2019-06-25 ENCOUNTER — Other Ambulatory Visit: Payer: Self-pay | Admitting: Family Medicine

## 2019-07-19 ENCOUNTER — Telehealth: Payer: Self-pay | Admitting: *Deleted

## 2019-07-19 NOTE — Progress Notes (Signed)
Attempted to reach patient x 2. Phone numbers not active #'s. Also attempted to reach patient at [phone]. No answer.

## 2019-07-19 NOTE — Telephone Encounter (Signed)
Attempted to reach patient x 2 numbers.  Unable to leave vm. Both phone numbers are not working phone numbers.

## 2019-07-20 ENCOUNTER — Inpatient Hospital Stay: Payer: Medicare HMO | Attending: Internal Medicine | Admitting: Internal Medicine

## 2019-07-20 ENCOUNTER — Encounter: Payer: Self-pay | Admitting: Internal Medicine

## 2019-07-20 ENCOUNTER — Inpatient Hospital Stay: Payer: Medicare HMO

## 2019-07-20 ENCOUNTER — Other Ambulatory Visit: Payer: Self-pay

## 2019-07-20 DIAGNOSIS — C50511 Malignant neoplasm of lower-outer quadrant of right female breast: Secondary | ICD-10-CM | POA: Insufficient documentation

## 2019-07-20 DIAGNOSIS — F17210 Nicotine dependence, cigarettes, uncomplicated: Secondary | ICD-10-CM | POA: Insufficient documentation

## 2019-07-20 DIAGNOSIS — M4854XA Collapsed vertebra, not elsewhere classified, thoracic region, initial encounter for fracture: Secondary | ICD-10-CM | POA: Insufficient documentation

## 2019-07-20 DIAGNOSIS — Z170 Estrogen receptor positive status [ER+]: Secondary | ICD-10-CM | POA: Insufficient documentation

## 2019-07-20 DIAGNOSIS — G620 Drug-induced polyneuropathy: Secondary | ICD-10-CM | POA: Diagnosis not present

## 2019-07-20 DIAGNOSIS — Z853 Personal history of malignant neoplasm of breast: Secondary | ICD-10-CM | POA: Diagnosis not present

## 2019-07-20 DIAGNOSIS — Z923 Personal history of irradiation: Secondary | ICD-10-CM | POA: Diagnosis not present

## 2019-07-20 DIAGNOSIS — D4861 Neoplasm of uncertain behavior of right breast: Secondary | ICD-10-CM

## 2019-07-20 DIAGNOSIS — Z9012 Acquired absence of left breast and nipple: Secondary | ICD-10-CM | POA: Insufficient documentation

## 2019-07-20 DIAGNOSIS — E1142 Type 2 diabetes mellitus with diabetic polyneuropathy: Secondary | ICD-10-CM | POA: Insufficient documentation

## 2019-07-20 NOTE — Assessment & Plan Note (Addendum)
#  Right breast cancer-clinical stage II T2N1 [biopsy-proven lymph node]; ER positive PR positive HER-2/neu negative; grade 3.   #Discussed with the patient that in general would recommend neoadjuvant chemotherapy followed by surgery.  In general followed by radiation based on pathology results.  #However given patient's previous chemotherapy-most likely limited by probable Adriamycin/and taxane [neuropathy].  My preference would be is to proceed with surgery upfront.  Based upon final surgical pathology-decisions regarding chemotherapy could be made.  Based upon the number of lymph nodes involved-genomic testing like Oncotype to decide on chemotherapy.  Decision regarding radiation to be made post surgery.  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  Stable.  #Diabetes-recent hemoglobin A1c 6.1-followed by PCP  #Active smoker-lung cancer screening CT scan Dec 2020-no acute process positive for emphysema.  Counseled the patient regarding smoking cessation.  #Compression fractures thoracic spine-MRI [march 2021;[facility]]-recommend management after treatment of her breast cancer.  Patient would benefit from bisphosphonates/bone density.  Thank you Dr. Tollie Pizza for allowing me to participate in the care of your pleasant patient. Please do not hesitate to contact me with questions or concerns in the interim.  # DISPOSITION: # follow up TBD- Dr.B

## 2019-07-20 NOTE — Progress Notes (Signed)
[facility] NOTE  Patient Care Team: Lorelee Market, MD as PCP - General (Family Medicine)  CHIEF COMPLAINTS/PURPOSE OF CONSULTATION: Breast cancer   Oncology History Overview Note  # MARCH 2021-invasive ductal ca-1.8 x.2.1 x2.5 s/p Bx- T2N1 [lymph node biopsy proven; [facility]] grade 3 -ER- 95%; PR-5%; her 2 Neu- NEG [Dr.Byrnett]  # 2005-left breast cancer s/p mastec [Taxol]; s/p RT; Triple negative [no endocrine therapy as per patient]  # PN-2 [sec to taxol]/back pain compression fractures [rolling walker]; lymphedema/compression stockings; diabetes   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, estrogen receptor positive ([place])  07/20/2019 Initial Diagnosis   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, estrogen receptor positive ([place])      HISTORY OF PRESENTING ILLNESS:  Michaela Wilcox 69 y.o.  female female with  prior history of breast cancer has been referred to [place] for further evaluation recommendations for new diagnosis of right breast cancer.  Patient noted to have a lump in the right breast approximately 1 month ago.  This was further followed by mammogram/ultrasound-and the subsequent biopsy at [facility]-results summarized above.  Patient's previous mammogram was 3 years ago.  With regards to previous history of breast cancer-left-sided in 2005.  Patient underwent mastectomy; also had chest wall radiation.  Patient received chemotherapy.  Remembers that she got taxane-as she continues to have persistent neuropathy.  Patient is currently walking with a rolling walker the last 3 months because of back pain.  Patient noted to have spontaneous compression fractures in the thoracic vertebrae-s/p evaluation at [facility] clinic MRI.  Given the new diagnosis of breast cancer further management is on hold.   Review of Systems  Constitutional: Negative for chills, diaphoresis, fever, malaise/fatigue and weight loss.  HENT: Negative for  nosebleeds and sore throat.   Eyes: Negative for double vision.  Respiratory: Positive for shortness of breath. Negative for cough, hemoptysis, sputum production and wheezing.   Cardiovascular: Negative for chest pain, palpitations, orthopnea and leg swelling.  Gastrointestinal: Negative for abdominal pain, blood in stool, constipation, diarrhea, heartburn, melena, nausea and vomiting.  Genitourinary: Negative for dysuria, frequency and urgency.  Musculoskeletal: Positive for back pain and joint pain.  Skin: Negative.  Negative for itching and rash.  Neurological: Negative for dizziness, tingling, focal weakness, weakness and headaches.  Endo/Heme/Allergies: Does not bruise/bleed easily.  Psychiatric/Behavioral: Negative for depression. The patient is not nervous/anxious and does not have insomnia.      MEDICAL HISTORY:  Past Medical History:  Diagnosis Date  . Breast cancer ([place]) 2005   left  . Cancer ([place]) 7/05   breast; mastectomy, chemo and radiation  . Carotid artery disease ([place])   . Coronary artery disease   . Diabetes mellitus   . High cholesterol    on statin  . Hypertension   . 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 OSA on CPAP 04/02/2011  . Peripheral vascular occlusive disease ([place])    occluded left internal carotid and moderate right ICAstenosis    SURGICAL HISTORY: Past Surgical History:  Procedure Laterality Date  . APPENDECTOMY    . BREAST SURGERY    . CARDIAC CATHETERIZATION  2010   recath revealing non-critica CAD  . CARDIAC SURGERY    . carotid doppler  10/20/09   right bulb and ICA 50-69%,left CCA occluded  . CHOLECYSTECTOMY    . CORONARY ANGIOPLASTY  2009   s/p LAD AND CIRC STENT  . LAPAROTOMY  04/07/2011   Procedure: EXPLORATORY LAPAROTOMY;  Surgeon: Donato Heinz, MD;  Location: [facility];  Service: General;  Laterality: N/A;  . MASTECTOMY Left 2005  . NM MYOCAR PERF WALL MOTION  July 02, 2010   entirely normal  . THYROID SURGERY    .  TUBAL LIGATION      SOCIAL HISTORY: Social History   Socioeconomic History  . Marital status: Divorced    Spouse name: Not on file  . Number of children: Not on file  . Years of education: Not on file  . Highest education level: Not on file  Occupational History  . Not on file  Tobacco Use  . Smoking status: Current Every Day Smoker    Packs/day: 1.00    Years: 34.00    Pack years: 34.00    Types: Cigarettes  . Smokeless tobacco: Never Used  . Tobacco comment: currently smoking less than a pack per day  Substance and Sexual Activity  . Alcohol use: No  . Drug use: No  . Sexual activity: Not on file  Other Topics Concern  . Not on file  Social History Narrative   Lives in [place]; [employer]; water treatment retd; son [48;[place]]; smoke <1ppd; no alcohol. Rolling walker- since starting 2021 [back pain]    Social Determinants of Health   Financial Resource Strain:   . Difficulty of Paying Living Expenses:   Food Insecurity:   . Worried About Charity fundraiser in the Last Year:   . Arboriculturist in the Last Year:   Transportation Needs:   . Film/video editor (Medical):   Marland Kitchen Lack of Transportation (Non-Medical):   Physical Activity:   . Days of Exercise per Week:   . Minutes of Exercise per Session:   Stress:   . Feeling of Stress :   Social Connections:   . Frequency of Communication with Friends and Family:   . Frequency of Social Gatherings with Friends and Family:   . Attends Religious Services:   . Active Member of Clubs or Organizations:   . Attends Archivist Meetings:   Marland Kitchen Marital Status:   Intimate Partner Violence:   . Fear of Current or Ex-Partner:   . Emotionally Abused:   Marland Kitchen Physically Abused:   . Sexually Abused:     FAMILY HISTORY: Family History  Problem Relation Age of Onset  . Crohn's disease Sister   . Breast cancer Neg Hx     ALLERGIES:  has No Known Allergies.  MEDICATIONS:  Current Outpatient Medications  Medication  Sig Dispense Refill  . ADVAIR DISKUS 250-50 MCG/DOSE AEPB Inhale 1 puff into the lungs in the morning and at bedtime.    Marland Kitchen albuterol (ACCUNEB) 1.25 MG/3ML nebulizer solution SMARTSIG:1 Vial(s) Via Nebulizer Twice Daily PRN    . atorvastatin (LIPITOR) 80 MG tablet atorvastatin 80 mg tablet    . buPROPion (WELLBUTRIN XL) 150 MG 24 hr tablet Take 150 mg by mouth daily.    . clopidogrel (PLAVIX) 75 MG tablet clopidogrel 75 mg tablet    . fish oil-omega-3 fatty acids 1000 MG capsule Take 1 g by mouth 2 (two) times daily.    . furosemide (LASIX) 20 MG tablet Take by mouth.    . gabapentin (NEURONTIN) 300 MG capsule Take 300 mg by mouth in the morning and at bedtime.    . INCRUSE ELLIPTA 62.5 MCG/INH AEPB Inhale 1 puff into the lungs daily.    . isosorbide mononitrate (IMDUR) 60 MG 24 hr tablet Take 60  mg by mouth daily.    Marland Kitchen levothyroxine (SYNTHROID, LEVOTHROID) 150 MCG tablet levothyroxine 150 mcg tablet    . pioglitazone (ACTOS) 45 MG tablet pioglitazone 45 mg tablet    . pramipexole (MIRAPEX) 0.25 MG tablet pramipexole 0.25 mg tablet     No current facility-administered medications for this visit.      Marland Kitchen  PHYSICAL EXAMINATION: ECOG PERFORMANCE STATUS: 0 - Asymptomatic  Vitals:   07/20/19 1445  BP: (!) 153/61  Pulse: 84  Resp: 20  Temp: 97.7 F (36.5 C)  SpO2: 97%   Filed Weights   07/20/19 1445  Weight: 201 lb (91.2 kg)    Physical Exam  Constitutional: She is oriented to person, place, and time and well-developed, well-nourished, and in no distress.  Accompanied by her cousin.  Walking with a rolling walker  HENT:  Head: Normocephalic and atraumatic.  Mouth/Throat: Oropharynx is clear and moist. No oropharyngeal exudate.  Eyes: Pupils are equal, round, and reactive to light.  Cardiovascular: Normal rate and regular rhythm.  Pulmonary/Chest: Effort normal and breath sounds normal. No respiratory distress. She has no wheezes.  Abdominal: Soft. Bowel sounds are normal. She  exhibits no distension and no mass. There is no abdominal tenderness. There is no rebound and no guarding.  Musculoskeletal:        General: No tenderness or edema. Normal range of motion.     Cervical back: Normal range of motion and neck supple.  Neurological: She is alert and oriented to person, place, and time.  Skin: Skin is warm.  Right BREAST exam (in the presence of patient's cousin)-approximately 1 inch mass noted in the outer lower quadrant.  No skin changes noted.  Left mastectomy noted   Psychiatric: Affect normal.     LABORATORY DATA:  I have reviewed the data as listed Lab Results  Component Value Date   WBC 8.9 04/13/2011   HGB 12.7 04/13/2011   HCT 37.8 04/13/2011   MCV 98.7 04/13/2011   PLT 327 04/13/2011   No results for input(s): NA, K, CL, CO2, GLUCOSE, BUN, CREATININE, CALCIUM, GFRNONAA, GFRAA, PROT, ALBUMIN, AST, ALT, ALKPHOS, BILITOT, BILIDIR, IBILI in the last 8760 hours.  RADIOGRAPHIC STUDIES: I have personally reviewed the radiological images as listed and agreed with the findings in the report. No results found.  ASSESSMENT & PLAN:   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, estrogen receptor positive ([place]) #Right breast cancer-clinical stage II T2N1 [biopsy-proven lymph node]; ER positive PR positive HER-2/neu negative; grade 3.   #Discussed with the patient that in general would recommend neoadjuvant chemotherapy followed by surgery.  In general followed by radiation based on pathology results.  #However given patient's previous chemotherapy-most likely limited by probable Adriamycin/and taxane [neuropathy].  My preference would be is to proceed with surgery upfront.  Based upon final surgical pathology-decisions regarding chemotherapy could be made.  Based upon the number of lymph nodes involved-genomic testing like Oncotype to decide on chemotherapy.  Decision regarding radiation to be made post surgery.  #Peripheral neuropathy  grade 1-2 on gabapentin.  Stable.  #Diabetes-recent hemoglobin A1c 6.1-followed by PCP  #Active smoker-lung cancer screening CT scan Dec 2020-no acute process positive for emphysema.  Counseled the patient regarding smoking cessation.  #Compression fractures thoracic spine-MRI [march 2021;[facility]]-recommend management after treatment of her breast cancer.  Patient would benefit from bisphosphonates/bone density.  Thank you Dr. Tollie Pizza for allowing me to participate in the care of your pleasant patient. Please do not hesitate to contact  me with questions or concerns in the interim.  # DISPOSITION: # follow up TBD- Dr.B  All questions were answered. The patient/family knows to call the clinic with any problems, questions or concerns.    Cammie Sickle, MD 07/20/2019 4:12 PM

## 2019-07-24 ENCOUNTER — Telehealth: Payer: Self-pay | Admitting: Internal Medicine

## 2019-07-24 ENCOUNTER — Other Ambulatory Visit: Payer: Self-pay | Admitting: Internal Medicine

## 2019-07-24 DIAGNOSIS — C50511 Malignant neoplasm of lower-outer quadrant of right female breast: Secondary | ICD-10-CM

## 2019-07-24 DIAGNOSIS — D4861 Neoplasm of uncertain behavior of right breast: Secondary | ICD-10-CM

## 2019-07-24 DIAGNOSIS — Z170 Estrogen receptor positive status [ER+]: Secondary | ICD-10-CM

## 2019-07-24 MED ORDER — LETROZOLE 2.5 MG PO TABS: 2.5000 mg | ORAL_TABLET | Freq: Every day | ORAL | 3 refills | Status: DC

## 2019-07-24 NOTE — Addendum Note (Signed)
Addended by: Gloris Ham on: 07/24/2019 09:29 AM   Modules accepted: Orders

## 2019-07-24 NOTE — Telephone Encounter (Signed)
On 4/12-spoke to patient regarding my discussion with Dr. Patrici Ranks a PET scan ASAP.  Also start the patient on Femara-while awaiting evaluation with PCP/regarding elevated TSH.  Prescription for Femara sent to pharmacy.  Discussed the potential side effects of Femara.  Schedule follow-up-MD; labs CBC CMP; thyroid profile-1 to 2 days post PET scan.  FYI-Dr.Byrnett.

## 2019-07-26 ENCOUNTER — Other Ambulatory Visit: Payer: Self-pay | Admitting: General Surgery

## 2019-07-30 ENCOUNTER — Ambulatory Visit: Payer: Medicare HMO

## 2019-07-31 ENCOUNTER — Other Ambulatory Visit: Payer: Self-pay

## 2019-08-01 ENCOUNTER — Encounter: Payer: Self-pay | Admitting: Internal Medicine

## 2019-08-01 ENCOUNTER — Inpatient Hospital Stay: Payer: Medicare HMO | Admitting: Internal Medicine

## 2019-08-01 ENCOUNTER — Inpatient Hospital Stay: Payer: Medicare HMO

## 2019-08-01 DIAGNOSIS — C50511 Malignant neoplasm of lower-outer quadrant of right female breast: Secondary | ICD-10-CM

## 2019-08-01 DIAGNOSIS — D4861 Neoplasm of uncertain behavior of right breast: Secondary | ICD-10-CM

## 2019-08-01 DIAGNOSIS — Z170 Estrogen receptor positive status [ER+]: Secondary | ICD-10-CM

## 2019-08-01 LAB — CBC WITH DIFFERENTIAL/PLATELET
Abs Immature Granulocytes: 0.01 10*3/uL (ref 0.00–0.07)
Basophils Absolute: 0 10*3/uL (ref 0.0–0.1)
Basophils Relative: 1 %
Eosinophils Absolute: 0.2 10*3/uL (ref 0.0–0.5)
Eosinophils Relative: 2 %
HCT: 40.9 % (ref 36.0–46.0)
Hemoglobin: 13.6 g/dL (ref 12.0–15.0)
Immature Granulocytes: 0 %
Lymphocytes Relative: 21 %
Lymphs Abs: 1.3 10*3/uL (ref 0.7–4.0)
MCH: 32.5 pg (ref 26.0–34.0)
MCHC: 33.3 g/dL (ref 30.0–36.0)
MCV: 97.6 fL (ref 80.0–100.0)
Monocytes Absolute: 0.4 10*3/uL (ref 0.1–1.0)
Monocytes Relative: 6 %
Neutro Abs: 4.4 10*3/uL (ref 1.7–7.7)
Neutrophils Relative %: 70 %
Platelets: 230 10*3/uL (ref 150–400)
RBC: 4.19 MIL/uL (ref 3.87–5.11)
RDW: 15.1 % (ref 11.5–15.5)
WBC: 6.4 10*3/uL (ref 4.0–10.5)
nRBC: 0 % (ref 0.0–0.2)

## 2019-08-01 LAB — COMPREHENSIVE METABOLIC PANEL
ALT: 15 U/L (ref 0–44)
AST: 22 U/L (ref 15–41)
Albumin: 3.9 g/dL (ref 3.5–5.0)
Alkaline Phosphatase: 67 U/L (ref 38–126)
Anion gap: 12 (ref 5–15)
BUN: 17 mg/dL (ref 8–23)
CO2: 28 mmol/L (ref 22–32)
Calcium: 9 mg/dL (ref 8.9–10.3)
Chloride: 100 mmol/L (ref 98–111)
Creatinine, Ser: 0.69 mg/dL (ref 0.44–1.00)
GFR calc Af Amer: >60 mL/min (ref >60)
GFR calc non Af Amer: >60 mL/min (ref >60)
Glucose, Bld: 204 mg/dL — ABNORMAL HIGH (ref 70–99)
Potassium: 3.9 mmol/L (ref 3.5–5.1)
Sodium: 140 mmol/L (ref 135–145)
Total Bilirubin: 0.9 mg/dL (ref 0.3–1.2)
Total Protein: 7.3 g/dL (ref 6.5–8.1)

## 2019-08-01 NOTE — Assessment & Plan Note (Addendum)
#  Right breast cancer-clinical stage II T2N1 [biopsy-proven lymph node]; ER positive PR positive HER-2/neu negative; grade 3. Currently on Femara- plan to proceed with surgery on 05/3; patient declines PET scan.  I discussed the rationale of the PET scan-we will help guide surgical options/lymph node evaluation.  Discussed with Dr. Tollie Pizza.  #Discussed with patient that we will discuss further treatment options based upon her final pathology.  Given patient's previous exposure to chemotherapy/ongoing neuropathy-I would recommend Oncotype to assess the benefit from chemotherapy. 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  Stable  #Active smoker-lung cancer screening CT scan Dec 2020.  #Compression fractures thoracic spine-MRI [march 2021;[facility]]-benefit from bisphosphonates.  # DISPOSITION: # follow up May 17th; MD- no labs- Dr.B

## 2019-08-01 NOTE — Progress Notes (Signed)
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  female right breast cancer ER/PR positive HER-2 negative; no prior history of breast cancer status post chemotherapy is here for follow-up.  Patient declined to have PET scan done.  States "tired of getting tests".   Patient started on letrozole approximately 2 weeks ago.  Denies having any major hot flashes or worsening joint pains.  In the interim patient has been followed by PCP regarding thyroid problems.  States that sometimes she does misses her Synthroid dose.  Review of Systems  Constitutional: Negative for chills, diaphoresis, fever, malaise/fatigue and weight loss.  HENT: Negative for nosebleeds and sore throat.   Eyes: Negative for double vision.  Respiratory: Positive for shortness of breath. Negative for cough, hemoptysis, sputum production and wheezing.   Cardiovascular: Negative for chest pain, palpitations, orthopnea and leg swelling.  Gastrointestinal: Negative for abdominal pain, blood in stool, constipation, diarrhea, heartburn, melena, nausea and vomiting.  Genitourinary: Negative for dysuria, frequency and urgency.   Musculoskeletal: Positive for back pain and joint pain.  Skin: Negative.  Negative for itching and rash.  Neurological: Negative for dizziness, tingling, focal weakness, weakness and headaches.  Endo/Heme/Allergies: Does not bruise/bleed easily.  Psychiatric/Behavioral: Negative for depression. The patient is not nervous/anxious and does not have insomnia.      MEDICAL HISTORY:  Past Medical History:  Diagnosis Date  . Breast cancer ([place]) 2005   left  . Cancer ([place]) 7/05   breast; mastectomy, chemo and radiation  . Carotid artery disease ([place])   . Coronary artery disease   . Diabetes mellitus   . High cholesterol    on statin  . Hypertension   . 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 OSA on CPAP 04/02/2011  . Peripheral vascular occlusive disease ([place])    occluded left internal carotid and moderate right ICAstenosis    SURGICAL HISTORY: Past Surgical History:  Procedure Laterality Date  . APPENDECTOMY    . BREAST SURGERY    . CARDIAC CATHETERIZATION  2010   recath revealing non-critica CAD  . CARDIAC SURGERY    . carotid doppler  10/20/09   right bulb and ICA 50-69%,left CCA occluded  . CHOLECYSTECTOMY    . CORONARY ANGIOPLASTY  2009   s/p LAD AND CIRC STENT  . LAPAROTOMY  04/07/2011   Procedure: EXPLORATORY LAPAROTOMY;  Surgeon: Donato Heinz, MD;  Location: [facility];  Service: General;  Laterality: N/A;  . MASTECTOMY Left 2005  . NM MYOCAR PERF WALL MOTION  July 02, 2010   entirely normal  . THYROID SURGERY    . TUBAL LIGATION      SOCIAL HISTORY: Social History   Socioeconomic History  . Marital status: Divorced  Spouse name: Not on file  . Number of children: Not on file  . Years of education: Not on file  . Highest education level: Not on file  Occupational History  . Not on file  Tobacco Use  . Smoking status: Current Every Day Smoker    Packs/day: 1.00    Years: 34.00    Pack years: 34.00    Types: Cigarettes  . Smokeless tobacco:  Never Used  . Tobacco comment: currently smoking less than a pack per day  Substance and Sexual Activity  . Alcohol use: No  . Drug use: No  . Sexual activity: Not on file  Other Topics Concern  . Not on file  Social History Narrative   Lives in [place]; [place]; water treatment retd; son [48;[place]]; smoke <1ppd; no alcohol. Rolling walker- since starting 2021 [back pain]    Social Determinants of Health   Financial Resource Strain:   . Difficulty of Paying Living Expenses:   Food Insecurity:   . Worried About Charity fundraiser in the Last Year:   . Arboriculturist in the Last Year:   Transportation Needs:   . Film/video editor (Medical):   Marland Kitchen Lack of Transportation (Non-Medical):   Physical Activity:   . Days of Exercise per Week:   . Minutes of Exercise per Session:   Stress:   . Feeling of Stress :   Social Connections:   . Frequency of Communication with Friends and Family:   . Frequency of Social Gatherings with Friends and Family:   . Attends Religious Services:   . Active Member of Clubs or Organizations:   . Attends Archivist Meetings:   Marland Kitchen Marital Status:   Intimate Partner Violence:   . Fear of Current or Ex-Partner:   . Emotionally Abused:   Marland Kitchen Physically Abused:   . Sexually Abused:     FAMILY HISTORY: Family History  Problem Relation Age of Onset  . Crohn's disease Sister   . Breast cancer Neg Hx     ALLERGIES:  has No Known Allergies.  MEDICATIONS:  Current Outpatient Medications  Medication Sig Dispense Refill  . ADVAIR DISKUS 250-50 MCG/DOSE AEPB Inhale 1 puff into the lungs in the morning and at bedtime.    Marland Kitchen albuterol (ACCUNEB) 1.25 MG/3ML nebulizer solution Take 1 ampule by nebulization in the morning and at bedtime.     Marland Kitchen atorvastatin (LIPITOR) 80 MG tablet Take 80 mg by mouth daily.     Marland Kitchen buPROPion (WELLBUTRIN XL) 150 MG 24 hr tablet Take 150 mg by mouth daily.    . clopidogrel (PLAVIX) 75 MG tablet Take 75 mg by mouth daily.      . furosemide (LASIX) 20 MG tablet Take 20 mg by mouth 2 (two) times daily.     Marland Kitchen gabapentin (NEURONTIN) 300 MG capsule Take 300 mg by mouth in the morning and at bedtime.    Marland Kitchen glipiZIDE (GLUCOTROL) 5 MG tablet Take 5 mg by mouth 2 (two) times daily before a meal.    . INCRUSE ELLIPTA 62.5 MCG/INH AEPB Inhale 1 puff into the lungs daily.    . isosorbide mononitrate (IMDUR) 60 MG 24 hr tablet Take 60 mg by mouth daily.    Marland Kitchen letrozole (FEMARA) 2.5 MG tablet Take 1 tablet (2.5 mg total) by mouth daily. Once a day. 90 tablet 3  . levothyroxine (SYNTHROID, LEVOTHROID) 150 MCG tablet Take 150 mcg by mouth daily before breakfast.     .  pioglitazone (ACTOS) 45 MG tablet Take 45 mg by mouth daily.     . pramipexole (MIRAPEX) 0.25 MG tablet Take 0.25 mg by mouth at bedtime.      No current facility-administered medications for this visit.      Marland Kitchen  PHYSICAL EXAMINATION: ECOG PERFORMANCE STATUS: 0 - Asymptomatic  Vitals:   08/01/19 1041  BP: (!) 160/77  Pulse: 76  Temp: 97.6 F (36.4 C)   Filed Weights   08/01/19 1041  Weight: 203 lb (92.1 kg)    Physical Exam  Constitutional: She is oriented to person, place, and time and well-developed, well-nourished, and in no distress.  Patient walking herself.   HENT:  Head: Normocephalic and atraumatic.  Mouth/Throat: Oropharynx is clear and moist. No oropharyngeal exudate.  Eyes: Pupils are equal, round, and reactive to light.  Cardiovascular: Normal rate and regular rhythm.  Pulmonary/Chest: Effort normal and breath sounds normal. No respiratory distress. She has no wheezes.  Abdominal: Soft. Bowel sounds are normal. She exhibits no distension and no mass. There is no abdominal tenderness. There is no rebound and no guarding.  Musculoskeletal:        General: No tenderness or edema. Normal range of motion.     Cervical back: Normal range of motion and neck supple.  Neurological: She is alert and oriented to person, place, and time.  Skin:  Skin is warm.  Psychiatric: Affect normal.     LABORATORY DATA:  I have reviewed the data as listed Lab Results  Component Value Date   WBC 6.4 08/01/2019   HGB 13.6 08/01/2019   HCT 40.9 08/01/2019   MCV 97.6 08/01/2019   PLT 230 08/01/2019   Recent Labs    08/01/19 1016  NA 140  K 3.9  CL 100  CO2 28  GLUCOSE 204*  BUN 17  CREATININE 0.69  CALCIUM 9.0  GFRNONAA >60  GFRAA >60  PROT 7.3  ALBUMIN 3.9  AST 22  ALT 15  ALKPHOS 67  BILITOT 0.9    RADIOGRAPHIC STUDIES: I have personally reviewed the radiological images as listed and agreed with the findings in the report. No results found.  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 Currently on Femara- plan to proceed with surgery on 05/3; patient declines PET scan.  I discussed the rationale of the PET scan-we will help guide surgical options/lymph node evaluation.  Discussed with Dr. Tollie Pizza.  #Discussed with patient that we will discuss further treatment options based upon her final pathology.  Given patient's previous exposure to chemotherapy/ongoing neuropathy-I would recommend Oncotype to assess the benefit from chemotherapy. 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  Stable  #Active smoker-lung cancer screening CT scan Dec 2020.  #Compression fractures thoracic spine-MRI [march 2021;[facility]]-benefit from bisphosphonates.  # DISPOSITION: # follow up May 17th; MD- no labs- Dr.B  All questions were answered. The patient/family knows to call the clinic with any problems, questions or concerns.    Cammie Sickle, MD 08/01/2019 12:52 PM

## 2019-08-02 LAB — THYROID PANEL WITH TSH
Free Thyroxine Index: 2.9 (ref 1.2–4.9)
T3 Uptake Ratio: 27 % (ref 24–39)
T4, Total: 10.7 ug/dL (ref 4.5–12.0)
TSH: 0.98 u[IU]/mL (ref 0.450–4.500)

## 2019-08-07 ENCOUNTER — Encounter
Admission: RE | Admit: 2019-08-07 | Discharge: 2019-08-07 | Disposition: A | Discharge status: Home / Self Care | Payer: Medicare HMO | Source: Ambulatory Visit | Attending: General Surgery | Admitting: General Surgery

## 2019-08-07 ENCOUNTER — Other Ambulatory Visit: Payer: Self-pay

## 2019-08-07 HISTORY — DX: Unspecified osteoarthritis, unspecified site: M19.90

## 2019-08-07 HISTORY — DX: Wedge compression fracture of unspecified thoracic vertebra, initial encounter for closed fracture: S22.000A

## 2019-08-07 HISTORY — DX: Emphysema, unspecified: J43.9

## 2019-08-07 HISTORY — DX: Gastro-esophageal reflux disease without esophagitis: K21.9

## 2019-08-07 NOTE — Pre-Procedure Instructions (Signed)
Progress Notes - documented in this encounter Michaela Glazier, MD - 08/17/2018 1:30 PM EDT Formatting of this note might be different from the original.     [name] PATIENT ENCOUNTER   Chief Complaint: Dyspnea with minimal exertion  HPI :  [name] is 69 y.o. female who was referred to [facility] clinic due to dyspnea. Patient has hx of discoids lupus treated with topical ointment. She has a hx of breast ca, s/p chemo 8 rounds, and radiation 12 rounds of radiation in 2005. 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 She smokes actively, started smoking at age 57 and went up to 2 packs daily but now is at 1ppd. Total of appx 43pack years. She has felt significant SOB which is hindering her ability to take care of her property. She has not been hospitalized for respiratory issues ever. She has not had any URI/LRTI in past year. She reports exetion has decreased to slow walking which is significanlty worse then before. She uses advair and incruse ellipta but reports 'no effect". She denies constitutional symptoms. Patient had left heart cath 2 years ago and received 2 stents with multiple obstructive vessels found. She had a TTE done recently which was essentially normal. She had a CBC and a BMP with normal kidney function at 0.9 creatinine but elevated hco3 suggestive of chronic co2 retentention and without anemia.   Past medical History: She has a past medical history of Breast cancer (CMS-HCC), Lupus (CMS-HCC), and Vertigo.  has No Known Allergies.  ROS: ROS 10 point ROS negative except for peripheral edema, lightheadedness. Remainder has been reviewed and is negative except as per HPI  Current Medications (including changes made at this visit) Current Outpatient Medications  Medication Sig Dispense Refill  . ADVAIR DISKUS 250-50 mcg/dose diskus inhaler INHALE 1 DOSE BY MOUTH TWICE DAILY  . albuterol (VENTOLIN  HFA) 90 mcg/actuation inhaler Ventolin HFA 90 mcg/actuation aerosol inhaler  . albuterol 90 mcg/actuation inhaler INHALE 2 PUFFS BY MOUTH EVERY 4 TO 6 HOURS AS NEEDED  . aspirin 81 MG EC tablet Take by mouth  . atorvastatin (LIPITOR) 80 MG tablet atorvastatin 80 mg tablet  . buPROPion (WELLBUTRIN XL) 150 MG XL tablet bupropion HCl XL 150 mg 24 hr tablet, extended release  . clopidogreL (PLAVIX) 75 mg tablet clopidogrel 75 mg tablet  . FUROsemide (LASIX) 20 MG tablet Take 2 tablets (40 mg total) by mouth once daily 60 tablet 11  . gabapentin (NEURONTIN) 300 MG capsule TAKE 1 CAPSULE BY MOUTH THREE TIMES DAILY  . glipiZIDE (GLUCOTROL) 10 MG tablet glipizide 10 mg tablet  . isosorbide mononitrate (IMDUR) 60 MG ER tablet isosorbide mononitrate ER 60 mg tablet,extended release 24 hr  . levothyroxine (SYNTHROID) 150 MCG tablet levothyroxine 150 mcg tablet  . linaGLIPtin (TRADJENTA) 5 mg tablet Tradjenta 5 mg tablet  . meloxicam (MOBIC) 7.5 MG tablet meloxicam 7.5 mg tablet  . metoprolol succinate (TOPROL-XL) 25 MG XL tablet Take 1 tablet (25 mg total) by mouth once daily 90 tablet 3  . omega-3 acid ethyl esters (LOVAZA) 1 gram capsule Take by mouth  . pioglitazone (ACTOS) 45 MG tablet pioglitazone 45 mg tablet  . pramipexole (MIRAPEX) 0.25 MG tablet pramipexole 0.25 mg tablet  . TOVIAZ 4 mg ER tablet  . triamcinolone 0.5 % cream triamcinolone acetonide 0.5 % topical cream  . umeclidinium (INCRUSE ELLIPTA) 62.5 mcg/actuation DsDv inhalation unit Incruse Ellipta 62.5 mcg/actuation powder for inhalation  No current facility-administered medications for this visit.   Social History: She reports that she has been smoking. She has never used smokeless tobacco. She reports that she does not drink alcohol or use drugs.  Surgical History: has a past surgical history that includes masectomy; resection small bowel; and gallbladder removed.  Family History: Her family history includes Cancer in her father;  Crohn's disease in her sister; No Known Problems in her mother and son.  Physical Exam: BP 120/65 (BP Location: Left upper arm, Patient Position: Sitting, BP Cuff Size: Adult)  Pulse 70  Temp 36.9 C (98.4 F) (Oral)  Wt 95.7 kg (211 lb)  SpO2 95%  BMI 36.22 kg/m  GENERAL: NAD, able to speak in complete sentences without dyspnea or cough HEENT: normocephalic, PERRL, EOMI, TM with sharp light reflex bilaterally. Normal external auditory canal. No hypertrophy of nasal turbinates. Clear mucosa in mouth without any exudates or erythema NECK: Supple, no jugular venous distension, nodes, stridor nor thyromegaly. Trachea midline.  CARDIOVASCULAR: RRR, no murmurs, gallops, rubs RESPIRATORY: normal respiratory effort, clear to auscultation bilaterally. No crackles, or wheezes GASTROINTESTINAL: NABS, soft, non tender, non distended EXTR: No edema, homans sign or cyanosis LYMPHATIC: No nodes in neck or supraclavicular areas INTEGUMENTARY: No rashes, bruising, telantagias, sclerodactaly nor alopecia NEURO: Cranial nerves, motor, sensation intact. Normal gait  Labs & Imaging:   Reviewed most recent chest imaging as well as serology  I have personally reviewed all above lab data and Imaging today and discussed pertinent findings with patient.   MedicalDecision Making:   Impression/Plan:     COPD grade - currently unstaged  Current inhalers: incruse and advair Smoking history and Cessation: today Social History   Tobacco Use  Smoking Status Current Every Day Smoker  Smokeless Tobacco Never Used  Ready to quit: Not Answered Counseling given: Not Answered  PFTs: Next visit Significant Imaging and Lab findings: none  Recent respiratory hospitalization None  Phenotype:  GERD complaints during visit today: none  Depression screening PHQ 2/9 last 3 flowsheet values  No flowsheet data found.  Depression Severity and Treatment Recommendations:  0-4= None  5-9= Mild / Treatment:  Support, educate to call if worse; return in one month  10-14= Moderate / Treatment: Support, watchful waiting; Antidepressant or Psychotherapy  15-19= Moderately severe / Treatment: Antidepressant OR Psychotherapy  >= 20 = Major depression, severe / Antidepressant AND Psychotherapy   Obstructive Sleep Apnea - on CPAP  Snorring: y Excessive datytime sleepiness: y Gasping or Choking during Sleep: y Morning Headaches n Trouble concentrating : y Depression: n Restlessness during Sleep: n  Epworth Score: 12 Neck circumference >16in F or >17in M: y Structural abnormalities: y  Obesity: y Caffeine intake : y Alcohol intake : y Medications : n  Symptoms of insomnia, hypersomnia, parasomnias, circadian rhythm disorder: n  CPAP candidate: claustrophobia, interface issues: n  -up to date on supplies  Tobacco abuse -counseling provided for >15 min today  -Plan to decrease smoking cigarettes by 1 cigarette/week -Plan to initiate Nicotrol inhaler for replacement as well as lozenge 4 mg to be used every 4 hours as needed -currently on wellbutrin  Lung cancer screening - will order low-dose CT chest to evaluate lung parenchyma for nodules as well as severity of emphysema  Patient relates understanding our goals for this visit and is agreeable to above plans.   Claudette Stapler, MD [place]  Division of Pulmonary & Critical Care Medicine   Electronically signed by Michaela Glazier, MD at  08/17/2018 2:48 PM EDT

## 2019-08-07 NOTE — Pre-Procedure Instructions (Signed)
Michaela Wilcox, [place] - 08/10/2018 9:00 AM EDT Formatting of this note is different from the original. Established Patient Visit   Chief Complaint: Chief Complaint  Patient presents with  . Follow-up Studies  ECHO AND MYOVIEW  Date of Service: 08/10/2018 Date of Birth: 1950-05-24 PCP: [facility]  History of Present Illness: Michaela Wilcox is a 69 y.o.female patient who returns today for  1. DES mid LAD 2. DES OM2, 2016 3. 70% distal RCA, 30% in-stent restenosis mid LAD, per cath 2016 4. Bilateral carotid artery stenosis 5. Essential hypertension 6. Type II diabetes 7. OSA 8. Systemic erythematosus lupus 9. History of CVA 10. COPD 71. Hyperlipidemia  12. Tobacco abuse  The patient returns today for evaluation of a 52-month history of progressive exertional shortness of breath, progressive peripheral edema, and weight gain. The patient saw her primary care provider on 06/16/2018 for these symptoms. Chest x-ray revealed cardiomegaly without pleural effusion or consolidation. She was prescribed Lasix 20 mg daily, and reports some initial improvement of her symptoms. She underwent Lexiscan Myoview on 07/31/2018. Gated scintigraphy revealed LVEF 65%. SPECT imaging revealed normal wall motion without evidence of scar or ischemia. 2D echocardiogram revealed LVEF 50-55% with moderate LVH, mild aortic stenosis with aortic valve area 1.25 cm, with mild mitral, tricuspid, and pulmonic regurgitation, with mild pulmonary hypertension. The patient reports exertional shortness of breath induced by minimal exertion with wheezing at times. She has gained 3 pounds since the last office visit. She denies chest pain. She denies presyncope or syncope. She continues to smoke and is not ready to stop at this time.  The patient has essential hypertension, blood pressure well controlled today, recently started on metoprolol succinate, which is well tolerated without apparent side effects. The patient tries  to follow a low-sodium, no added salt diet.  The patient has hyperlipidemia, currently on high-dose atorvastatin, which is well-tolerated without apparent side effects, followed by her primary care provider. LDL cholesterol was 86 on 06/17/2018.  The patient has type 2 diabetes, currently on Actos and Tradjenta. Hemoglobin A1c was 6.6 on 06/17/2018, followed by her primary care provider.  Past Medical and Surgical History  Past Medical History Past Medical History:  Diagnosis Date  . Breast cancer (CMS-HCC)  15 years ago  . Lupus (CMS-HCC)  . Vertigo   Past Surgical History She has a past surgical history that includes masectomy; resection small bowel; and gallbladder removed.   Medications and Allergies  Current Medications  Current Outpatient Medications  Medication Sig Dispense Refill  . ADVAIR DISKUS 250-50 mcg/dose diskus inhaler INHALE 1 DOSE BY MOUTH TWICE DAILY  . albuterol (VENTOLIN HFA) 90 mcg/actuation inhaler Ventolin HFA 90 mcg/actuation aerosol inhaler  . albuterol 90 mcg/actuation inhaler INHALE 2 PUFFS BY MOUTH EVERY 4 TO 6 HOURS AS NEEDED  . aspirin 81 MG EC tablet Take by mouth  . atorvastatin (LIPITOR) 80 MG tablet atorvastatin 80 mg tablet  . buPROPion (WELLBUTRIN XL) 150 MG XL tablet bupropion HCl XL 150 mg 24 hr tablet, extended release  . clopidogreL (PLAVIX) 75 mg tablet clopidogrel 75 mg tablet  . FUROsemide (LASIX) 20 MG tablet Take 2 tablets (40 mg total) by mouth once daily 60 tablet 11  . gabapentin (NEURONTIN) 300 MG capsule TAKE 1 CAPSULE BY MOUTH THREE TIMES DAILY  . glipiZIDE (GLUCOTROL) 10 MG tablet glipizide 10 mg tablet  . isosorbide mononitrate (IMDUR) 60 MG ER tablet isosorbide mononitrate ER 60 mg tablet,extended release 24 hr  . levothyroxine (SYNTHROID) 150  MCG tablet levothyroxine 150 mcg tablet  . linaGLIPtin (TRADJENTA) 5 mg tablet Tradjenta 5 mg tablet  . meloxicam (MOBIC) 7.5 MG tablet meloxicam 7.5 mg tablet  . metoprolol succinate  (TOPROL-XL) 25 MG XL tablet Take 1 tablet (25 mg total) by mouth once daily 90 tablet 3  . omega-3 acid ethyl esters (LOVAZA) 1 gram capsule Take by mouth  . pioglitazone (ACTOS) 45 MG tablet pioglitazone 45 mg tablet  . potassium chloride (KLOR-CON) 10 MEQ ER tablet TAKE 1 TABLET BY MOUTH ONCE DAILY AS NEEDED TAKE WHILE TAKING LASIX  . pramipexole (MIRAPEX) 0.25 MG tablet pramipexole 0.25 mg tablet  . TOVIAZ 4 mg ER tablet  . triamcinolone 0.5 % cream triamcinolone acetonide 0.5 % topical cream  . umeclidinium (INCRUSE ELLIPTA) 62.5 mcg/actuation DsDv inhalation unit Incruse Ellipta 62.5 mcg/actuation powder for inhalation   No current facility-administered medications for this visit.   Allergies: Patient has no known allergies.  Social and Family History  Social History reports that she has been smoking. She has never used smokeless tobacco. She reports that she does not drink alcohol or use drugs.  Family History Family History  Problem Relation Age of Onset  . No Known Problems Mother  . Cancer Father  . Crohn's disease Sister  . No Known Problems Son   Review of Systems   Review of Systems: The patient denies chest pain, reports progressive exertional shortness of breath, without orthopnea, paroxysmal nocturnal dyspnea, with pedal edema, without palpitations, heart racing, presyncope, syncope. Review of 10 Systems is negative except as described above.  Physical Examination   Vitals:BP 132/70  Pulse 72  Ht 162.6 cm (5\' 4" )  Wt 96.6 kg (213 lb)  SpO2 95%  BMI 36.56 kg/m  Ht:162.6 cm (5\' 4" ) Wt:96.6 kg (213 lb) ER:[id number] surface area is 2.09 meters squared. Body mass index is 36.56 kg/m.  General: Alert and oriented. Well-appearing. No acute distress. HEENT: Pupils equally reactive to light and accomodation  Neck: Supple, no JVD Lungs: Normal effort of breathing; clear to auscultation bilaterally; no wheezes, rales, rhonchi Heart: Regular rate and rhythm. No murmur,  rub, or gallop Abdomen: nondistended Extremities: no cyanosis, clubbing, or edema Peripheral Pulses: 2+ radial  Skin: Warm, dry, no diaphoresis  Assessment   69 y.o. female with  1. Shortness of breath  2. Medication management  3. Coronary artery disease involving native coronary artery of native heart without angina pectoris  4. Hyperlipidemia, unspecified hyperlipidemia type  5. Presence of stent in LAD coronary artery  6. Peripheral edema   69 year old female with a history of coronary artery disease, status post DES of LAD and OM 2, hypertension, type 2 diabetes, hyperlipidemia, COPD, and ongoing tobacco abuse, with a 10-month history of progressive weight gain, peripheral edema, and exertional shortness of breath. Lexiscan Myoview revealed normal LV function with no evidence of scar or ischemia. 2D echocardiogram revealed LVEF 50-55% with mild aortic stenosis, mild MR, TR, and PR with mild pulmonary hypertension. The patient was originally prescribed Lasix 20 mg daily, and had some improvement in her symptoms, though no back to baseline. The patient has essential hypertension with good blood pressure control on metoprolol and Lasix.  Plan   1. Increase furosemide to 40 mg daily 2. Counseled patient about low sodium diet 3. DASH diet printed instructions given to the patient 4. Continue atorvastatin for hyperlipidemia management 5. Encouraged patient to stop smoking and offered smoking cessation methods, though she is not ready to quit at this time. 6.  Referral to pulmonary 7. Will call patient in 2 weeks to assess symptoms after increased dose of furosemide 8. BMP today 9. Return to clinic for follow-up in 6 weeks.  Orders Placed This Encounter  Procedures  . Basic Metabolic Panel (BMP)   Return in about 6 weeks (around 09/21/2018). I personally performed the service, non-incident to. [facility])   ANNA Shirlee More, PA-C    Electronically signed by Michaela Decamp, PA at  08/10/2018 12:38 PM EDT

## 2019-08-07 NOTE — Pre-Procedure Instructions (Signed)
Echo complete4/28/2020 [place] Component Name Value Ref Range  LV Ejection Fraction (%) 50   Aortic Valve Stenosis Grade mild   Aortic Valve Regurgitation Grade none   Aortic Valve Max Velocity (m/s) 2.6 m/sec  Aortic Valve Stenosis Mean Gradient (mmHg) 12.7 mmHg  Mitral Valve Stenosis Grade none   Mitral Valve Regurgitation Grade mild   Tricuspid Valve Regurgitation Grade mild   Tricuspid Valve Regurgitation Max Velocity (m/s) 2.8 m/sec  Right Ventricle Systolic Pressure (mmHg) 123456 mmHg  LV End Diastolic Diameter (cm) 5 cm  LV End Systolic Diameter (cm) 3.7 cm  LV Septum Wall Thickness (cm) 1.5 cm  LV Posterior Wall Thickness (cm) 1.2 cm  Left Atrium Diameter (cm) 4 cm  Result Narrative             CARDIOLOGY DEPARTMENT          [facility], [facility]                  [id number]      [name]              Acct #: [id number]      Michaela Wilcox, Michaela Wilcox, Michaela Wilcox [id number]    Date: 07/31/2018 10: 58 AM                                Adult  Female  Age: 69 yrs      ECHOCARDIOGRAM REPORT               Outpatient                                [name]    STUDY:CHEST WALL        TAPE:          MD1: PARASCHOS, ALEXANDER    ECHO:Yes  DOPPLER:Yes    FILE:          BP: 142/80 mmHg    COLOR:Yes  CONTRAST:No   MACHINE:Philips  RV BIOPSY:No     3D:No SOUND QLTY:Moderate      Height: 64 in   MEDIUM:None                       Weight: 210 lb                                BSA: 2.0 m2 _________________________________________________________________________________________        HISTORY: DOE         REASON: Assess, LV function       INDICATION: , Shortness of breath  [R06.02 (ICD-10-CM)]  , _________________________________________________________________________________________ ECHOCARDIOGRAPHIC MEASUREMENTS 2D DIMENSIONS AORTA         Values  Normal Range  MAIN PA     Values  Normal Range        Annulus: 1.7 cm    [2.1-2.5]     PA Main: nm*    [1.5-2.1]       Aorta Sin: 2.9 cm    [2.7-3.3]  RIGHT VENTRICLE      ST Junction: nm*     [2.3-2.9]     RV Base: nm*    [<4.2]       Asc.Aorta: nm*     [2.3-3.1]  RV Mid: 3.3 cm  [<3.5] LEFT VENTRICLE                   RV Length: nm*    [<8.6]         LVIDd: 5.0 cm    [3.9-5.3]  INFERIOR VENA CAVA         LVIDs: 3.7 cm            Max. IVC: nm*    [<=2.1]           FS: 26.6 %    [>25]      Min. IVC: nm*          SWT: 1.5 cm    [0.5-0.9]  ------------------          PWT: 1.2 cm    [0.5-0.9]  nm* - not measured LEFT ATRIUM        LA Diam: 4.0 cm    [2.7-3.8]      LA A4C Area: nm*     [<20]       LA Volume: nm*     [22-52] _________________________________________________________________________________________ ECHOCARDIOGRAPHIC DESCRIPTIONS AORTIC ROOT          Size: Normal       Dissection: INDETERM FOR DISSECTION AORTIC VALVE        Leaflets: Tricuspid          Morphology: MILDLY THICKENED        Mobility: Fully mobile LEFT VENTRICLE          Size: Normal            Anterior: Normal      Contraction: Normal             Lateral: Normal       Closest EF: 50% (Estimated)         Septal: Normal       LV Masses: No Masses            Apical: Normal          LVH: MODERATE LVH         Inferior: Normal                           Posterior: Normal       Dias.FxClass: N/A MITRAL VALVE        Leaflets: Normal            Mobility: Fully mobile       Morphology: Normal LEFT ATRIUM          Size: Normal            LA Masses: No masses       IA Septum: Normal IAS MAIN PA          Size: Normal PULMONIC VALVE       Morphology: Normal            Mobility: Fully mobile RIGHT VENTRICLE       RV Masses: No Masses             Size: Normal       Free Wall: Normal           Contraction: Normal TRICUSPID VALVE        Leaflets: Normal            Mobility: Fully mobile       Morphology: Normal RIGHT ATRIUM          Size: Normal  RA Other: None        RA Mass: No masses PERICARDIUM         Fluid: No effusion INFERIOR VENACAVA          Size: Normal Normal respiratory collapse _________________________________________________________________________________________  DOPPLER ECHO and OTHER SPECIAL PROCEDURES         Aortic: No AR           MILD AS             264.0 cm/sec peak vel   27.9 mmHg peak grad             12.7 mmHg mean grad         Mitral: MILD MR          No MS             3.6 cm^2 by DOPPLER             MV Inflow E Vel = 133.0 cm/sec   MV Annulus E'Vel = 4.9 cm/sec             E/E'Ratio = 27.1       Tricuspid: MILD TR          No TS             282.3 cm/sec peak TR vel  36.9 mmHg peak RV pressure       Pulmonary: MILD PR          No PS             114.0 cm/sec peak vel   5.2 mmHg peak grad _________________________________________________________________________________________ INTERPRETATION NORMAL LEFT VENTRICULAR SYSTOLIC FUNCTION  WITH MODERATE LVH NORMAL RIGHT VENTRICULAR SYSTOLIC FUNCTION MILD  VALVULAR REGURGITATION (See above) MILD VALVULAR STENOSIS (See above) AVA(VTI)=1.25cm^2 MILD MR, TR, PR MILD PHTN MILD AS EF 50-55% _________________________________________________________________________________________ Electronically signed by   MD Miquel Dunn on 08/08/2018 11: 00 AM      Performed By: [name]   Ordering Physician: [name] _________________________________________________________________________________________  Other Result Information  Interface, Text Results In - 08/08/2018 11:01 AM EDT                      CARDIOLOGY DEPARTMENT                   Michaela Wilcox, Michaela Wilcox                                    [id number]           [place] #: [id number]           [address], [place], [place] [address]       Date: 07/31/2018 10: 38 AM                                                              Adult   Female   Age: 27 yrs  ECHOCARDIOGRAM REPORT                              Outpatient                                                              [facility]      STUDY:CHEST WALL               TAPE:                    MD1: PARASCHOS, ALEXANDER       ECHO:Yes    DOPPLER:Yes       FILE:                    BP: 142/80 mmHg      COLOR:Yes   CONTRAST:No     MACHINE:Philips  RV BIOPSY:No          3D:No  SOUND QLTY:Moderate            Height: 64 in     MEDIUM:None                                              Weight: 210 lb                                                              BSA: 2.0 m2 _________________________________________________________________________________________               HISTORY: DOE                REASON: Assess, LV function            INDICATION: , Shortness of breath [R06.02 (ICD-10-CM)]    , _________________________________________________________________________________________ ECHOCARDIOGRAPHIC MEASUREMENTS 2D DIMENSIONS AORTA                  Values    Normal Range   MAIN PA         Values    Normal Range               Annulus: 1.7 cm       [2.1-2.5]         PA Main: nm*       [1.5-2.1]             Aorta Sin: 2.9 cm       [2.7-3.3]    RIGHT VENTRICLE           ST Junction: nm*          [2.3-2.9]         RV Base: nm*       [<4.2]             Asc.Aorta: nm*          [2.3-3.1]          RV Mid: 3.3 cm    [<3.5] LEFT VENTRICLE  RV Length: nm*       [<8.6]                 LVIDd: 5.0 cm       [3.9-5.3]    INFERIOR VENA CAVA                 LVIDs: 3.7 cm                        Max. IVC: nm*       [<=2.1]                    FS: 26.6 %       [>25]            Min. IVC: nm*                   SWT: 1.5 cm       [0.5-0.9]    ------------------                   PWT: 1.2 cm       [0.5-0.9]    nm* - not measured LEFT ATRIUM               LA Diam: 4.0 cm       [2.7-3.8]           LA A4C Area: nm*          [<20]             LA Volume: nm*          [22-52] _________________________________________________________________________________________ ECHOCARDIOGRAPHIC DESCRIPTIONS AORTIC ROOT                  Size: Normal            Dissection: INDETERM FOR DISSECTION AORTIC VALVE              Leaflets: Tricuspid                   Morphology: MILDLY THICKENED              Mobility: Fully mobile LEFT VENTRICLE                  Size: Normal                        Anterior: Normal           Contraction: Normal                         Lateral: Normal            Closest EF: 50% (Estimated)                 Septal: Normal             LV Masses: No Masses                       Apical: Normal                   LVH: MODERATE LVH                  Inferior: Normal  Posterior: Normal          Dias.FxClass: N/A MITRAL VALVE              Leaflets: Normal                        Mobility: Fully mobile            Morphology: Normal LEFT ATRIUM                  Size: Normal                        LA Masses: No masses             IA Septum: Normal IAS MAIN PA                  Size: Normal PULMONIC VALVE            Morphology: Normal                        Mobility: Fully mobile RIGHT VENTRICLE             RV Masses: No Masses                         Size: Normal             Free Wall: Normal                     Contraction: Normal TRICUSPID VALVE              Leaflets: Normal                        Mobility: Fully mobile            Morphology: Normal RIGHT ATRIUM                  Size: Normal                        RA Other: None               RA Mass: No masses PERICARDIUM                 Fluid: No effusion INFERIOR VENACAVA                  Size: Normal Normal respiratory collapse _________________________________________________________________________________________  DOPPLER ECHO and OTHER SPECIAL PROCEDURES                Aortic: No AR                      MILD AS                        264.0 cm/sec peak vel      27.9 mmHg peak grad                        12.7 mmHg mean grad                Mitral: MILD MR                    No MS  3.6 cm^2 by DOPPLER                        MV Inflow E Vel = 133.0 cm/sec      MV Annulus E'Vel = 4.9 cm/sec                        E/E'Ratio = 27.1             Tricuspid: MILD TR                    No TS                        282.3 cm/sec peak TR vel   36.9 mmHg peak RV pressure             Pulmonary: MILD PR                    No PS                        114.0 cm/sec peak vel      5.2 mmHg peak grad _________________________________________________________________________________________ INTERPRETATION NORMAL LEFT VENTRICULAR SYSTOLIC FUNCTION   WITH MODERATE LVH NORMAL RIGHT VENTRICULAR SYSTOLIC FUNCTION MILD VALVULAR REGURGITATION (See above) MILD VALVULAR STENOSIS (See above) AVA(VTI)=1.25cm^2 MILD MR, TR, PR MILD PHTN MILD AS EF  50-55% _________________________________________________________________________________________ Electronically signed by      MD Miquel Dunn on 08/08/2018 11: 00 AM          Performed By: [name]    Ordering Physician: [name] _________________________________________________________________________________________  Status Results Details   Encounter Summary

## 2019-08-07 NOTE — Patient Instructions (Addendum)
Your procedure is scheduled on:  08-13-19 MONDAY Report to Same Day Surgery 2nd floor medical mall [facility] Entrance-take elevator on left to 2nd floor.  Check in with surgery information desk.) To find out your arrival time please call [phone] between 1PM - 3PM on 08-10-19 FRIDAY  Remember: Instructions that are not followed completely may result in serious medical risk, up to and including death, or upon the discretion of your surgeon and anesthesiologist your surgery may need to be rescheduled.    _x___ 1. Do not eat food after midnight the night before your procedure. NO GUM OR CANDY AFTER MIDNIGHT. You may drink WATER up to 2 hours before you are scheduled to arrive at the hospital for your procedure.  Do not drink WATER within 2 hours of your scheduled arrival to the hospital.  Type 1 and type 2 diabetics should only drink water.   ____Ensure clear carbohydrate drink on the way to the hospital for bariatric patients  ____Ensure clear carbohydrate drink 3 hours before surgery.    __x__ 2. No Alcohol for 24 hours before or after surgery.   __x__3. No Smoking or e-cigarettes for 24 prior to surgery.  Do not use any chewable tobacco products for at least 6 hour prior to surgery   ____  4. Bring all medications with you on the day of surgery if instructed.    __x__ 5. Notify your doctor if there is any change in your medical condition     (cold, fever, infections).    x___6. On the morning of surgery brush your teeth with toothpaste and water.  You may rinse your mouth with mouth wash if you wish.  Do not swallow any toothpaste or mouthwash.   Do not wear jewelry, make-up, hairpins, clips or nail polish.  Do not wear lotions, powders, or perfumes.   Do not shave 48 hours prior to surgery. Men may shave face and neck.  Do not bring valuables to the hospital.    [facility] is not responsible for any belongings or valuables.               Contacts, dentures or bridgework may  not be worn into surgery.  Leave your suitcase in the car. After surgery it may be brought to your room.  For patients admitted to the hospital, discharge time is determined by your treatment team.  _  Patients discharged the day of surgery will not be allowed to drive home.  You will need someone to drive you home and stay with you the night of your procedure.    Please read over the following fact sheets that you were given:   [facility] Preparing for Surgery  _x___ TAKE THE FOLLOWING MEDICATION THE MORNING OF SURGERY WITH A SMALL SIP OF WATER. These include:  1. GABAPENTIN (NEURONTIN)  2. SYNTHROID (LEVOTHYROXINE)  3. WELLBUTRIN (BUPROPION)  4. IMDUR (ISOSORBIDE)  5. FEMARA (LETROZOLE)  6. PRILOSEC (OMEPRAZOLE)  7. TAKE AN EXTRA PRILOSEC THE NIGHT BEFORE YOUR SURGERY  ____Fleets enema or Magnesium Citrate as directed.   _x___ Use CHG Soap or sage wipes as directed on instruction sheet   _X___ Use inhalers on the day of surgery and bring to hospital day of surgery-USE YOUR ALBUTEROL NEBULIZER AND YOUR ADVAIR AND INCRUSE ELLIPTA THE MORNING OF SURGERY  ____ Stop Metformin and Janumet 2 days prior to surgery.    ____ Take 1/2 of usual insulin dose the night before surgery and none on the morning surgery.  _x___ Follow recommendations from Cardiologist, Pulmonologist or PCP regarding stopping Aspirin, Coumadin, Plavix ,Eliquis, Effient, or Pradaxa, and Pletal-STOPPED PLAVIX ON 08-04-19 AS INSTRUCTED BY DR BYRNETT-OK TO CONTINUE BABY ASPIRIN-DO NOT TAKE AM OF SURGERY  X____Stop Anti-inflammatories such as Advil, Aleve, Ibuprofen, Motrin, Naproxen, Naprosyn, Goodies powders or aspirin products NOW-OK to take Tylenol   ____ Stop supplements until after surgery.     _X___ Bring C-Pap to the hospital.

## 2019-08-07 NOTE — Pre-Procedure Instructions (Signed)
Progress Notes - documented in this encounter Michaela Cowman, MD - 09/21/2018 11:00 AM EDT Formatting of this note might be different from the original. Established Patient Visit   Chief Complaint: Chief Complaint  Patient presents with  . Follow-up  6 weeks-doing well  Date of Service: 09/21/2018 Date of Birth: 07/17/1950 PCP: [facility]  History of Present Illness: Michaela Wilcox is a 69 y.o.female patient who returns today for  1. DES mid LAD 2. DES OM2, 2016 3. 70% distal RCA, 30% in-stent restenosis mid LAD, per cath 2016 4. Bilateral carotid artery stenosis 5. Essential hypertension 6. Type II diabetes 7. OSA 8. Systemic erythematosus lupus 9. History of CVA 10. COPD 67. Hyperlipidemia  12. Tobacco abuse  The patient returns today for evaluation of a 56-month history of progressive exertional shortness of breath, progressive peripheral edema, and weight gain. The patient saw her primary care provider on 06/16/2018 for these symptoms. Chest x-ray revealed cardiomegaly without pleural effusion or consolidation. She was prescribed Lasix 20 mg daily, and reports some initial improvement of her symptoms. She underwent Lexiscan Myoview on 07/31/2018. Gated scintigraphy revealed LVEF 65%. SPECT imaging revealed normal wall motion without evidence of scar or ischemia. 2D echocardiogram revealed LVEF 50-55% with moderate LVH, mild aortic stenosis with aortic valve area 1.25 cm, with mild mitral, tricuspid, and pulmonic regurgitation, with mild pulmonary hypertension. During her last office visit, furosemide was increased to 40 mg daily with modest overall clinical improvement. She continues to experience exertional dyspnea. Peripheral edema has improved. He actually has gained 3 pounds since her last office visit. Also continues to smoke a pack of cigarettes per day.  The patient has essential hypertension, blood pressure well controlled today, recently started on metoprolol  succinate, which is well tolerated without apparent side effects. The patient tries to follow a low-sodium, no added salt diet.  The patient has hyperlipidemia, currently on high-dose atorvastatin, which is well-tolerated without apparent side effects, followed by her primary care provider. LDL cholesterol was 86 on 06/17/2018.  The patient has type 2 diabetes, currently on Actos and Tradjenta. Hemoglobin A1c was 6.6 on 06/17/2018, followed by her primary care provider.  Past Medical and Surgical History  Past Medical History Past Medical History:  Diagnosis Date  . Breast cancer (CMS-HCC)  15 years ago  . Lupus (CMS-HCC)  . Vertigo   Past Surgical History She has a past surgical history that includes masectomy; resection small bowel; and gallbladder removed.   Medications and Allergies  Current Medications  Current Outpatient Medications  Medication Sig Dispense Refill  . ADVAIR DISKUS 250-50 mcg/dose diskus inhaler INHALE 1 DOSE BY MOUTH TWICE DAILY  . albuterol (VENTOLIN HFA) 90 mcg/actuation inhaler Ventolin HFA 90 mcg/actuation aerosol inhaler  . albuterol 90 mcg/actuation inhaler INHALE 2 PUFFS BY MOUTH EVERY 4 TO 6 HOURS AS NEEDED  . aspirin 81 MG EC tablet Take by mouth  . atorvastatin (LIPITOR) 80 MG tablet atorvastatin 80 mg tablet  . buPROPion (WELLBUTRIN XL) 150 MG XL tablet bupropion HCl XL 150 mg 24 hr tablet, extended release  . clopidogreL (PLAVIX) 75 mg tablet clopidogrel 75 mg tablet  . FUROsemide (LASIX) 20 MG tablet Take 2 tablets (40 mg total) by mouth once daily 60 tablet 11  . gabapentin (NEURONTIN) 300 MG capsule TAKE 1 CAPSULE BY MOUTH THREE TIMES DAILY  . glipiZIDE (GLUCOTROL) 10 MG tablet glipizide 10 mg tablet  . isosorbide mononitrate (IMDUR) 60 MG ER tablet isosorbide mononitrate ER 60 mg tablet,extended release  24 hr  . levothyroxine (SYNTHROID) 150 MCG tablet levothyroxine 150 mcg tablet  . linaGLIPtin (TRADJENTA) 5 mg tablet Tradjenta 5 mg tablet  .  meloxicam (MOBIC) 7.5 MG tablet meloxicam 7.5 mg tablet  . metoprolol succinate (TOPROL-XL) 25 MG XL tablet Take 1 tablet (25 mg total) by mouth once daily 90 tablet 3  . nicotine (NICOTROL) 10 mg cartridge for inhalation Inhale 2 inhalations into the lungs as needed for up to 90 days 168 each 11  . nicotine polacrilex (NICORETTE) 4 MG lozenge Place 1 lozenge (4 mg total) inside cheek every 4 (four) hours as needed for up to 90 days 108 lozenge 11  . omega-3 acid ethyl esters (LOVAZA) 1 gram capsule Take by mouth  . pioglitazone (ACTOS) 45 MG tablet pioglitazone 45 mg tablet  . pramipexole (MIRAPEX) 0.25 MG tablet pramipexole 0.25 mg tablet  . TOVIAZ 4 mg ER tablet  . triamcinolone 0.5 % cream triamcinolone acetonide 0.5 % topical cream  . umeclidinium (INCRUSE ELLIPTA) 62.5 mcg/actuation DsDv inhalation unit Incruse Ellipta 62.5 mcg/actuation powder for inhalation   No current facility-administered medications for this visit.   Allergies: Patient has no known allergies.  Social and Family History  Social History reports that she has been smoking. She has never used smokeless tobacco. She reports that she does not drink alcohol or use drugs.  Family History Family History  Problem Relation Age of Onset  . No Known Problems Mother  . Cancer Father  . Crohn's disease Sister  . No Known Problems Son   Review of Systems   Review of Systems: The patient denies chest pain, reports progressive exertional shortness of breath, without orthopnea, paroxysmal nocturnal dyspnea, with pedal edema, without palpitations, heart racing, presyncope, syncope. Review of 10 Systems is negative except as described above.  Physical Examination   Vitals:BP 142/62  Pulse 64  Ht 162.6 cm (5\' 4" )  Wt 98.2 kg (216 lb 6.4 oz)  SpO2 97%  BMI 37.14 kg/m  Ht:162.6 cm (5\' 4" ) Wt:98.2 kg (216 lb 6.4 oz) [id number] surface area is 2.11 meters squared. Body mass index is 37.14 kg/m.  General: Alert and  oriented. Well-appearing. No acute distress. HEENT: Pupils equally reactive to light and accomodation  Neck: Supple, no JVD Lungs: Normal effort of breathing; clear to auscultation bilaterally; no wheezes, rales, rhonchi Heart: Regular rate and rhythm. No murmur, rub, or gallop Abdomen: nondistended Extremities: no cyanosis, clubbing, or edema Peripheral Pulses: 2+ radial  Skin: Warm, dry, no diaphoresis  Assessment   69 y.o. female with  1. Coronary artery disease involving native coronary artery of native heart without angina pectoris  2. Type 2 diabetes mellitus without complication, without long-term current use of insulin (CMS-HCC)  3. Shortness of breath  4. Presence of stent in LAD coronary artery  5. Hyperlipidemia, unspecified hyperlipidemia type  6. Peripheral edema   69 year old female with a history of coronary artery disease, status post DES of LAD and OM 2, hypertension, type 2 diabetes, hyperlipidemia, COPD, and ongoing tobacco abuse, with a 9-month history of progressive weight gain, peripheral edema, and exertional shortness of breath. Lexiscan Myoview revealed normal LV function with no evidence of scar or ischemia. 2D echocardiogram revealed LVEF 50-55% with mild aortic stenosis, mild MR, TR, and PR with mild pulmonary hypertension. Peripheral edema has improved after increasing furosemide to 40 mg daily. The patient has essential hypertension with good blood pressure control on metoprolol and Lasix.  Plan   1. Continue current medication 2. Counseled  patient about low-sodium diet 3. DASH diet printed instructions given to the patient 4. Counseled patient about low-cholesterol diet 5. Continue atorvastatin for hyperlipidemia management  6. Low-fat and cholesterol diet printed instructions given to the patient 7. Strongly advised patient to lose weight 8. Strongly advised patient to stop smoking 9. Return to clinic for follow-up in 3 months  No orders of the  defined types were placed in this encounter.  Return in about 3 months (around 12/22/2018).  Michaela Cowman, MD PhD [facility]    Electronically signed by Michaela Cowman, MD at 09/21/2018 11:30 AM EDT   Plan of Treatment - documented as of this encounter

## 2019-08-07 NOTE — Pre-Procedure Instructions (Signed)
NM myocardial perfusion SPECT multiple (stress and rest)07/31/2018 [place] Result Impression   1. Normal left ventricular function 2. Normal wall motion 3. No evidence for scar or ischemia   2. 1.  Result Narrative  CARDIOLOGY DEPARTMENT [address], [place], [place] [address] [phone]  Procedure: Pharmacologic Myocardial Perfusion Imaging  ONE day procedure  Indication: Presence of stent in LAD coronary artery Plan: ECG stress test only  Coronary artery disease involving native coronary artery of native heart  without angina pectoris Plan: ECG stress test only  Shortness of breath Plan: ECG stress test only  Hyperlipidemia, unspecified hyperlipidemia type Plan: ECG stress test only  Type 2 diabetes mellitus without complication, without long-term current  use of insulin (CMS-HCC) Plan: ECG stress test only  Ordering Physician:   Dr. [name]   Clinical History: 69 y.o. year old female Vitals: Height: 64 in Weight: 210 lb Cardiac risk factors include:   Smoking, Hyperlipidemia, Diabetes, PCI, Obesity and CAD    Procedure:  Pharmacologic stress testing was performed with Regadenoson using a single  use 0.4mg /60ml (0.08 mg/ml) prefilled syringe intravenously infused as a  bolus dose. The stress test was stopped due to Infusion completion. Blood  pressure response was normal. The patient did not develop any symptoms  other than fatigue during the procedure.   Rest HR: 71bpm Rest BP: 138/20mmHg Max HR: 85bpm Min BP: 136/18mmHg  Stress Test Administered by: Oswald Hillock, CMA  ECG Interpretation: Rest ECG: normal sinus rhythm, right bundle branch block (RBBB) Stress ECG: normal sinus rhythm,  Recovery ECG: normal sinus rhythm ECG Interpretation: non-diagnostic due to pharmacologic testing.   Administrations This Visit   regadenoson (LEXISCAN) 0.4 mg/5 mL inj  syringe 0.4 mg   Admin Date 07/31/2018 Action Given Dose 0.4 mg Route Intravenous Administered By Herbert Seta, CNMT     technetium Tc26m sestamibi (CARDIOLITE) injection [id number] millicurie   Admin Date 07/31/2018 Action Given Dose [id number] millicurie Route Intravenous Administered By Herbert Seta, CNMT     technetium Tc62m sestamibi (CARDIOLITE) injection [id number] millicurie   Admin Date 07/31/2018 Action Given Dose [id number] millicurie Route Intravenous Administered By Herbert Seta, CNMT       Gated post-stress perfusion imaging was performed 30 minutes after stress.  Rest images were performed 30 minutes after injection.  Gated LV Analysis:  TID Ratio: 1.13  LVEF= 65%  FINDINGS: Regional wall motion: reveals normal myocardial thickening and wall  motion. The overall quality of the study is good.  Artifacts noted: no Left ventricular cavity: normal.  Perfusion Analysis: SPECT images demonstrate homogeneous tracer  distribution throughout the myocardium.   Status Results Details   Encounter Summary

## 2019-08-09 ENCOUNTER — Encounter
Admission: RE | Admit: 2019-08-09 | Discharge: 2019-08-09 | Disposition: A | Discharge status: Home / Self Care | Payer: Medicare HMO | Source: Ambulatory Visit | Attending: General Surgery | Admitting: General Surgery

## 2019-08-09 ENCOUNTER — Other Ambulatory Visit: Admission: RE | Admit: 2019-08-09 | Payer: Medicare HMO | Source: Ambulatory Visit

## 2019-08-09 ENCOUNTER — Other Ambulatory Visit: Payer: Self-pay

## 2019-08-09 DIAGNOSIS — I2510 Atherosclerotic heart disease of native coronary artery without angina pectoris: Secondary | ICD-10-CM | POA: Diagnosis not present

## 2019-08-09 DIAGNOSIS — I491 Atrial premature depolarization: Secondary | ICD-10-CM | POA: Diagnosis not present

## 2019-08-09 DIAGNOSIS — Z20822 Contact with and (suspected) exposure to covid-19: Secondary | ICD-10-CM | POA: Diagnosis not present

## 2019-08-09 DIAGNOSIS — Z01818 Encounter for other preprocedural examination: Secondary | ICD-10-CM | POA: Diagnosis not present

## 2019-08-09 DIAGNOSIS — E119 Type 2 diabetes mellitus without complications: Secondary | ICD-10-CM | POA: Insufficient documentation

## 2019-08-09 DIAGNOSIS — I4510 Unspecified right bundle-branch block: Secondary | ICD-10-CM | POA: Insufficient documentation

## 2019-08-09 DIAGNOSIS — I10 Essential (primary) hypertension: Secondary | ICD-10-CM | POA: Diagnosis not present

## 2019-08-09 LAB — SARS CORONAVIRUS 2 (TAT 6-24 HRS): SARS Coronavirus 2: NEGATIVE

## 2019-08-13 ENCOUNTER — Encounter
Admission: RE | Disposition: A | Discharge status: Home / Self Care | Payer: Self-pay | Source: Home / Self Care | Attending: General Surgery

## 2019-08-13 ENCOUNTER — Ambulatory Visit: Payer: Medicare HMO | Admitting: Anesthesiology

## 2019-08-13 ENCOUNTER — Other Ambulatory Visit: Payer: Self-pay

## 2019-08-13 ENCOUNTER — Encounter: Payer: Self-pay | Admitting: General Surgery

## 2019-08-13 ENCOUNTER — Ambulatory Visit
Admission: RE | Admit: 2019-08-13 | Discharge: 2019-08-13 | Disposition: A | Discharge status: Home / Self Care | Payer: Medicare HMO | Attending: General Surgery | Admitting: General Surgery

## 2019-08-13 DIAGNOSIS — Z853 Personal history of malignant neoplasm of breast: Secondary | ICD-10-CM | POA: Diagnosis not present

## 2019-08-13 DIAGNOSIS — Z79890 Hormone replacement therapy (postmenopausal): Secondary | ICD-10-CM | POA: Insufficient documentation

## 2019-08-13 DIAGNOSIS — C50511 Malignant neoplasm of lower-outer quadrant of right female breast: Secondary | ICD-10-CM | POA: Insufficient documentation

## 2019-08-13 DIAGNOSIS — I2510 Atherosclerotic heart disease of native coronary artery without angina pectoris: Secondary | ICD-10-CM | POA: Diagnosis not present

## 2019-08-13 DIAGNOSIS — Z955 Presence of coronary angioplasty implant and graft: Secondary | ICD-10-CM | POA: Insufficient documentation

## 2019-08-13 DIAGNOSIS — J439 Emphysema, unspecified: Secondary | ICD-10-CM | POA: Diagnosis not present

## 2019-08-13 DIAGNOSIS — K219 Gastro-esophageal reflux disease without esophagitis: Secondary | ICD-10-CM | POA: Insufficient documentation

## 2019-08-13 DIAGNOSIS — Z7982 Long term (current) use of aspirin: Secondary | ICD-10-CM | POA: Diagnosis not present

## 2019-08-13 DIAGNOSIS — Z7902 Long term (current) use of antithrombotics/antiplatelets: Secondary | ICD-10-CM | POA: Diagnosis not present

## 2019-08-13 DIAGNOSIS — M1990 Unspecified osteoarthritis, unspecified site: Secondary | ICD-10-CM | POA: Insufficient documentation

## 2019-08-13 DIAGNOSIS — Z7951 Long term (current) use of inhaled steroids: Secondary | ICD-10-CM | POA: Diagnosis not present

## 2019-08-13 DIAGNOSIS — C50911 Malignant neoplasm of unspecified site of right female breast: Secondary | ICD-10-CM | POA: Diagnosis present

## 2019-08-13 DIAGNOSIS — G629 Polyneuropathy, unspecified: Secondary | ICD-10-CM | POA: Insufficient documentation

## 2019-08-13 DIAGNOSIS — C773 Secondary and unspecified malignant neoplasm of axilla and upper limb lymph nodes: Secondary | ICD-10-CM | POA: Insufficient documentation

## 2019-08-13 DIAGNOSIS — T50905A Adverse effect of unspecified drugs, medicaments and biological substances, initial encounter: Secondary | ICD-10-CM | POA: Insufficient documentation

## 2019-08-13 DIAGNOSIS — Z7984 Long term (current) use of oral hypoglycemic drugs: Secondary | ICD-10-CM | POA: Insufficient documentation

## 2019-08-13 DIAGNOSIS — Z79811 Long term (current) use of aromatase inhibitors: Secondary | ICD-10-CM | POA: Insufficient documentation

## 2019-08-13 DIAGNOSIS — Z9012 Acquired absence of left breast and nipple: Secondary | ICD-10-CM | POA: Insufficient documentation

## 2019-08-13 DIAGNOSIS — E039 Hypothyroidism, unspecified: Secondary | ICD-10-CM | POA: Insufficient documentation

## 2019-08-13 DIAGNOSIS — I10 Essential (primary) hypertension: Secondary | ICD-10-CM | POA: Diagnosis not present

## 2019-08-13 DIAGNOSIS — F17210 Nicotine dependence, cigarettes, uncomplicated: Secondary | ICD-10-CM | POA: Insufficient documentation

## 2019-08-13 DIAGNOSIS — G4733 Obstructive sleep apnea (adult) (pediatric): Secondary | ICD-10-CM | POA: Insufficient documentation

## 2019-08-13 DIAGNOSIS — Z79899 Other long term (current) drug therapy: Secondary | ICD-10-CM | POA: Insufficient documentation

## 2019-08-13 DIAGNOSIS — E119 Type 2 diabetes mellitus without complications: Secondary | ICD-10-CM | POA: Insufficient documentation

## 2019-08-13 DIAGNOSIS — E1151 Type 2 diabetes mellitus with diabetic peripheral angiopathy without gangrene: Secondary | ICD-10-CM | POA: Diagnosis not present

## 2019-08-13 HISTORY — PX: MASTECTOMY MODIFIED RADICAL: SHX5962

## 2019-08-13 LAB — GLUCOSE, CAPILLARY
Glucose-Capillary: 127 mg/dL — ABNORMAL HIGH (ref 70–99)
Glucose-Capillary: 132 mg/dL — ABNORMAL HIGH (ref 70–99)

## 2019-08-13 SURGERY — MASTECTOMY, MODIFIED RADICAL
Anesthesia: General | Laterality: Right

## 2019-08-13 MED ORDER — EPHEDRINE 5 MG/ML INJ
INTRAVENOUS | Status: AC
  Filled 2019-08-13: qty 10

## 2019-08-13 MED ORDER — IPRATROPIUM-ALBUTEROL 0.5-2.5 (3) MG/3ML IN SOLN: 3.0000 mL | RESPIRATORY_TRACT | Status: AC

## 2019-08-13 MED ORDER — PROPOFOL 10 MG/ML IV BOLUS
INTRAVENOUS | Status: DC | PRN
  Administered 2019-08-13: 180 mg via INTRAVENOUS

## 2019-08-13 MED ORDER — IPRATROPIUM-ALBUTEROL 0.5-2.5 (3) MG/3ML IN SOLN
RESPIRATORY_TRACT | Status: AC
  Administered 2019-08-13: 3 mL via RESPIRATORY_TRACT
  Filled 2019-08-13: qty 3

## 2019-08-13 MED ORDER — KETOROLAC TROMETHAMINE 30 MG/ML IJ SOLN
INTRAMUSCULAR | Status: AC
  Filled 2019-08-13: qty 1

## 2019-08-13 MED ORDER — CEFAZOLIN SODIUM-DEXTROSE 2-4 GM/100ML-% IV SOLN
INTRAVENOUS | Status: AC
  Filled 2019-08-13: qty 100

## 2019-08-13 MED ORDER — CEFAZOLIN SODIUM-DEXTROSE 2-4 GM/100ML-% IV SOLN
2.0000 g | INTRAVENOUS | Status: AC
  Administered 2019-08-13: 09:00:00 2 g via INTRAVENOUS

## 2019-08-13 MED ORDER — METHYLENE BLUE 0.5 % INJ SOLN
INTRAVENOUS | Status: AC
  Filled 2019-08-13: qty 10

## 2019-08-13 MED ORDER — FENTANYL CITRATE (PF) 100 MCG/2ML IJ SOLN: 25.0000 ug | INTRAMUSCULAR | Status: DC | PRN

## 2019-08-13 MED ORDER — FENTANYL CITRATE (PF) 100 MCG/2ML IJ SOLN
INTRAMUSCULAR | Status: DC | PRN
  Administered 2019-08-13: 25 ug via INTRAVENOUS
  Administered 2019-08-13: 50 ug via INTRAVENOUS
  Administered 2019-08-13: 25 ug via INTRAVENOUS

## 2019-08-13 MED ORDER — ACETAMINOPHEN 10 MG/ML IV SOLN
INTRAVENOUS | Status: AC
  Filled 2019-08-13: qty 100

## 2019-08-13 MED ORDER — MIDAZOLAM HCL 2 MG/2ML IJ SOLN
INTRAMUSCULAR | Status: DC | PRN
  Administered 2019-08-13: 1.5 mg via INTRAVENOUS

## 2019-08-13 MED ORDER — ROCURONIUM BROMIDE 100 MG/10ML IV SOLN
INTRAVENOUS | Status: DC | PRN
  Administered 2019-08-13: 45 mg via INTRAVENOUS

## 2019-08-13 MED ORDER — EPHEDRINE SULFATE 50 MG/ML IJ SOLN
INTRAMUSCULAR | Status: DC | PRN
  Administered 2019-08-13: 5 mg via INTRAVENOUS

## 2019-08-13 MED ORDER — SUGAMMADEX SODIUM 200 MG/2ML IV SOLN
INTRAVENOUS | Status: DC | PRN
  Administered 2019-08-13: 200 mg via INTRAVENOUS

## 2019-08-13 MED ORDER — LIDOCAINE HCL (CARDIAC) PF 100 MG/5ML IV SOSY
PREFILLED_SYRINGE | INTRAVENOUS | Status: DC | PRN
  Administered 2019-08-13: 100 mg via INTRAVENOUS

## 2019-08-13 MED ORDER — LACTATED RINGERS IV SOLN: INTRAVENOUS | Status: DC | PRN

## 2019-08-13 MED ORDER — ACETAMINOPHEN 10 MG/ML IV SOLN
INTRAVENOUS | Status: DC | PRN
  Administered 2019-08-13: 1000 mg via INTRAVENOUS

## 2019-08-13 MED ORDER — ONDANSETRON HCL 4 MG/2ML IJ SOLN
INTRAMUSCULAR | Status: DC | PRN
  Administered 2019-08-13: 4 mg via INTRAVENOUS

## 2019-08-13 MED ORDER — ONDANSETRON HCL 4 MG/2ML IJ SOLN: 4.0000 mg | Freq: Once | INTRAMUSCULAR | Status: DC | PRN

## 2019-08-13 MED ORDER — PROPOFOL 10 MG/ML IV BOLUS
INTRAVENOUS | Status: AC
  Filled 2019-08-13: qty 20

## 2019-08-13 MED ORDER — KETOROLAC TROMETHAMINE 30 MG/ML IJ SOLN
INTRAMUSCULAR | Status: DC | PRN
  Administered 2019-08-13: 30 mg via INTRAVENOUS

## 2019-08-13 MED ORDER — FENTANYL CITRATE (PF) 100 MCG/2ML IJ SOLN
INTRAMUSCULAR | Status: AC
  Filled 2019-08-13: qty 2

## 2019-08-13 MED ORDER — MIDAZOLAM HCL 2 MG/2ML IJ SOLN
INTRAMUSCULAR | Status: AC
  Filled 2019-08-13: qty 2

## 2019-08-13 MED ORDER — LIDOCAINE HCL (PF) 2 % IJ SOLN
INTRAMUSCULAR | Status: AC
  Filled 2019-08-13: qty 5

## 2019-08-13 MED ORDER — SODIUM CHLORIDE 0.9 % IV SOLN: INTRAVENOUS | Status: DC

## 2019-08-13 MED ORDER — HYDROCODONE-ACETAMINOPHEN 5-325 MG PO TABS
1.0000 | ORAL_TABLET | ORAL | 0 refills | Status: DC | PRN
Start: 2019-08-13 — End: 2019-09-24

## 2019-08-13 SURGICAL SUPPLY — 58 items
APPLIER CLIP 11 MED OPEN (CLIP) ×3
APPLIER CLIP 13 LRG OPEN (CLIP)
BINDER BREAST LRG (GAUZE/BANDAGES/DRESSINGS) ×2 IMPLANT
BINDER BREAST MEDIUM (GAUZE/BANDAGES/DRESSINGS) IMPLANT
BINDER BREAST XLRG (GAUZE/BANDAGES/DRESSINGS) IMPLANT
BINDER BREAST XXLRG (GAUZE/BANDAGES/DRESSINGS) IMPLANT
BLADE PHOTON ILLUMINATED (MISCELLANEOUS) ×2 IMPLANT
BLADE SURG 15 STRL SS SAFETY (BLADE) ×3 IMPLANT
BULB RESERV EVAC DRAIN JP 100C (MISCELLANEOUS) ×1 IMPLANT
CANISTER SUCT 1200ML W/VALVE (MISCELLANEOUS) ×3 IMPLANT
CHLORAPREP W/TINT 26 (MISCELLANEOUS) ×3 IMPLANT
CLIP APPLIE 11 MED OPEN (CLIP) IMPLANT
CLIP APPLIE 13 LRG OPEN (CLIP) IMPLANT
CLOSURE WOUND 1/2 X4 (GAUZE/BANDAGES/DRESSINGS) ×2
CNTNR SPEC 2.5X3XGRAD LEK (MISCELLANEOUS)
CONT SPEC 4OZ STER OR WHT (MISCELLANEOUS)
CONT SPEC 4OZ STRL OR WHT (MISCELLANEOUS)
CONTAINER SPEC 2.5X3XGRAD LEK (MISCELLANEOUS) ×3 IMPLANT
COVER PROBE FLX POLY STRL (MISCELLANEOUS) ×1 IMPLANT
COVER WAND RF STERILE (DRAPES) ×3 IMPLANT
DEVICE DUBIN SPECIMEN MAMMOGRA (MISCELLANEOUS) IMPLANT
DRAIN CHANNEL JP 15F RND 16 (MISCELLANEOUS) ×1 IMPLANT
DRAPE LAPAROTOMY TRNSV 106X77 (MISCELLANEOUS) ×3 IMPLANT
DRSG EMULSION OIL 3X8 NADH (GAUZE/BANDAGES/DRESSINGS) ×2 IMPLANT
DRSG GAUZE FLUFF 36X18 (GAUZE/BANDAGES/DRESSINGS) ×5 IMPLANT
DRSG TELFA 3X8 NADH (GAUZE/BANDAGES/DRESSINGS) ×3 IMPLANT
ELECT CAUTERY BLADE TIP 2.5 (TIP) ×3
ELECT REM PT RETURN 9FT ADLT (ELECTROSURGICAL) ×3
ELECTRODE CAUTERY BLDE TIP 2.5 (TIP) ×1 IMPLANT
ELECTRODE REM PT RTRN 9FT ADLT (ELECTROSURGICAL) ×1 IMPLANT
GAUZE SPONGE 4X4 12PLY STRL (GAUZE/BANDAGES/DRESSINGS) ×1 IMPLANT
GLOVE BIO SURGEON STRL SZ7.5 (GLOVE) ×7 IMPLANT
GLOVE INDICATOR 8.0 STRL GRN (GLOVE) ×7 IMPLANT
GOWN STRL REUS W/ TWL LRG LVL3 (GOWN DISPOSABLE) ×2 IMPLANT
GOWN STRL REUS W/TWL LRG LVL3 (GOWN DISPOSABLE) ×9
KIT TURNOVER KIT A (KITS) ×3 IMPLANT
LABEL OR SOLS (LABEL) ×3 IMPLANT
PACK BASIN MINOR (MISCELLANEOUS) ×3 IMPLANT
PAD DRESSING TELFA 3X8 NADH (GAUZE/BANDAGES/DRESSINGS) ×1 IMPLANT
PIN SAFETY STRL (MISCELLANEOUS) ×1 IMPLANT
RETRACTOR RING XSMALL (MISCELLANEOUS) IMPLANT
RTRCTR WOUND ALEXIS 13CM XS SH (MISCELLANEOUS)
SHEARS FOC LG CVD HARMONIC 17C (MISCELLANEOUS) ×2 IMPLANT
SLEVE PROBE SENORX GAMMA FIND (MISCELLANEOUS) ×1 IMPLANT
SPONGE LAP 18X18 RF (DISPOSABLE) ×3 IMPLANT
STRIP CLOSURE SKIN 1/2X4 (GAUZE/BANDAGES/DRESSINGS) ×4 IMPLANT
SUT ETHILON 3-0 FS-10 30 BLK (SUTURE) ×3
SUT SILK 2 0 (SUTURE) ×3
SUT SILK 2-0 30XBRD TIE 12 (SUTURE) ×1 IMPLANT
SUT VIC AB 2-0 CT1 27 (SUTURE) ×9
SUT VIC AB 2-0 CT1 TAPERPNT 27 (SUTURE) ×3 IMPLANT
SUT VIC AB 3-0 SH 27 (SUTURE) ×3
SUT VIC AB 3-0 SH 27X BRD (SUTURE) ×1 IMPLANT
SUT VICRYL+ 3-0 144IN (SUTURE) ×3 IMPLANT
SUTURE EHLN 3-0 FS-10 30 BLK (SUTURE) ×1 IMPLANT
SWABSTK COMLB BENZOIN TINCTURE (MISCELLANEOUS) ×3 IMPLANT
SYR BULB IRRIG 60ML STRL (SYRINGE) ×2 IMPLANT
TAPE TRANSPORE STRL 2 31045 (GAUZE/BANDAGES/DRESSINGS) ×3 IMPLANT

## 2019-08-13 NOTE — Op Note (Signed)
Preoperative diagnosis: Right breast cancer with positive axillary node, desire for mastectomy.  Postoperative diagnosis: Same.  Operative procedure: Right modified radical mastectomy.  Operating surgeon: Hervey Ard, MD.  Anesthesia: General endotracheal.  Estimated blood loss: 30 cc.  Clinical note: This 69 year old woman has developed a stage II right breast cancer.  She is undergone a previous mastectomy in the distant past.  She desired to proceed to mastectomy.  Operative note: With the patient under adequate general endotracheal anesthesia the breast chest and neck was cleansed with ChloraPrep and draped.  An elliptical incision was outlined on the breast.  The skin was incised sharply and the remaining dissection completed with electrocautery.  Hemostasis was with 3-0 Vicryl ties, 2-0 silk ties and the harmonic scalpel.  The margins of resection were the clavicle superiorly, sternum medially, rectus fascia inferiorly and this the serratus fascia laterally.  Inferiorly at the 6-7 o'clock position the tumor approached the undersurface of the skin.  It appeared that the margin was clear but the skin was tagged on the undersurface with 3-0 nylon sutures and a single medium clip.  The tumor was adherent to the pectoralis fashion this area and a 4 cm area of the pectoralis muscle was resected with the specimen.  The margins of resection of the pectoralis muscle were clipped for identification should postoperative radiation be needed.  The axillary envelope was opened and the axillary vein identified.  The axillary contents were swept inferiorly and the thoracodorsal nerve artery and vein bundle and the long thoracic nerve of Bell were identified and protected.  Both nerves were functional at the end of the procedure.  Hemostasis was with harmonic scalpel.  The specimen was personally delivered to the pathology department for orientation of the areas of concern on margins after the  procedure.  The wound was irrigated with warm water.  A 15-gauge Blake drain was brought out through the inferior medial flap.  This was anchored into position with a 3-0 nylon stitch.  The deep dermal layer was approximated with a running 2-0 Vicryl suture in 2 segments.  Benzoin and Steri-Strips followed by Telfa, fluff gauze and a compressive wrap were applied.  The patient tolerated the procedure well and was taken to recovery in stable condition.

## 2019-08-13 NOTE — Discharge Instructions (Signed)

## 2019-08-13 NOTE — Transfer of Care (Signed)
Immediate Anesthesia Transfer of Care Note  Patient: Michaela Wilcox  Procedure(s) Performed: Modified Radical Mastectomy (Right )  Patient Location: PACU  Anesthesia Type:General  Level of Consciousness: awake, alert  and oriented  Airway & Oxygen Therapy: Patient Spontanous Breathing and Patient connected to face mask oxygen  Post-op Assessment: Report given to RN and Post -op Vital signs reviewed and stable  Post vital signs: Reviewed and stable  Last Vitals:  Vitals Value Taken Time  BP    Temp    Pulse 77 08/13/19 1058  Resp 22 08/13/19 1058  SpO2 91 % 08/13/19 1058  Vitals shown include unvalidated device data.  Last Pain:  Vitals:   08/13/19 1052  TempSrc:   PainSc: 0-No pain      Patients Stated Pain Goal: 0 ([id number])  Complications: No apparent anesthesia complications

## 2019-08-13 NOTE — Anesthesia Postprocedure Evaluation (Signed)
Anesthesia Post Note  Patient: Michaela Wilcox  Procedure(s) Performed: Modified Radical Mastectomy (Right )  Patient location during evaluation: PACU Anesthesia Type: General Level of consciousness: awake and alert Pain management: pain level controlled Vital Signs Assessment: post-procedure vital signs reviewed and stable Respiratory status: spontaneous breathing and respiratory function stable Cardiovascular status: stable Anesthetic complications: no     Last Vitals:  Vitals:   08/13/19 1107 08/13/19 1110  BP:  137/65  Pulse:  75  Resp:  18  Temp:    SpO2: 93% 95%    Last Pain:  Vitals:   08/13/19 1052  TempSrc:   PainSc: 0-No pain                 Evea Sheek K

## 2019-08-13 NOTE — Anesthesia Preprocedure Evaluation (Signed)
Anesthesia Evaluation  Patient identified by MRN, date of birth, ID band Patient awake    Reviewed: Allergy & Precautions, NPO status , Patient's Chart, lab work & pertinent test results  History of Anesthesia Complications Negative for: history of anesthetic complications  Airway Mallampati: II       Dental   Pulmonary sleep apnea and Continuous Positive Airway Pressure Ventilation , COPD,  COPD inhaler, Current Smoker,           Cardiovascular hypertension, + CAD and + Cardiac Stents  (-) CHF (-) dysrhythmias (-) Valvular Problems/Murmurs     Neuro/Psych neg Seizures    GI/Hepatic Neg liver ROS, PUD, GERD  Medicated,  Endo/Other  diabetes, Type 2, Oral Hypoglycemic AgentsHypothyroidism   Renal/GU negative Renal ROS     Musculoskeletal   Abdominal   Peds  Hematology   Anesthesia Other Findings   Reproductive/Obstetrics                             Anesthesia Physical Anesthesia Plan  ASA: III  Anesthesia Plan: General   Post-op Pain Management:    Induction: Intravenous  PONV Risk Score and Plan: 2 and Ondansetron and Dexamethasone  Airway Management Planned: Oral ETT  Additional Equipment:   Intra-op Plan:   Post-operative Plan:   Informed Consent: I have reviewed the patients History and Physical, chart, labs and discussed the procedure including the risks, benefits and alternatives for the proposed anesthesia with the patient or authorized representative who has indicated his/her understanding and acceptance.       Plan Discussed with:   Anesthesia Plan Comments:         Anesthesia Quick Evaluation

## 2019-08-13 NOTE — H&P (Signed)
Michaela Wilcox AV:[id number] Jul 17, 1950     HPI:   69 year old with right breast cancer for right modified radical mastectomy.   Medications Prior to Admission  Medication Sig Dispense Refill Last Dose  . ADVAIR DISKUS 250-50 MCG/DOSE AEPB Inhale 1 puff into the lungs in the morning and at bedtime.   08/13/2019 at Unknown time  . albuterol (ACCUNEB) 1.25 MG/3ML nebulizer solution Take 1 ampule by nebulization in the morning and at bedtime.    08/13/2019 at Unknown time  . aspirin 81 MG EC tablet Take 81 mg by mouth daily. Swallow whole.   08/12/2019 at Unknown time  . atorvastatin (LIPITOR) 80 MG tablet Take 80 mg by mouth at bedtime.    08/12/2019 at Unknown time  . buPROPion (WELLBUTRIN XL) 150 MG 24 hr tablet Take 150 mg by mouth every morning.    08/13/2019 at Unknown time  . clopidogrel (PLAVIX) 75 MG tablet Take 75 mg by mouth daily.    08/05/2019  . furosemide (LASIX) 20 MG tablet Take 20 mg by mouth 2 (two) times daily.    08/13/2019 at Unknown time  . gabapentin (NEURONTIN) 300 MG capsule Take 300 mg by mouth in the morning and at bedtime.   08/13/2019 at Unknown time  . glipiZIDE (GLUCOTROL) 5 MG tablet Take 5 mg by mouth daily before breakfast.    08/12/2019 at Unknown time  . INCRUSE ELLIPTA 62.5 MCG/INH AEPB Inhale 1 puff into the lungs every morning.    08/13/2019 at Unknown time  . isosorbide mononitrate (IMDUR) 60 MG 24 hr tablet Take 60 mg by mouth every morning.    08/13/2019 at Unknown time  . letrozole (FEMARA) 2.5 MG tablet Take 1 tablet (2.5 mg total) by mouth daily. Once a day. (Patient taking differently: Take 2.5 mg by mouth every morning. Once a day.) 90 tablet 3 08/13/2019 at Unknown time  . levothyroxine (SYNTHROID, LEVOTHROID) 150 MCG tablet Take 150 mcg by mouth daily before breakfast.    08/13/2019 at Unknown time  . omeprazole (PRILOSEC OTC) 20 MG tablet Take 20 mg by mouth every morning.   08/13/2019 at Unknown time  . pioglitazone (ACTOS) 45 MG tablet Take 45 mg by mouth daily.    08/12/2019 at  Unknown time  . pramipexole (MIRAPEX) 0.25 MG tablet Take 0.25 mg by mouth at bedtime.    08/12/2019 at Unknown time   No Known Allergies Past Medical History:  Diagnosis Date  . Arthritis   . Breast cancer ([place]) 2005   left  . Cancer ([place]) 7/05   breast; mastectomy, chemo and radiation  . Carotid artery disease ([place])   . Coronary artery disease   . Diabetes mellitus   . Emphysema lung ([place])   . GERD (gastroesophageal reflux disease)   . High cholesterol    on statin  . Hypertension   . 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 OSA on CPAP 04/02/2011  . Peripheral vascular occlusive disease ([place])    occluded left internal carotid and moderate right ICAstenosis  . Thoracic compression fracture [facility])    Past Surgical History:  Procedure Laterality Date  . APPENDECTOMY    . BREAST SURGERY    . CARDIAC CATHETERIZATION  2010   recath revealing non-critica CAD  . CARDIAC SURGERY     X3  . carotid doppler  10/20/09   right bulb and ICA 50-69%,left CCA occluded  . CHOLECYSTECTOMY    . CORONARY ANGIOPLASTY  2009   s/p  LAD AND CIRC STENT  . LAPAROTOMY  04/07/2011   BOWEL OBSTRUCTIONProcedure: EXPLORATORY LAPAROTOMY;  Surgeon: Donato Heinz, MD;  Location: [facility];  Service: General;  Laterality: N/A;  . MASTECTOMY Left 2005  . NM MYOCAR PERF WALL MOTION  July 02, 2010   entirely normal  . THYROID SURGERY    . TUBAL LIGATION     Social History   Socioeconomic History  . Marital status: Divorced    Spouse name: Not on file  . Number of children: Not on file  . Years of education: Not on file  . Highest education level: Not on file  Occupational History  . Not on file  Tobacco Use  . Smoking status: Current Every Day Smoker    Packs/day: 1.00    Years: 34.00    Pack years: 34.00    Types: Cigarettes  . Smokeless tobacco: Never Used  . Tobacco comment: currently smoking less than a pack per day  Substance and Sexual Activity  . Alcohol use: No  . Drug use: No   . Sexual activity: Not on file  Other Topics Concern  . 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 Rolling walker- since starting 2021 [back pain]    Social Determinants of Health   Financial Resource Strain:   . Difficulty of Paying Living Expenses:   Food Insecurity:   . Worried About Charity fundraiser in the Last Year:   . Arboriculturist in the Last Year:   Transportation Needs:   . Film/video editor (Medical):   Marland Kitchen Lack of Transportation (Non-Medical):   Physical Activity:   . Days of Exercise per Week:   . Minutes of Exercise per Session:   Stress:   . Feeling of Stress :   Social Connections:   . Frequency of Communication with Friends and Family:   . Frequency of Social Gatherings with Friends and Family:   . Attends Religious Services:   . Active Member of Clubs or Organizations:   . Attends Archivist Meetings:   Marland Kitchen Marital Status:   Intimate Partner Violence:   . Fear of Current or Ex-Partner:   . Emotionally Abused:   Marland Kitchen Physically Abused:   . 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 Rolling walker- since starting 2021 [back pain]      ROS: Negative.     PE: HEENT: Negative. Lungs: Clear. Cardio: RR.  Assessment/Plan:  Proceed with planned right mastectomy.   [facility] 08/13/2019

## 2019-08-13 NOTE — OR Nursing (Signed)
Per Dr. Fleet Contras via tele 1224 pm; may discharge patient to home without his visit to postop.

## 2019-08-13 NOTE — Anesthesia Procedure Notes (Signed)
Procedure Name: General with mask airway Date/Time: 08/13/2019 8:35 AM Performed by: Allean Found, CRNA Pre-anesthesia Checklist: Patient identified, Patient being monitored, Timeout performed, Emergency Drugs available and Suction available Patient Re-evaluated:Patient Re-evaluated prior to induction Oxygen Delivery Method: Circle system utilized Preoxygenation: Pre-oxygenation with 100% oxygen Induction Type: IV induction Ventilation: Mask ventilation without difficulty Laryngoscope Size: 3 and McGraph Grade View: Grade I Tube type: Oral Tube size: 7.0 mm Number of attempts: 1 Airway Equipment and Method: Stylet Placement Confirmation: ETT inserted through vocal cords under direct vision,  positive ETCO2 and breath sounds checked- equal and bilateral Secured at: 21 cm Tube secured with: Tape Dental Injury: Teeth and Oropharynx as per pre-operative assessment

## 2019-08-16 LAB — SURGICAL PATHOLOGY

## 2019-08-27 ENCOUNTER — Inpatient Hospital Stay: Payer: Medicare HMO | Attending: Internal Medicine | Admitting: Internal Medicine

## 2019-08-27 ENCOUNTER — Encounter: Payer: Self-pay | Admitting: Internal Medicine

## 2019-08-27 ENCOUNTER — Other Ambulatory Visit: Payer: Self-pay

## 2019-08-27 DIAGNOSIS — C50511 Malignant neoplasm of lower-outer quadrant of right female breast: Secondary | ICD-10-CM | POA: Insufficient documentation

## 2019-08-27 DIAGNOSIS — F17210 Nicotine dependence, cigarettes, uncomplicated: Secondary | ICD-10-CM | POA: Insufficient documentation

## 2019-08-27 DIAGNOSIS — Z170 Estrogen receptor positive status [ER+]: Secondary | ICD-10-CM | POA: Diagnosis not present

## 2019-08-27 DIAGNOSIS — G620 Drug-induced polyneuropathy: Secondary | ICD-10-CM | POA: Insufficient documentation

## 2019-08-27 NOTE — Assessment & Plan Note (Addendum)
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 pT2 N1a.  S/p mastectomy/axial lymph node dissection.   #Discussed with regards to systemic therapy; await Oncotype.  In general patient is not a very good candidate for chemotherapy; also patient is reluctant with chemo.  Discussed regarding radiation evaluation.  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  Stable  # DISPOSITION: # referral to Dr.Crystal # follow up TBD- Dr.B

## 2019-08-27 NOTE — Progress Notes (Signed)
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  female right breast cancer ER/PR positive HER-2 negative is here for follow-up/post surgery.  Patient underwent mastectomy with a preference; axial lymph node dissection.  She continues have the drain in place.  She is draining about 50 to 60 cc serous discharge.  Review of Systems  Constitutional: Negative for chills, diaphoresis, fever, malaise/fatigue and weight loss.  HENT: Negative for nosebleeds and sore throat.   Eyes: Negative for double vision.  Respiratory: Positive for shortness of breath. Negative for cough, hemoptysis, sputum production and wheezing.   Cardiovascular: Negative for chest pain, palpitations, orthopnea and leg swelling.  Gastrointestinal: Negative for abdominal pain, blood in stool, constipation, diarrhea, heartburn, melena, nausea and vomiting.  Genitourinary: Negative for dysuria, frequency and urgency.  Musculoskeletal: Positive for back pain and joint pain.  Skin: Negative.  Negative for itching and rash.  Neurological: Positive for tingling.  Negative for dizziness, focal weakness, weakness and headaches.  Endo/Heme/Allergies: Does not bruise/bleed easily.  Psychiatric/Behavioral: Negative for depression. The patient is not nervous/anxious and does not have insomnia.      MEDICAL HISTORY:  Past Medical History:  Diagnosis Date  . Arthritis   . Breast cancer ([place]) 2005   left  . Cancer ([place]) 7/05   breast; mastectomy, chemo and radiation  . Carotid artery disease ([place])   . Coronary artery disease   . Diabetes mellitus   . Emphysema lung ([place])   . GERD (gastroesophageal reflux disease)   . High cholesterol    on statin  . Hypertension   . 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 OSA on CPAP 04/02/2011  . Peripheral vascular occlusive disease ([place])    occluded left internal carotid and moderate right ICAstenosis  . Thoracic compression fracture [facility])     SURGICAL HISTORY: Past Surgical History:  Procedure Laterality Date  . APPENDECTOMY    . BREAST SURGERY    . CARDIAC CATHETERIZATION  2010   recath revealing non-critica CAD  . CARDIAC SURGERY     X3  . carotid doppler  10/20/09   right bulb and ICA 50-69%,left CCA occluded  . CHOLECYSTECTOMY    . CORONARY ANGIOPLASTY  2009   s/p LAD AND CIRC STENT  . LAPAROTOMY  04/07/2011   BOWEL OBSTRUCTIONProcedure: EXPLORATORY LAPAROTOMY;  Surgeon: Donato Heinz, MD;  Location: [facility];  Service: General;  Laterality: N/A;  . MASTECTOMY Left 2005  . MASTECTOMY MODIFIED RADICAL Right 08/13/2019   Procedure: Modified Radical Mastectomy;  Surgeon: Robert Bellow, MD;  Location: [facility] ORS;  Service: General;  Laterality: Right;  right breast modified radical mastectomy  . NM MYOCAR PERF WALL MOTION  July 02, 2010   entirely normal  . THYROID SURGERY    . TUBAL LIGATION      SOCIAL HISTORY: Social History   Socioeconomic History  . Marital status: Divorced    Spouse name: Not on file  . Number of children: Not on file  . Years of education: Not on file  .  Highest education level: Not on file  Occupational History  . Not on file  Tobacco Use  . Smoking status: Current Every Day Smoker    Packs/day: 1.00    Years: 34.00    Pack years: 34.00    Types: Cigarettes  . Smokeless tobacco: Never Used  . Tobacco comment: currently smoking less than a pack per day  Substance and Sexual Activity  . Alcohol use: No  . Drug use: No  . Sexual activity: Not on file  Other Topics Concern  . 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 Rolling walker- since starting 2021 [back pain]    Social Determinants of Health   Financial Resource Strain:   . Difficulty of Paying Living Expenses:   Food Insecurity:   . Worried About Charity fundraiser in the Last Year:   . Arboriculturist in the Last Year:   Transportation Needs:   . Film/video editor (Medical):   Marland Kitchen Lack of Transportation (Non-Medical):   Physical Activity:   . Days of Exercise per Week:   . Minutes of Exercise per Session:   Stress:   . Feeling of Stress :   Social Connections:   . Frequency of Communication with Friends and Family:   . Frequency of Social Gatherings with Friends and Family:   . Attends Religious Services:   . Active Member of Clubs or Organizations:   . Attends Archivist Meetings:   Marland Kitchen Marital Status:   Intimate Partner Violence:   . Fear of Current or Ex-Partner:   . Emotionally Abused:   Marland Kitchen Physically Abused:   . Sexually Abused:     FAMILY HISTORY: Family History  Problem Relation Age of Onset  . Crohn's disease Sister   . Breast cancer Neg Hx     ALLERGIES:  has No Known Allergies.  MEDICATIONS:  Current Outpatient Medications  Medication Sig Dispense Refill  . ADVAIR DISKUS 250-50 MCG/DOSE AEPB Inhale 1 puff into the lungs in the morning and at bedtime.    Marland Kitchen albuterol (ACCUNEB) 1.25 MG/3ML nebulizer solution Take 1 ampule by nebulization in the  morning and at bedtime.     Marland Kitchen aspirin 81 MG EC tablet Take 81 mg by mouth daily. Swallow whole.    Marland Kitchen atorvastatin (LIPITOR) 80 MG tablet Take 80 mg by mouth at bedtime.     Marland Kitchen buPROPion (WELLBUTRIN XL) 150 MG 24 hr tablet Take 150 mg by mouth every morning.     . clopidogrel (PLAVIX) 75 MG tablet Take 75 mg by mouth daily.     . furosemide (LASIX) 20 MG tablet Take 20 mg by mouth 2 (two) times daily.     Marland Kitchen gabapentin (NEURONTIN) 300 MG capsule Take 300 mg by mouth in the morning and at bedtime.    Marland Kitchen glipiZIDE (GLUCOTROL) 5 MG tablet Take 5 mg by mouth daily before breakfast.     . HYDROcodone-acetaminophen (NORCO/VICODIN) 5-325 MG tablet Take 1 tablet by mouth every 4 (four) hours as needed for moderate pain. 15  tablet 0  . INCRUSE ELLIPTA 62.5 MCG/INH AEPB Inhale 1 puff into the lungs every morning.     . isosorbide mononitrate (IMDUR) 60 MG 24 hr tablet Take 60 mg by mouth every morning.     Marland Kitchen letrozole (FEMARA) 2.5 MG tablet Take 1 tablet (2.5 mg total) by mouth daily. Once a day. (Patient taking differently: Take 2.5 mg by mouth every morning. Once a day.) 90 tablet 3  . levothyroxine (SYNTHROID, LEVOTHROID) 150 MCG tablet Take 150 mcg by mouth daily before breakfast.     . omeprazole (PRILOSEC OTC) 20 MG tablet Take 20 mg by mouth every morning.    . pioglitazone (ACTOS) 45 MG tablet Take 45 mg by mouth daily.     . pramipexole (MIRAPEX) 0.25 MG tablet Take 0.25 mg by mouth at bedtime.      No current facility-administered medications for this visit.      Marland Kitchen  PHYSICAL EXAMINATION: ECOG PERFORMANCE STATUS: 0 - Asymptomatic  Vitals:   08/27/19 1015  BP: 132/62  Pulse: (!) 56  Resp: 18  Temp: 98.1 F (36.7 C)  SpO2: 100%   Filed Weights   08/27/19 1020  Weight: 203 lb (92.1 kg)    Physical Exam  Constitutional: She is oriented to person, place, and time and well-developed, well-nourished, and in no distress.  Patient walking herself.   HENT:  Head: Normocephalic and  atraumatic.  Mouth/Throat: Oropharynx is clear and moist. No oropharyngeal exudate.  Eyes: Pupils are equal, round, and reactive to light.  Cardiovascular: Normal rate and regular rhythm.  Pulmonary/Chest: Effort normal and breath sounds normal. No respiratory distress. She has no wheezes.  Abdominal: Soft. Bowel sounds are normal. She exhibits no distension and no mass. There is no abdominal tenderness. There is no rebound and no guarding.  Musculoskeletal:        General: No tenderness or edema. Normal range of motion.     Cervical back: Normal range of motion and neck supple.  Neurological: She is alert and oriented to person, place, and time.  Skin: Skin is warm.  Psychiatric: Affect normal.     LABORATORY DATA:  I have reviewed the data as listed Lab Results  Component Value Date   WBC 6.4 08/01/2019   HGB 13.6 08/01/2019   HCT 40.9 08/01/2019   MCV 97.6 08/01/2019   PLT 230 08/01/2019   Recent Labs    08/01/19 1016  NA 140  K 3.9  CL 100  CO2 28  GLUCOSE 204*  BUN 17  CREATININE 0.69  CALCIUM 9.0  GFRNONAA >60  GFRAA >60  PROT 7.3  ALBUMIN 3.9  AST 22  ALT 15  ALKPHOS 67  BILITOT 0.9    RADIOGRAPHIC STUDIES: I have personally reviewed the radiological images as listed and agreed with the findings in the report. No results found.  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 pT2 N1a.  S/p mastectomy/axial lymph node dissection.   #Discussed with regards to systemic therapy; await Oncotype.  In general patient is not a very good candidate for chemotherapy; also patient is reluctant with chemo.  Discussed regarding radiation evaluation.  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  Stable  # DISPOSITION: # referral to Dr.Crystal # follow up TBD- Dr.B  All questions were answered. The  patient/family knows to call the clinic with any problems,  questions or concerns.    Cammie Sickle, MD 08/28/2019 1:28 PM

## 2019-08-27 NOTE — Progress Notes (Signed)
Contacted Dr. Dwyane Luo office per Dr. Rogue Bussing - spoke with Gwenette Greet. oncotype results not yet available.

## 2019-09-03 ENCOUNTER — Telehealth: Payer: Self-pay | Admitting: Internal Medicine

## 2019-09-03 NOTE — Telephone Encounter (Signed)
On 5/24-spoke to patient regarding results of Oncotype-risk of recurrence with AI alone is 17%.  Recommend follow-up to discuss adjuvant chemotherapy option.   C-please schedule appointment on June 2nd at 2:30. No labs.   Thanks, GB

## 2019-09-05 ENCOUNTER — Encounter: Payer: Self-pay | Admitting: General Surgery

## 2019-09-12 ENCOUNTER — Encounter: Payer: Self-pay | Admitting: Internal Medicine

## 2019-09-12 ENCOUNTER — Inpatient Hospital Stay: Payer: Medicare HMO | Attending: Internal Medicine | Admitting: Internal Medicine

## 2019-09-12 ENCOUNTER — Other Ambulatory Visit: Payer: Self-pay

## 2019-09-12 ENCOUNTER — Ambulatory Visit: Payer: Medicare HMO | Admitting: Radiation Oncology

## 2019-09-12 DIAGNOSIS — C779 Secondary and unspecified malignant neoplasm of lymph node, unspecified: Secondary | ICD-10-CM | POA: Insufficient documentation

## 2019-09-12 DIAGNOSIS — Z9011 Acquired absence of right breast and nipple: Secondary | ICD-10-CM | POA: Insufficient documentation

## 2019-09-12 DIAGNOSIS — C50511 Malignant neoplasm of lower-outer quadrant of right female breast: Secondary | ICD-10-CM | POA: Diagnosis not present

## 2019-09-12 DIAGNOSIS — Z170 Estrogen receptor positive status [ER+]: Secondary | ICD-10-CM | POA: Diagnosis not present

## 2019-09-12 DIAGNOSIS — F17210 Nicotine dependence, cigarettes, uncomplicated: Secondary | ICD-10-CM | POA: Diagnosis not present

## 2019-09-12 DIAGNOSIS — G620 Drug-induced polyneuropathy: Secondary | ICD-10-CM | POA: Diagnosis not present

## 2019-09-12 NOTE — Progress Notes (Signed)
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  Recurrent score 20 ~risk of recurrence with endocrine therapy-17%; no chemotherapy; declines radiation evaluation.  #Femara.   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  female right breast cancer ER/PR positive HER-2 negative is here for follow-up/post surgery is here for follow-up/to review results of the Oncotype.  Patient is currently healing well from surgery.  No postoperative complications.  She continues to go around with a walker/baseline because of her neuropathy.  She canceled her radiation appointment this morning; as she felt she is unable to keep to keep 2 appointments on the same day.   Review of Systems  Constitutional: Negative for chills, diaphoresis, fever, malaise/fatigue and weight loss.  HENT: Negative for nosebleeds and sore throat.   Eyes: Negative for double vision.  Respiratory: Positive for shortness of breath. Negative for cough, hemoptysis, sputum production and wheezing.    Cardiovascular: Negative for chest pain, palpitations, orthopnea and leg swelling.  Gastrointestinal: Negative for abdominal pain, blood in stool, constipation, diarrhea, heartburn, melena, nausea and vomiting.  Genitourinary: Negative for dysuria, frequency and urgency.  Musculoskeletal: Positive for back pain and joint pain.  Skin: Negative.  Negative for itching and rash.  Neurological: Positive for tingling. Negative for dizziness, focal weakness, weakness and headaches.  Endo/Heme/Allergies: Does not bruise/bleed easily.  Psychiatric/Behavioral: Negative for depression. The patient is not nervous/anxious and does not have insomnia.      MEDICAL HISTORY:  Past Medical History:  Diagnosis Date  . Arthritis   . Breast cancer ([place]) 2005   left  . Cancer ([place]) 7/05   breast; mastectomy, chemo and radiation  . Carotid artery disease ([place])   . Coronary artery disease   . Diabetes mellitus   . Emphysema lung ([place])   . GERD (gastroesophageal reflux disease)   . High cholesterol    on statin  . Hypertension   . 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 OSA on CPAP 04/02/2011  . Peripheral vascular occlusive disease ([place])    occluded left internal carotid and moderate right ICAstenosis  . Thoracic compression fracture [facility])     SURGICAL HISTORY: Past Surgical History:  Procedure Laterality Date  . APPENDECTOMY    . BREAST SURGERY    . CARDIAC CATHETERIZATION  2010   recath revealing non-critica CAD  . CARDIAC SURGERY     X3  . carotid doppler  10/20/09   right bulb and ICA 50-69%,left CCA occluded  . CHOLECYSTECTOMY    . CORONARY ANGIOPLASTY  2009   s/p LAD AND  CIRC STENT  . LAPAROTOMY  04/07/2011   BOWEL OBSTRUCTIONProcedure: EXPLORATORY LAPAROTOMY;  Surgeon: Donato Heinz, MD;  Location: [facility];  Service: General;  Laterality: N/A;  . MASTECTOMY Left 2005  . MASTECTOMY MODIFIED RADICAL Right 08/13/2019   Procedure: Modified Radical Mastectomy;  Surgeon: Robert Bellow, MD;  Location: [facility] ORS;  Service: General;  Laterality: Right;  right breast modified radical mastectomy  . NM MYOCAR PERF WALL MOTION  July 02, 2010   entirely normal  . THYROID SURGERY    . TUBAL LIGATION      SOCIAL HISTORY: Social History   Socioeconomic History  . Marital status: Divorced    Spouse name: Not on file  . Number of children: Not on file  . Years of education: Not on file  . Highest education level: Not on file  Occupational History  . Not on file  Tobacco Use  . Smoking status: Current Every Day Smoker    Packs/day: 1.00    Years: 34.00    Pack years: 34.00    Types: Cigarettes  . Smokeless tobacco: Never Used  . Tobacco comment: currently smoking less than a pack per day  Substance and Sexual Activity  . Alcohol use: No  . Drug use: No  . Sexual activity: Not on file  Other Topics Concern  . 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 Rolling walker- since starting 2021 [back pain]    Social Determinants of Health   Financial Resource Strain:   . Difficulty of Paying Living Expenses:   Food Insecurity:   . Worried About Charity fundraiser in the Last Year:   . Arboriculturist in the Last Year:   Transportation Needs:   . Film/video editor (Medical):   Marland Kitchen Lack of Transportation (Non-Medical):   Physical Activity:   . Days of Exercise per Week:   . Minutes of Exercise per Session:   Stress:   . Feeling of Stress :   Social Connections:   . Frequency of Communication with Friends and Family:   . Frequency of Social Gatherings with Friends and Family:   . Attends Religious Services:   . Active Member of Clubs or Organizations:   . Attends Archivist Meetings:   Marland Kitchen Marital Status:   Intimate Partner Violence:   . Fear of Current or Ex-Partner:   . Emotionally Abused:   Marland Kitchen Physically Abused:   . Sexually Abused:     FAMILY  HISTORY: Family History  Problem Relation Age of Onset  . Crohn's disease Sister   . Breast cancer Neg Hx     ALLERGIES:  has No Known Allergies.  MEDICATIONS:  Current Outpatient Medications  Medication Sig Dispense Refill  . ADVAIR DISKUS 250-50 MCG/DOSE AEPB Inhale 1 puff into the lungs in the morning and at bedtime.    Marland Kitchen albuterol (ACCUNEB) 1.25 MG/3ML nebulizer solution Take 1 ampule by nebulization in the morning and at bedtime.     Marland Kitchen aspirin 81 MG EC tablet Take 81 mg by mouth daily. Swallow whole.    Marland Kitchen atorvastatin (LIPITOR) 80 MG tablet Take 80 mg by mouth at bedtime.     Marland Kitchen buPROPion (WELLBUTRIN XL) 150 MG 24 hr tablet Take 150 mg by mouth every morning.     . clopidogrel (PLAVIX) 75 MG tablet Take 75 mg by mouth daily.     Marland Kitchen  furosemide (LASIX) 20 MG tablet Take 20 mg by mouth 2 (two) times daily.     Marland Kitchen gabapentin (NEURONTIN) 300 MG capsule Take 300 mg by mouth in the morning and at bedtime.    Marland Kitchen glipiZIDE (GLUCOTROL) 5 MG tablet Take 5 mg by mouth daily before breakfast.     . HYDROcodone-acetaminophen (NORCO/VICODIN) 5-325 MG tablet Take 1 tablet by mouth every 4 (four) hours as needed for moderate pain. 15 tablet 0  . INCRUSE ELLIPTA 62.5 MCG/INH AEPB Inhale 1 puff into the lungs every morning.     . isosorbide mononitrate (IMDUR) 60 MG 24 hr tablet Take 60 mg by mouth every morning.     Marland Kitchen letrozole (FEMARA) 2.5 MG tablet Take 1 tablet (2.5 mg total) by mouth daily. Once a day. (Patient taking differently: Take 2.5 mg by mouth every morning. Once a day.) 90 tablet 3  . levothyroxine (SYNTHROID, LEVOTHROID) 150 MCG tablet Take 150 mcg by mouth daily before breakfast.     . omeprazole (PRILOSEC OTC) 20 MG tablet Take 20 mg by mouth every morning.    . pioglitazone (ACTOS) 45 MG tablet Take 45 mg by mouth daily.     . pramipexole (MIRAPEX) 0.25 MG tablet Take 0.25 mg by mouth at bedtime.      No current facility-administered medications for this visit.      Marland Kitchen  PHYSICAL  EXAMINATION: ECOG PERFORMANCE STATUS: 0 - Asymptomatic  Vitals:   09/12/19 1413  BP: (!) 144/57  Pulse: 66  Temp: (!) 97.5 F (36.4 C)   Filed Weights   09/12/19 1413  Weight: 202 lb (91.6 kg)    Physical Exam  Constitutional: She is oriented to person, place, and time and well-developed, well-nourished, and in no distress.  Patient walking herself.   HENT:  Head: Normocephalic and atraumatic.  Mouth/Throat: Oropharynx is clear and moist. No oropharyngeal exudate.  Eyes: Pupils are equal, round, and reactive to light.  Cardiovascular: Normal rate and regular rhythm.  Pulmonary/Chest: Effort normal and breath sounds normal. No respiratory distress. She has no wheezes.  Abdominal: Soft. Bowel sounds are normal. She exhibits no distension and no mass. There is no abdominal tenderness. There is no rebound and no guarding.  Musculoskeletal:        General: No tenderness or edema. Normal range of motion.     Cervical back: Normal range of motion and neck supple.  Neurological: She is alert and oriented to person, place, and time.  Skin: Skin is warm.  Psychiatric: Affect normal.     LABORATORY DATA:  I have reviewed the data as listed Lab Results  Component Value Date   WBC 6.4 08/01/2019   HGB 13.6 08/01/2019   HCT 40.9 08/01/2019   MCV 97.6 08/01/2019   PLT 230 08/01/2019   Recent Labs    08/01/19 1016  NA 140  K 3.9  CL 100  CO2 28  GLUCOSE 204*  BUN 17  CREATININE 0.69  CALCIUM 9.0  GFRNONAA >60  GFRAA >60  PROT 7.3  ALBUMIN 3.9  AST 22  ALT 15  ALKPHOS 67  BILITOT 0.9    RADIOGRAPHIC STUDIES: I have personally reviewed the radiological images as listed and agreed with the findings in the report. No results found.  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 pT2 N1a.  S/p mastectomy/axial  lymph  node dissection.  Continue Femara for now.  # Discussed with regards to systemic therapy; Oncotype RS- 20; risk of relapse is appx-17% in the next 9 years with endocrine therapy alone.  In general given the moderate risk out I recommended adjuvant systemic therapy with chemotherapy.  However, in general patient is not a very good candidate for chemotherapy [walks with a walker; severe peripheral neuropathy; previous exposure to chemotherapy].  Also patient is in general reluctant with chemo. Patient cancelled her radiation appt this morning.  States that she spoke to her brother who is a Pension scheme manager.  # screening for osteoporosis-wants BMD thru PCP.    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  STABLE.   # I offered to call the patient's brother/Radiation oncologist  to give an update on clinical status; patient declines.   # DISPOSITION: # Follow up in 3 months-MD; no labs- Dr.B  All questions were answered. The patient/family knows to call the clinic with any problems, questions or concerns.    Cammie Sickle, MD 09/12/2019 4:45 PM

## 2019-09-12 NOTE — Assessment & Plan Note (Addendum)
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 pT2 N1a.  S/p mastectomy/axial lymph node dissection.  Continue Femara for now.  # Discussed with regards to systemic therapy; Oncotype RS- 20; risk of relapse is appx-17% in the next 9 years with endocrine therapy alone.  In general given the moderate risk out I recommended adjuvant systemic therapy with chemotherapy.  However, in general patient is not a very good candidate for chemotherapy [walks with a walker; severe peripheral neuropathy; previous exposure to chemotherapy].  Also patient is in general reluctant with chemo. Patient cancelled her radiation appt this morning.  States that she spoke to her brother who is a Pension scheme manager.  # screening for osteoporosis-wants BMD thru PCP.    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  STABLE.   # I offered to call the patient's brother/Radiation oncologist  to give an update on clinical status; patient declines.   # DISPOSITION: # Follow up in 3 months-MD; no labs- Dr.B

## 2019-09-18 ENCOUNTER — Other Ambulatory Visit: Payer: Self-pay | Admitting: General Surgery

## 2019-09-24 ENCOUNTER — Other Ambulatory Visit: Payer: Self-pay

## 2019-09-24 ENCOUNTER — Encounter
Admission: RE | Admit: 2019-09-24 | Discharge: 2019-09-24 | Disposition: A | Discharge status: Home / Self Care | Payer: Medicare HMO | Source: Ambulatory Visit | Attending: General Surgery | Admitting: General Surgery

## 2019-09-24 HISTORY — DX: Angina pectoris, unspecified: I20.9

## 2019-09-24 NOTE — Patient Instructions (Signed)
Your procedure is scheduled on: 09/28/19 Report to [place]. To find out your arrival time please call [phone] between 1PM - 3PM on 09/27/19.  Remember: Instructions that are not followed completely may result in serious medical risk, up to and including death, or upon the discretion of your surgeon and anesthesiologist your surgery may need to be rescheduled.     _X__ 1. Do not eat food after midnight the night before your procedure.                 No gum chewing or hard candies. You may drink clear liquids up to 2 hours                 before you are scheduled to arrive for your surgery- DO not drink clear                 liquids within 2 hours of the start of your surgery.                 Clear Liquids include:  water, apple juice without pulp, clear carbohydrate                 drink such as Clearfast or Gatorade, Black Coffee or Tea (Do not add                 anything to coffee or tea). Diabetics water only  __X__2.  On the morning of surgery brush your teeth with toothpaste and water, you                 may rinse your mouth with mouthwash if you wish.  Do not swallow any              toothpaste of mouthwash.     _X__ 3.  No Alcohol for 24 hours before or after surgery.   _X__ 4.  Do Not Smoke or use e-cigarettes For 24 Hours Prior to Your Surgery.                 Do not use any chewable tobacco products for at least 6 hours prior to                 surgery.  ____  5.  Bring all medications with you on the day of surgery if instructed.   __X__  6.  Notify your doctor if there is any change in your medical condition      (cold, fever, infections).     Do not wear jewelry, make-up, hairpins, clips or nail polish. Do not wear lotions, powders, or perfumes.  Do not shave 48 hours prior to surgery. Men may shave face and neck. Do not bring valuables to the hospital.    [facility] is not responsible for any belongings or  valuables.  Contacts, dentures/partials or body piercings may not be worn into surgery. Bring a case for your contacts, glasses or hearing aids, a denture cup will be supplied. Leave your suitcase in the car. After surgery it may be brought to your room. For patients admitted to the hospital, discharge time is determined by your treatment team.   Patients discharged the day of surgery will not be allowed to drive home.   Please read over the following fact sheets that you were given:   MRSA Information  __X__ Take these medicines the morning of surgery with A SIP OF WATER:  1. buPROPion (WELLBUTRIN XL) 150 MG 24 hr tablet  2. gabapentin (NEURONTIN) 300 MG capsule  3. isosorbide mononitrate (IMDUR) 60 MG 24 hr tablet  4. levothyroxine (SYNTHROID, LEVOTHROID) 150 MCG tablet  5. metoprolol succinate (TOPROL-XL) 25 MG 24 hr tablet  6. omeprazole (PRILOSEC OTC) 20 MG tablet  7.  ____ Fleet Enema (as directed)   __X__ Use CHG Soap/SAGE wipes as directed  __X__ Use inhalers on the day of surgery ALSO USE YOUR NEBULIZER  ____ Stop metformin/Janumet/Farxiga 2 days prior to surgery    ____ Take 1/2 of usual insulin dose the night before surgery. No insulin the morning          of surgery.   __X__ Stop Blood Thinners Coumadin/Plavix/Xarelto/Pleta/Pradaxa/Eliquis/Effient/  STOPPED 6/11  __X__ Stop Anti-inflammatories 7 days before surgery such as Advil, Ibuprofen, Motrin,  BC or Goodies Powder, Naprosyn, Naproxen, Aleve,    __X__ Stop all herbal supplements, fish oil or vitamin E until after surgery.    ____ Bring C-Pap to the hospital.

## 2019-09-26 ENCOUNTER — Other Ambulatory Visit: Payer: Self-pay

## 2019-09-26 ENCOUNTER — Other Ambulatory Visit
Admission: RE | Admit: 2019-09-26 | Discharge: 2019-09-26 | Disposition: A | Discharge status: Home / Self Care | Payer: Medicare HMO | Source: Ambulatory Visit | Attending: General Surgery | Admitting: General Surgery

## 2019-09-26 DIAGNOSIS — Z01812 Encounter for preprocedural laboratory examination: Secondary | ICD-10-CM | POA: Diagnosis present

## 2019-09-26 DIAGNOSIS — Z20822 Contact with and (suspected) exposure to covid-19: Secondary | ICD-10-CM | POA: Insufficient documentation

## 2019-09-27 LAB — SARS CORONAVIRUS 2 (TAT 6-24 HRS): SARS Coronavirus 2: NEGATIVE

## 2019-09-28 ENCOUNTER — Encounter: Payer: Self-pay | Admitting: General Surgery

## 2019-09-28 ENCOUNTER — Ambulatory Visit: Payer: Medicare HMO | Admitting: Certified Registered Nurse Anesthetist

## 2019-09-28 ENCOUNTER — Encounter
Admission: RE | Disposition: A | Discharge status: Home / Self Care | Payer: Self-pay | Source: Home / Self Care | Attending: General Surgery

## 2019-09-28 ENCOUNTER — Other Ambulatory Visit: Payer: Self-pay

## 2019-09-28 ENCOUNTER — Ambulatory Visit
Admission: RE | Admit: 2019-09-28 | Discharge: 2019-09-28 | Disposition: A | Discharge status: Home / Self Care | Payer: Medicare HMO | Attending: General Surgery | Admitting: General Surgery

## 2019-09-28 DIAGNOSIS — G4733 Obstructive sleep apnea (adult) (pediatric): Secondary | ICD-10-CM | POA: Diagnosis not present

## 2019-09-28 DIAGNOSIS — I2510 Atherosclerotic heart disease of native coronary artery without angina pectoris: Secondary | ICD-10-CM | POA: Diagnosis not present

## 2019-09-28 DIAGNOSIS — Z7984 Long term (current) use of oral hypoglycemic drugs: Secondary | ICD-10-CM | POA: Insufficient documentation

## 2019-09-28 DIAGNOSIS — F17210 Nicotine dependence, cigarettes, uncomplicated: Secondary | ICD-10-CM | POA: Insufficient documentation

## 2019-09-28 DIAGNOSIS — I6523 Occlusion and stenosis of bilateral carotid arteries: Secondary | ICD-10-CM | POA: Diagnosis not present

## 2019-09-28 DIAGNOSIS — Z955 Presence of coronary angioplasty implant and graft: Secondary | ICD-10-CM | POA: Diagnosis not present

## 2019-09-28 DIAGNOSIS — C50911 Malignant neoplasm of unspecified site of right female breast: Secondary | ICD-10-CM | POA: Insufficient documentation

## 2019-09-28 DIAGNOSIS — Z923 Personal history of irradiation: Secondary | ICD-10-CM | POA: Insufficient documentation

## 2019-09-28 DIAGNOSIS — Z9013 Acquired absence of bilateral breasts and nipples: Secondary | ICD-10-CM | POA: Diagnosis not present

## 2019-09-28 DIAGNOSIS — Z9221 Personal history of antineoplastic chemotherapy: Secondary | ICD-10-CM | POA: Insufficient documentation

## 2019-09-28 DIAGNOSIS — E1151 Type 2 diabetes mellitus with diabetic peripheral angiopathy without gangrene: Secondary | ICD-10-CM | POA: Insufficient documentation

## 2019-09-28 DIAGNOSIS — T86828 Other complications of skin graft (allograft) (autograft): Secondary | ICD-10-CM | POA: Insufficient documentation

## 2019-09-28 DIAGNOSIS — I10 Essential (primary) hypertension: Secondary | ICD-10-CM | POA: Insufficient documentation

## 2019-09-28 DIAGNOSIS — K219 Gastro-esophageal reflux disease without esophagitis: Secondary | ICD-10-CM | POA: Insufficient documentation

## 2019-09-28 DIAGNOSIS — Z79899 Other long term (current) drug therapy: Secondary | ICD-10-CM | POA: Insufficient documentation

## 2019-09-28 DIAGNOSIS — Y832 Surgical operation with anastomosis, bypass or graft as the cause of abnormal reaction of the patient, or of later complication, without mention of misadventure at the time of the procedure: Secondary | ICD-10-CM | POA: Diagnosis not present

## 2019-09-28 DIAGNOSIS — E7800 Pure hypercholesterolemia, unspecified: Secondary | ICD-10-CM | POA: Diagnosis not present

## 2019-09-28 DIAGNOSIS — J439 Emphysema, unspecified: Secondary | ICD-10-CM | POA: Diagnosis not present

## 2019-09-28 HISTORY — PX: WOUND DEBRIDEMENT: SHX247

## 2019-09-28 LAB — GLUCOSE, CAPILLARY
Glucose-Capillary: 156 mg/dL — ABNORMAL HIGH (ref 70–99)
Glucose-Capillary: 127 mg/dL — ABNORMAL HIGH (ref 70–99)

## 2019-09-28 SURGERY — DEBRIDEMENT, WOUND
Anesthesia: General | Laterality: Right

## 2019-09-28 MED ORDER — ONDANSETRON HCL 4 MG/2ML IJ SOLN: 4.0000 mg | Freq: Once | INTRAMUSCULAR | Status: DC | PRN

## 2019-09-28 MED ORDER — CHLORHEXIDINE GLUCONATE 0.12 % MT SOLN: 15.0000 mL | Freq: Once | OROMUCOSAL | Status: AC

## 2019-09-28 MED ORDER — EPHEDRINE 5 MG/ML INJ
INTRAVENOUS | Status: AC
  Filled 2019-09-28: qty 10

## 2019-09-28 MED ORDER — HYDROCODONE-ACETAMINOPHEN 5-325 MG PO TABS: 1.0000 | ORAL_TABLET | Freq: Once | ORAL | Status: AC

## 2019-09-28 MED ORDER — ORAL CARE MOUTH RINSE: 15.0000 mL | Freq: Once | OROMUCOSAL | Status: AC

## 2019-09-28 MED ORDER — ACETAMINOPHEN 10 MG/ML IV SOLN
INTRAVENOUS | Status: DC | PRN
  Administered 2019-09-28: 1000 mg via INTRAVENOUS

## 2019-09-28 MED ORDER — PROPOFOL 10 MG/ML IV BOLUS
INTRAVENOUS | Status: DC | PRN
Start: 2019-09-28 — End: 2019-09-28
  Administered 2019-09-28: 140 mg via INTRAVENOUS
  Administered 2019-09-28: 30 mg via INTRAVENOUS

## 2019-09-28 MED ORDER — ACETAMINOPHEN 10 MG/ML IV SOLN
INTRAVENOUS | Status: AC
  Filled 2019-09-28: qty 100

## 2019-09-28 MED ORDER — ONDANSETRON HCL 4 MG/2ML IJ SOLN
INTRAMUSCULAR | Status: DC | PRN
Start: 2019-09-28 — End: 2019-09-28
  Administered 2019-09-28: 4 mg via INTRAVENOUS

## 2019-09-28 MED ORDER — PROPOFOL 500 MG/50ML IV EMUL
INTRAVENOUS | Status: DC | PRN
Start: 2019-09-28 — End: 2019-09-28
  Administered 2019-09-28: 10 ug/kg/min via INTRAVENOUS

## 2019-09-28 MED ORDER — FENTANYL CITRATE (PF) 100 MCG/2ML IJ SOLN: 25.0000 ug | INTRAMUSCULAR | Status: DC | PRN

## 2019-09-28 MED ORDER — PROPOFOL 10 MG/ML IV BOLUS
INTRAVENOUS | Status: AC
  Filled 2019-09-28: qty 20

## 2019-09-28 MED ORDER — HYDROCODONE-ACETAMINOPHEN 5-325 MG PO TABS
ORAL_TABLET | ORAL | Status: AC
  Administered 2019-09-28: 1 via ORAL
  Filled 2019-09-28: qty 1

## 2019-09-28 MED ORDER — EPHEDRINE SULFATE 50 MG/ML IJ SOLN
INTRAMUSCULAR | Status: DC | PRN
Start: 2019-09-28 — End: 2019-09-28
  Administered 2019-09-28: 10 mg via INTRAVENOUS
  Administered 2019-09-28: 5 mg via INTRAVENOUS

## 2019-09-28 MED ORDER — FENTANYL CITRATE (PF) 100 MCG/2ML IJ SOLN
INTRAMUSCULAR | Status: AC
  Filled 2019-09-28: qty 2

## 2019-09-28 MED ORDER — DEXMEDETOMIDINE HCL IN NACL 200 MCG/50ML IV SOLN
INTRAVENOUS | Status: DC | PRN
Start: 2019-09-28 — End: 2019-09-28
  Administered 2019-09-28 (×3): 4 ug via INTRAVENOUS
  Administered 2019-09-28: 8 ug via INTRAVENOUS

## 2019-09-28 MED ORDER — LIDOCAINE HCL (CARDIAC) PF 100 MG/5ML IV SOSY
PREFILLED_SYRINGE | INTRAVENOUS | Status: DC | PRN
Start: 2019-09-28 — End: 2019-09-28
  Administered 2019-09-28: 100 mg via INTRATRACHEAL

## 2019-09-28 MED ORDER — FENTANYL CITRATE (PF) 100 MCG/2ML IJ SOLN
INTRAMUSCULAR | Status: DC | PRN
  Administered 2019-09-28: 50 ug via INTRAVENOUS
  Administered 2019-09-28 (×2): 25 ug via INTRAVENOUS
  Administered 2019-09-28: 50 ug via INTRAVENOUS
  Administered 2019-09-28 (×2): 25 ug via INTRAVENOUS

## 2019-09-28 MED ORDER — FENTANYL CITRATE (PF) 100 MCG/2ML IJ SOLN
INTRAMUSCULAR | Status: AC
  Administered 2019-09-28: 25 ug via INTRAVENOUS
  Filled 2019-09-28: qty 2

## 2019-09-28 MED ORDER — SODIUM CHLORIDE 0.9 % IV SOLN: INTRAVENOUS | Status: DC

## 2019-09-28 MED ORDER — CEFAZOLIN SODIUM-DEXTROSE 2-4 GM/100ML-% IV SOLN
2.0000 g | INTRAVENOUS | Status: AC
  Administered 2019-09-28: 2 g via INTRAVENOUS

## 2019-09-28 MED ORDER — CHLORHEXIDINE GLUCONATE 0.12 % MT SOLN
OROMUCOSAL | Status: AC
  Administered 2019-09-28: 15 mL via OROMUCOSAL
  Filled 2019-09-28: qty 15

## 2019-09-28 MED ORDER — HYDROCODONE-ACETAMINOPHEN 5-325 MG PO TABS: 1.0000 | ORAL_TABLET | ORAL | 0 refills | Status: DC | PRN

## 2019-09-28 MED ORDER — CEFAZOLIN SODIUM-DEXTROSE 2-4 GM/100ML-% IV SOLN
INTRAVENOUS | Status: AC
  Filled 2019-09-28: qty 100

## 2019-09-28 MED ORDER — BUPIVACAINE HCL (PF) 0.5 % IJ SOLN
INTRAMUSCULAR | Status: DC | PRN
  Administered 2019-09-28: 30 mL

## 2019-09-28 MED ORDER — SODIUM CHLORIDE 0.9 % IV SOLN
INTRAVENOUS | Status: DC | PRN
Start: 2019-09-28 — End: 2019-09-28

## 2019-09-28 SURGICAL SUPPLY — 46 items
BNDG ELASTIC 4X5.8 VLCR NS LF (GAUZE/BANDAGES/DRESSINGS) IMPLANT
BNDG ELASTIC 6X5.8 VLCR NS LF (GAUZE/BANDAGES/DRESSINGS) IMPLANT
BNDG GAUZE 4.5X4.1 6PLY STRL (MISCELLANEOUS) IMPLANT
BULB RESERV EVAC DRAIN JP 100C (MISCELLANEOUS) ×3 IMPLANT
CANISTER SUCT 1200ML W/VALVE (MISCELLANEOUS) ×3 IMPLANT
CHLORAPREP W/TINT 26 (MISCELLANEOUS) ×3 IMPLANT
CLOSURE WOUND 1/2 X4 (GAUZE/BANDAGES/DRESSINGS)
COVER WAND RF STERILE (DRAPES) ×3 IMPLANT
DRAIN CHANNEL JP 15F RND 16 (MISCELLANEOUS) ×3 IMPLANT
DRAPE 3/4 80X56 (DRAPES) ×3 IMPLANT
DRAPE LAPAROTOMY 100X77 ABD (DRAPES) ×3 IMPLANT
DRAPE LAPAROTOMY TRNSV 106X77 (MISCELLANEOUS) ×3 IMPLANT
DRSG GAUZE FLUFF 36X18 (GAUZE/BANDAGES/DRESSINGS) ×3 IMPLANT
DRSG OPSITE POSTOP 4X6 (GAUZE/BANDAGES/DRESSINGS) ×3 IMPLANT
DRSG OPSITE POSTOP 4X8 (GAUZE/BANDAGES/DRESSINGS) ×3 IMPLANT
DRSG TEGADERM 4X4.75 (GAUZE/BANDAGES/DRESSINGS) ×3 IMPLANT
DRSG TELFA 3X8 NADH (GAUZE/BANDAGES/DRESSINGS) ×6 IMPLANT
DRSG TELFA 4X3 1S NADH ST (GAUZE/BANDAGES/DRESSINGS) IMPLANT
ELECT REM PT RETURN 9FT ADLT (ELECTROSURGICAL) ×3
ELECTRODE REM PT RTRN 9FT ADLT (ELECTROSURGICAL) ×1 IMPLANT
GAUZE SPONGE 4X4 12PLY STRL (GAUZE/BANDAGES/DRESSINGS) IMPLANT
GLOVE BIO SURGEON STRL SZ7.5 (GLOVE) ×3 IMPLANT
GLOVE INDICATOR 8.0 STRL GRN (GLOVE) ×3 IMPLANT
GOWN STRL REUS W/ TWL LRG LVL3 (GOWN DISPOSABLE) ×2 IMPLANT
GOWN STRL REUS W/TWL LRG LVL3 (GOWN DISPOSABLE) ×6
KIT TURNOVER KIT A (KITS) ×3 IMPLANT
MARGIN MAP 10MM (MISCELLANEOUS) ×3 IMPLANT
MATRIX WOUND 3-LAYER 7X10 (Tissue) ×2 IMPLANT
NS IRRIG 500ML POUR BTL (IV SOLUTION) ×3 IMPLANT
PACK BASIN MINOR (MISCELLANEOUS) ×3 IMPLANT
PAD PREP 24X41 OB/GYN DISP (PERSONAL CARE ITEMS) IMPLANT
STAPLER SKIN PROX 35W (STAPLE) ×3 IMPLANT
STOCKINETTE STRL 6IN 960660 (GAUZE/BANDAGES/DRESSINGS) IMPLANT
STRIP CLOSURE SKIN 1/2X4 (GAUZE/BANDAGES/DRESSINGS) IMPLANT
SUT ETHILON 4-0 (SUTURE) ×3
SUT ETHILON 4-0 FS2 18XMFL BLK (SUTURE) ×1
SUT VIC AB 2-0 CT1 (SUTURE) ×6 IMPLANT
SUT VIC AB 3-0 SH 27 (SUTURE) ×3
SUT VIC AB 3-0 SH 27X BRD (SUTURE) ×1 IMPLANT
SUT VIC AB 4-0 FS2 27 (SUTURE) ×3 IMPLANT
SUT VICRYL+ 3-0 144IN (SUTURE) ×3 IMPLANT
SUTURE ETHLN 4-0 FS2 18XMF BLK (SUTURE) ×1 IMPLANT
SWAB DUAL CULTURE TRANS RED ST (MISCELLANEOUS) IMPLANT
SWABSTK COMLB BENZOIN TINCTURE (MISCELLANEOUS) IMPLANT
SYR BULB IRRIG 60ML STRL (SYRINGE) ×3 IMPLANT
WOUND MATRIX 3-LAYER 7X10 (Tissue) ×1 IMPLANT

## 2019-09-28 NOTE — Progress Notes (Signed)
Some drainage on the lateral aspect of the dressing.  With the long weekend ahead, elected to re-inforce the area with ABD pads x3 and a 6" ace wrap.

## 2019-09-28 NOTE — Discharge Instructions (Signed)

## 2019-09-28 NOTE — Anesthesia Procedure Notes (Signed)
Procedure Name: LMA Insertion Date/Time: 09/28/2019 12:50 PM Performed by: Gayland Curry, CRNA Pre-anesthesia Checklist: Patient identified, Emergency Drugs available, Suction available and Patient being monitored Patient Re-evaluated:Patient Re-evaluated prior to induction Oxygen Delivery Method: Circle system utilized Preoxygenation: Pre-oxygenation with 100% oxygen Induction Type: IV induction LMA: LMA inserted LMA Size: 3.0 Number of attempts: 1 Tube secured with: Tape Dental Injury: Teeth and Oropharynx as per pre-operative assessment  Comments: ambu aura straight 3.0 LMA

## 2019-09-28 NOTE — Op Note (Signed)
Preoperative diagnosis: Mastectomy flap necrosis, focal positive inferior anterior margin.Marland Kitchen  Postoperative diagnosis: Same.  Operative procedure: Wound debridement, reexcision of a 2 x 2.5 cm wedge of the lower inferior flap, placement of 7 x 10 cm ACell graft, drainage.  Operating surgeon: Hervey Ard, MD.  Anesthesia: General by LMA, Marcaine 0.5%, plain, 30 cc.  Estimated blood loss: 50 cc.  Clinical note: The patient underwent mastectomy and axillary dissection on Aug 13, 2019 for a new breast cancer with known positive adenopathy.  The patient had been less than excited about the idea of adjuvant chemotherapy or neoadjuvant treatment and was elected to complete an axillary dissection.  She showed breakdownof the superior medial flap.  She is brought to the operative this time for planned debridement.  Her mastectomy specimen had shown a focal area of positivity.  This area been clinically suspicious at the time of surgery and had been marked with blue sutures.  Plans were to resect this area at the same time.  Operative note: The patient underwent general anesthesia and tolerated this well.  The chest wound, and axilla was cleansed with Betadine solution and draped.  The necrotic tissue was excised and discarded.  The lateral aspect of the wound was opened to allow exposure of the inferior flap and identification of the previously placed blue sutures.  A 2 x 2.5 cm wedge of tissue was resected and orientated and sent in formalin for routine histology.  This is the new anterior margin for the section of the mastectomy.  The chest wall flaps were mobilized circumferentially.  Marked thickening noted especially on the superior lateral aspect.  The wedge was closed from reexcision of the anterior margin but this still left a 5 x 7 cm defect.  It was elected to place a 7 x 10 cm fenestrated 3 layer ACell mesh.  This was covered by multiple layers of Tegaderm followed by an occlusive dressing with  a honeycomb.  Prior to this placement a 15-gauge Blake drain was brought out through a separate stab wound incision in the inferior medial flap and anchored into place with 3-0 nylon.  The area appeared to be intact when suction was applied to the drain.  Patient was taken recovery in stable condition.

## 2019-09-28 NOTE — Transfer of Care (Signed)
Immediate Anesthesia Transfer of Care Note  Patient: Michaela Wilcox  Procedure(s) Performed: DEBRIDEMENT WOUND RIGHT MASTECTOMY SITE (Right )  Patient Location: PACU  Anesthesia Type:General  Level of Consciousness: awake, alert , oriented and patient cooperative  Airway & Oxygen Therapy: Patient Spontanous Breathing  Post-op Assessment: Report given to RN and Post -op Vital signs reviewed and stable  Post vital signs: Reviewed and stable  Last Vitals:  Vitals Value Taken Time  BP 126/50 09/28/19 1354  Temp    Pulse 71 09/28/19 1355  Resp 16 09/28/19 1355  SpO2 96 % 09/28/19 1355  Vitals shown include unvalidated device data.  Last Pain:  Vitals:   09/28/19 1201  TempSrc: Temporal  PainSc: 0-No pain         Complications: No complications documented.

## 2019-09-28 NOTE — Anesthesia Postprocedure Evaluation (Signed)
Anesthesia Post Note  Patient: Michaela Wilcox  Procedure(s) Performed: DEBRIDEMENT WOUND RIGHT MASTECTOMY SITE (Right )  Patient location during evaluation: PACU Anesthesia Type: General Level of consciousness: awake and alert Pain management: pain level controlled Vital Signs Assessment: post-procedure vital signs reviewed and stable Respiratory status: spontaneous breathing and respiratory function stable Cardiovascular status: stable Anesthetic complications: no   No complications documented.   Last Vitals:  Vitals:   09/28/19 1430 09/28/19 1442  BP: 116/64 126/64  Pulse: 62 60  Resp: (!) 25 16  Temp: (!) 36.2 C (!) 36.2 C  SpO2: 97% 96%    Last Pain:  Vitals:   09/28/19 1442  TempSrc: Temporal  PainSc: 1                  Halona Amstutz K

## 2019-09-28 NOTE — H&P (Signed)
Michaela Wilcox [id number] 10/04/1950     HPI:  Patient s/p modified radical mastectomy who has developed focal flap necrosis.  For debridement.   No medications prior to admission.   No Known Allergies Past Medical History:  Diagnosis Date  . Anginal pain ([place])   . Arthritis   . Breast cancer ([place]) 2005   left  . Cancer ([place]) 7/05   breast; mastectomy, chemo and radiation  . Carotid artery disease ([place])   . Coronary artery disease   . Diabetes mellitus   . Emphysema lung ([place])   . GERD (gastroesophageal reflux disease)   . High cholesterol    on statin  . Hypertension   . 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 OSA on CPAP 04/02/2011  . Peripheral vascular occlusive disease ([place])    occluded left internal carotid and moderate right ICAstenosis  . Thoracic compression fracture [facility])    Past Surgical History:  Procedure Laterality Date  . APPENDECTOMY    . BREAST SURGERY    . CARDIAC CATHETERIZATION  2010   recath revealing non-critica CAD  . CARDIAC SURGERY     X3  . carotid doppler  10/20/09   right bulb and ICA 50-69%,left CCA occluded  . CHOLECYSTECTOMY    . CORONARY ANGIOPLASTY  2009   s/p LAD AND CIRC STENT  . LAPAROTOMY  04/07/2011   BOWEL OBSTRUCTIONProcedure: EXPLORATORY LAPAROTOMY;  Surgeon: Donato Heinz, MD;  Location: [facility];  Service: General;  Laterality: N/A;  . MASTECTOMY Left 2005  . MASTECTOMY MODIFIED RADICAL Right 08/13/2019   Procedure: Modified Radical Mastectomy;  Surgeon: Robert Bellow, MD;  Location: [facility] ORS;  Service: General;  Laterality: Right;  right breast modified radical mastectomy  . NM MYOCAR PERF WALL MOTION  July 02, 2010   entirely normal  . THYROID SURGERY    . TUBAL LIGATION     Social History   Socioeconomic History  . Marital status: Divorced    Spouse name: Not on file  . Number of children: Not on file  . Years of education: Not on file  . Highest education level: Not on file  Occupational History  .  Not on file  Tobacco Use  . Smoking status: Current Every Day Smoker    Packs/day: 1.00    Years: 34.00    Pack years: 34.00    Types: Cigarettes  . Smokeless tobacco: Never Used  . Tobacco comment: currently smoking less than a pack per day  Vaping Use  . Vaping Use: Never used  Substance and Sexual Activity  . Alcohol use: No  . Drug use: No  . Sexual activity: Not on file  Other Topics Concern  . 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 Rolling walker- since starting 2021 [back pain]    Social Determinants of Health   Financial Resource Strain:   . Difficulty of Paying Living Expenses:   Food Insecurity:   . Worried About Charity fundraiser in the Last Year:   . Arboriculturist in the Last Year:   Transportation Needs:   . Film/video editor (Medical):   Marland Kitchen Lack of Transportation (Non-Medical):   Physical Activity:   . Days of Exercise per Week:   . Minutes of Exercise per Session:   Stress:   . Feeling of Stress :   Social Connections:   .  Frequency of Communication with Friends and Family:   . Frequency of Social Gatherings with Friends and Family:   . Attends Religious Services:   . Active Member of Clubs or Organizations:   . Attends Archivist Meetings:   Marland Kitchen Marital Status:   Intimate Partner Violence:   . Fear of Current or Ex-Partner:   . Emotionally Abused:   Marland Kitchen Physically Abused:   . 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 Rolling walker- since starting 2021 [back pain]      ROS: Negative.     PE: HEENT: Negative. Lungs: Clear. Cardio: RR.   Assessment/Plan:  Proceed with planned debridement of mastectomy flap.    Forest Gleason [facility] 09/28/2019

## 2019-09-28 NOTE — Anesthesia Preprocedure Evaluation (Signed)
Anesthesia Evaluation  Patient identified by MRN, date of birth, ID band Patient awake    Reviewed: Allergy & Precautions, NPO status , Patient's Chart, lab work & pertinent test results  History of Anesthesia Complications Negative for: history of anesthetic complications  Airway Mallampati: II       Dental   Pulmonary sleep apnea and Continuous Positive Airway Pressure Ventilation , COPD,  COPD inhaler, Current Smoker,           Cardiovascular hypertension, Pt. on medications + CAD  (-) CHF (-) dysrhythmias (-) Valvular Problems/Murmurs     Neuro/Psych neg Seizures    GI/Hepatic Neg liver ROS, PUD, GERD  Medicated,  Endo/Other  diabetes, Type 2, Oral Hypoglycemic AgentsHypothyroidism   Renal/GU negative Renal ROS     Musculoskeletal   Abdominal   Peds  Hematology   Anesthesia Other Findings   Reproductive/Obstetrics                             Anesthesia Physical Anesthesia Plan  ASA: III  Anesthesia Plan: General   Post-op Pain Management:    Induction: Intravenous  PONV Risk Score and Plan: 2 and Ondansetron and Treatment may vary due to age or medical condition  Airway Management Planned: LMA and Oral ETT  Additional Equipment:   Intra-op Plan:   Post-operative Plan:   Informed Consent: I have reviewed the patients History and Physical, chart, labs and discussed the procedure including the risks, benefits and alternatives for the proposed anesthesia with the patient or authorized representative who has indicated his/her understanding and acceptance.       Plan Discussed with:   Anesthesia Plan Comments:         Anesthesia Quick Evaluation

## 2019-10-01 ENCOUNTER — Encounter: Payer: Self-pay | Admitting: General Surgery

## 2019-10-02 ENCOUNTER — Other Ambulatory Visit: Payer: Self-pay | Admitting: Anatomic Pathology & Clinical Pathology

## 2019-10-02 LAB — SURGICAL PATHOLOGY

## 2019-10-02 LAB — AEROBIC/ANAEROBIC CULTURE W GRAM STAIN (SURGICAL/DEEP WOUND)

## 2019-11-01 ENCOUNTER — Other Ambulatory Visit: Payer: Self-pay | Admitting: Orthopedic Surgery

## 2019-11-01 DIAGNOSIS — S22080A Wedge compression fracture of T11-T12 vertebra, initial encounter for closed fracture: Secondary | ICD-10-CM

## 2019-11-17 ENCOUNTER — Ambulatory Visit
Admission: RE | Admit: 2019-11-17 | Discharge: 2019-11-17 | Disposition: A | Discharge status: Home / Self Care | Payer: Medicare HMO | Source: Ambulatory Visit | Attending: Orthopedic Surgery | Admitting: Orthopedic Surgery

## 2019-11-17 ENCOUNTER — Other Ambulatory Visit: Payer: Self-pay

## 2019-11-17 DIAGNOSIS — S22080A Wedge compression fracture of T11-T12 vertebra, initial encounter for closed fracture: Secondary | ICD-10-CM | POA: Insufficient documentation

## 2019-12-12 ENCOUNTER — Other Ambulatory Visit: Payer: Self-pay

## 2019-12-12 ENCOUNTER — Encounter: Payer: Self-pay | Admitting: Internal Medicine

## 2019-12-12 ENCOUNTER — Inpatient Hospital Stay: Payer: Medicare HMO | Attending: Internal Medicine | Admitting: Internal Medicine

## 2019-12-12 DIAGNOSIS — Z9011 Acquired absence of right breast and nipple: Secondary | ICD-10-CM | POA: Insufficient documentation

## 2019-12-12 DIAGNOSIS — G620 Drug-induced polyneuropathy: Secondary | ICD-10-CM | POA: Diagnosis not present

## 2019-12-12 DIAGNOSIS — F17210 Nicotine dependence, cigarettes, uncomplicated: Secondary | ICD-10-CM | POA: Insufficient documentation

## 2019-12-12 DIAGNOSIS — C50511 Malignant neoplasm of lower-outer quadrant of right female breast: Secondary | ICD-10-CM | POA: Diagnosis present

## 2019-12-12 DIAGNOSIS — Z170 Estrogen receptor positive status [ER+]: Secondary | ICD-10-CM

## 2019-12-12 NOTE — Assessment & Plan Note (Addendum)
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 pT2 N1a.  S/p mastectomy- with positive margin- needing re-resection.  Final clear margins however monitoring issues. Oncotype RS- 20; risk of relapse is appx-17% in the next 9 years with endocrine therapy alone.  Poor candidate for chemotherapy/comorbidities neuropathy patient preference.  Continue letrozole.  #S/p mastectomy-unclear margins however monitoring issues.  Declines radiation oncology evaluation.   # screening for osteoporosis-wants BMD thru PCP.    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  # DISPOSITION: # Follow up in 4 months-MD; no labs- Dr.B

## 2019-12-12 NOTE — Progress Notes (Signed)
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  Recurrent score 20 ~risk of recurrence with endocrine therapy-17%; no chemotherapy; declines radiation evaluation.  #Femara.   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  female right breast cancer ER/PR positive HER-2 negative currently on letrozole is here for follow-up.   Patient states her postmastectomy wound is healing well.  No further complications.  She continues to go around with a walker at baseline because of neuropathy.  No chest pain or shortness of breath cough.  Chronic joint pains back pain.  Review of Systems  Constitutional: Positive for malaise/fatigue. Negative for chills, diaphoresis, fever and weight loss.  HENT: Negative for nosebleeds and sore throat.   Eyes: Negative for double vision.  Respiratory: Positive for shortness of breath. Negative for cough, hemoptysis, sputum production and wheezing.   Cardiovascular: Negative for chest pain,  palpitations, orthopnea and leg swelling.  Gastrointestinal: Negative for abdominal pain, blood in stool, constipation, diarrhea, heartburn, melena, nausea and vomiting.  Genitourinary: Negative for dysuria, frequency and urgency.  Musculoskeletal: Positive for back pain and joint pain.  Skin: Negative.  Negative for itching and rash.  Neurological: Positive for tingling. Negative for dizziness, focal weakness, weakness and headaches.  Endo/Heme/Allergies: Does not bruise/bleed easily.  Psychiatric/Behavioral: Negative for depression. The patient is not nervous/anxious and does not have insomnia.      MEDICAL HISTORY:  Past Medical History:  Diagnosis Date  . Anginal pain ([place])   . Arthritis   . Breast cancer ([place]) 2005   left  . Cancer ([place]) 7/05   breast; mastectomy, chemo and radiation  . Carotid artery disease ([place])   . Coronary artery disease   . Diabetes mellitus   . Emphysema lung ([place])   . GERD (gastroesophageal reflux disease)   . High cholesterol    on statin  . Hypertension   . 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 OSA on CPAP 04/02/2011  . Peripheral vascular occlusive disease ([place])    occluded left internal carotid and moderate right ICAstenosis  . Thoracic compression fracture [facility])     SURGICAL HISTORY: Past Surgical History:  Procedure Laterality Date  . APPENDECTOMY    . BREAST SURGERY    . CARDIAC CATHETERIZATION  2010   recath revealing non-critica CAD  . CARDIAC SURGERY     X3  . carotid doppler  10/20/09   right bulb and ICA 50-69%,left CCA occluded  . CHOLECYSTECTOMY    . CORONARY ANGIOPLASTY  2009   s/p LAD AND CIRC STENT  .  LAPAROTOMY  04/07/2011   BOWEL OBSTRUCTIONProcedure: EXPLORATORY LAPAROTOMY;  Surgeon: Donato Heinz, MD;  Location: [facility];  Service: General;  Laterality: N/A;  . MASTECTOMY Left 2005  . MASTECTOMY MODIFIED RADICAL Right 08/13/2019   Procedure: Modified Radical Mastectomy;  Surgeon: Robert Bellow, MD;   Location: [facility] ORS;  Service: General;  Laterality: Right;  right breast modified radical mastectomy  . NM MYOCAR PERF WALL MOTION  July 02, 2010   entirely normal  . THYROID SURGERY    . TUBAL LIGATION    . WOUND DEBRIDEMENT Right 09/28/2019   Procedure: DEBRIDEMENT WOUND RIGHT MASTECTOMY SITE;  Surgeon: Robert Bellow, MD;  Location: [facility] ORS;  Service: General;  Laterality: Right;    SOCIAL HISTORY: Social History   Socioeconomic History  . Marital status: Divorced    Spouse name: Not on file  . Number of children: Not on file  . Years of education: Not on file  . Highest education level: Not on file  Occupational History  . Not on file  Tobacco Use  . Smoking status: Current Every Day Smoker    Packs/day: 1.00    Years: 34.00    Pack years: 34.00    Types: Cigarettes  . Smokeless tobacco: Never Used  . Tobacco comment: currently smoking less than a pack per day  Vaping Use  . Vaping Use: Never used  Substance and Sexual Activity  . Alcohol use: No  . Drug use: No  . Sexual activity: Not on file  Other Topics Concern  . Not on file  Social History Narrative   Lives in [place]; [place]; water treatment retd; son [48;[employer]]; smoke <1ppd; no alcohol. Rolling walker- since starting 2021 [back pain]    Social Determinants of Health   Financial Resource Strain:   . Difficulty of Paying Living Expenses: Not on file  Food Insecurity:   . Worried About Charity fundraiser in the Last Year: Not on file  . Ran Out of Food in the Last Year: Not on file  Transportation Needs:   . Lack of Transportation (Medical): Not on file  . Lack of Transportation (Non-Medical): Not on file  Physical Activity:   . Days of Exercise per Week: Not on file  . Minutes of Exercise per Session: Not on file  Stress:   . Feeling of Stress : Not on file  Social Connections:   . Frequency of Communication with Friends and Family: Not on file  . Frequency of Social Gatherings with Friends  and Family: Not on file  . Attends Religious Services: Not on file  . Active Member of Clubs or Organizations: Not on file  . Attends Archivist Meetings: Not on file  . Marital Status: Not on file  Intimate Partner Violence:   . Fear of Current or Ex-Partner: Not on file  . Emotionally Abused: Not on file  . Physically Abused: Not on file  . Sexually Abused: Not on file    FAMILY HISTORY: Family History  Problem Relation Age of Onset  . Crohn's disease Sister   . Breast cancer Neg Hx     ALLERGIES:  has No Known Allergies.  MEDICATIONS:  Current Outpatient Medications  Medication Sig Dispense Refill  . ADVAIR DISKUS 250-50 MCG/DOSE AEPB Inhale 1 puff into the lungs in the morning and at bedtime.    Marland Kitchen albuterol (PROVENTIL) (2.5 MG/3ML) 0.083% nebulizer solution Take 2.5 mg by nebulization every 6 (six) hours as needed for  wheezing or shortness of breath.     Marland Kitchen aspirin 81 MG EC tablet Take 81 mg by mouth daily. Swallow whole.    Marland Kitchen atorvastatin (LIPITOR) 80 MG tablet Take 80 mg by mouth at bedtime.     Marland Kitchen buPROPion (WELLBUTRIN XL) 150 MG 24 hr tablet Take 150 mg by mouth every morning.     . Cholecalciferol (VITAMIN D) 125 MCG (5000 UT) CAPS Take 5,000 Units by mouth daily.    . clopidogrel (PLAVIX) 75 MG tablet Take 75 mg by mouth daily.     . furosemide (LASIX) 20 MG tablet Take 20 mg by mouth 2 (two) times daily.     Marland Kitchen gabapentin (NEURONTIN) 300 MG capsule Take 300 mg by mouth in the morning and at bedtime.    Marland Kitchen glipiZIDE (GLUCOTROL) 5 MG tablet Take 5 mg by mouth daily before breakfast.     . INCRUSE ELLIPTA 62.5 MCG/INH AEPB Inhale 1 puff into the lungs daily.     . isosorbide mononitrate (IMDUR) 60 MG 24 hr tablet Take 60 mg by mouth every morning.     Marland Kitchen letrozole (FEMARA) 2.5 MG tablet Take 1 tablet (2.5 mg total) by mouth daily. Once a day. (Patient taking differently: Take 2.5 mg by mouth daily. ) 90 tablet 3  . levothyroxine (SYNTHROID, LEVOTHROID) 150 MCG tablet  Take 150 mcg by mouth daily before breakfast.     . metoprolol succinate (TOPROL-XL) 25 MG 24 hr tablet Take 25 mg by mouth daily.    Marland Kitchen omeprazole (PRILOSEC OTC) 20 MG tablet Take 20 mg by mouth every morning.    . pioglitazone (ACTOS) 45 MG tablet Take 45 mg by mouth daily.     . pramipexole (MIRAPEX) 0.25 MG tablet Take 0.25 mg by mouth at bedtime.      No current facility-administered medications for this visit.      Marland Kitchen  PHYSICAL EXAMINATION: ECOG PERFORMANCE STATUS: 0 - Asymptomatic  Vitals:   12/12/19 1426  BP: (!) 132/58  Pulse: 86  Resp: 20  Temp: 97.8 F (36.6 C)  SpO2: 96%   Filed Weights   12/12/19 1426  Weight: 216 lb (98 kg)    Physical Exam Constitutional:      Comments: Patient walking herself.   HENT:     Head: Normocephalic and atraumatic.     Mouth/Throat:     Pharynx: No oropharyngeal exudate.  Eyes:     Pupils: Pupils are equal, round, and reactive to light.  Cardiovascular:     Rate and Rhythm: Normal rate and regular rhythm.  Pulmonary:     Effort: Pulmonary effort is normal. No respiratory distress.     Breath sounds: Normal breath sounds. No wheezing.  Abdominal:     General: Bowel sounds are normal. There is no distension.     Palpations: Abdomen is soft. There is no mass.     Tenderness: There is no abdominal tenderness. There is no guarding or rebound.  Musculoskeletal:        General: No tenderness. Normal range of motion.     Cervical back: Normal range of motion and neck supple.  Skin:    General: Skin is warm.  Neurological:     Mental Status: She is alert and oriented to person, place, and time.  Psychiatric:        Mood and Affect: Affect normal.      LABORATORY DATA:  I have reviewed the data as listed Lab Results  Component Value Date  WBC 6.4 08/01/2019   HGB 13.6 08/01/2019   HCT 40.9 08/01/2019   MCV 97.6 08/01/2019   PLT 230 08/01/2019   Recent Labs    08/01/19 1016  NA 140  K 3.9  CL 100  CO2 28   GLUCOSE 204*  BUN 17  CREATININE 0.69  CALCIUM 9.0  GFRNONAA >60  GFRAA >60  PROT 7.3  ALBUMIN 3.9  AST 22  ALT 15  ALKPHOS 67  BILITOT 0.9    RADIOGRAPHIC STUDIES: I have personally reviewed the radiological images as listed and agreed with the findings in the report. No results found.  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 pT2 N1a.  S/p mastectomy- with positive margin- needing re-resection.  Final clear margins however monitoring issues. Oncotype RS- 20; risk of relapse is appx-17% in the next 9 years with endocrine therapy alone.  Poor candidate for chemotherapy/comorbidities neuropathy patient preference.  Continue letrozole.  #S/p mastectomy-unclear margins however monitoring issues.  Declines radiation oncology evaluation.   # screening for osteoporosis-wants BMD thru PCP.    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  # DISPOSITION: # Follow up in 4 months-MD; no labs- Dr.B  All questions were answered. The patient/family knows to call the clinic with any problems, questions or concerns.    Cammie Sickle, MD 12/30/2019 7:01 PM

## 2020-01-08 ENCOUNTER — Ambulatory Visit (INDEPENDENT_AMBULATORY_CARE_PROVIDER_SITE_OTHER): Payer: Medicare HMO | Admitting: Vascular Surgery

## 2020-01-08 ENCOUNTER — Encounter (INDEPENDENT_AMBULATORY_CARE_PROVIDER_SITE_OTHER): Payer: Medicare HMO

## 2020-02-08 ENCOUNTER — Ambulatory Visit (INDEPENDENT_AMBULATORY_CARE_PROVIDER_SITE_OTHER): Payer: Medicare HMO | Admitting: Vascular Surgery

## 2020-02-08 ENCOUNTER — Encounter (INDEPENDENT_AMBULATORY_CARE_PROVIDER_SITE_OTHER): Payer: Medicare HMO

## 2020-03-11 ENCOUNTER — Encounter (INDEPENDENT_AMBULATORY_CARE_PROVIDER_SITE_OTHER): Payer: Medicare HMO

## 2020-03-11 ENCOUNTER — Ambulatory Visit (INDEPENDENT_AMBULATORY_CARE_PROVIDER_SITE_OTHER): Payer: Medicare HMO | Admitting: Vascular Surgery

## 2020-03-20 ENCOUNTER — Other Ambulatory Visit: Payer: Self-pay | Admitting: Family Medicine

## 2020-04-16 ENCOUNTER — Ambulatory Visit: Payer: Medicare HMO | Admitting: Internal Medicine

## 2020-04-24 ENCOUNTER — Telehealth: Payer: Self-pay | Admitting: *Deleted

## 2020-04-24 NOTE — Telephone Encounter (Signed)
Patient contacted. Unable to leave vm on cell or home phone. Her mail box is full.  mychart message sent to patient to offer patient a virtual visit on 04/30/20 instead of face to face in office.

## 2020-04-30 ENCOUNTER — Inpatient Hospital Stay: Payer: Medicare HMO | Admitting: Internal Medicine

## 2020-05-01 ENCOUNTER — Other Ambulatory Visit: Payer: Self-pay | Admitting: *Deleted

## 2020-05-01 ENCOUNTER — Inpatient Hospital Stay: Payer: Medicare HMO | Attending: Internal Medicine | Admitting: Internal Medicine

## 2020-05-01 ENCOUNTER — Other Ambulatory Visit: Payer: Self-pay

## 2020-05-01 ENCOUNTER — Encounter: Payer: Self-pay | Admitting: Internal Medicine

## 2020-05-01 DIAGNOSIS — Z170 Estrogen receptor positive status [ER+]: Secondary | ICD-10-CM

## 2020-05-01 DIAGNOSIS — C50511 Malignant neoplasm of lower-outer quadrant of right female breast: Secondary | ICD-10-CM

## 2020-05-01 MED ORDER — LETROZOLE 2.5 MG PO TABS: 2.5000 mg | ORAL_TABLET | Freq: Every day | ORAL | 3 refills | Status: DC

## 2020-05-01 NOTE — Assessment & Plan Note (Addendum)
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 pT2 N1a.  S/p mastectomy- with positive margin- needing re-resection.  Final clear margins however wound healing issues. Oncotype RS- 20; risk of relapse is appx-17% in the next 9 years with endocrine therapy alone [ Poor candidate for chemotherapy/comorbidities neuropathy patient preference].   #Patient currently on letrozole tolerating fairly well.  Denies any adverse events. Continue letrozole; new refill sent.  Given high risk disease I discussed regarding use of letrozole for 10 years.   # screening for osteoporosis-wants BMD thru PCP.    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   # DISPOSITION: # Follow up in 6 months-MD;labs- cbc/cmp-Dr.B

## 2020-05-01 NOTE — Progress Notes (Signed)
I connected with Abree D Esty on 05/01/20 at  2:30 PM EST by video enabled telemedicine visit and verified that I am speaking with the correct person using two identifiers.  I discussed the limitations, risks, security and privacy concerns of performing an evaluation and management service by telemedicine and the availability of in-person appointments. I also discussed with the patient that there may be a patient responsible charge related to this service. The patient expressed understanding and agreed to proceed.    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  Recurrent score 20 ~risk of recurrence with endocrine therapy-17%; no chemotherapy; declines radiation evaluation.  #Femara.   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  female with history of breast cancer ER/PR positive HER2 negative T2N1-currently on adjuvant Femara is here for follow-up.  Patient states that she continues to follow-up with Dr. Bary Castilla for  her postmastectomy wound healing problems.  However currently healing well.  She states that she has an appointment with Dr. Bary Castilla in summer.  Denies any lumps or bumps.  Denies any nausea vomiting abdominal pain headache.  Denies any new onset of tingling numbness.  Denies any hot flashes.  Denies any worsening joint pains.  Observation/objective: No acute distress.  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 pT2 N1a.  S/p mastectomy- with positive margin- needing re-resection.  Final clear margins however wound healing issues. Oncotype RS- 20; risk of relapse is appx-17% in the next 9 years with endocrine therapy alone [ Poor candidate for chemotherapy/comorbidities neuropathy patient preference].   #Patient currently on letrozole tolerating fairly well.  Denies any adverse events. Continue letrozole; new refill sent.  Given high risk disease I discussed regarding use of letrozole for 10 years.   # screening for osteoporosis-wants BMD thru PCP.    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   # DISPOSITION: # Follow up in 6 months-MD;labs- cbc/cmp-Dr.B   Follow-up instructions:  I discussed the assessment and treatment plan with the patient.  The patient was provided an opportunity to ask questions and all were answered.  The patient agreed with the plan and demonstrated understanding of instructions.  The patient was advised to call back or seek an in person evaluation if the symptoms worsen or if the condition fails to improve as anticipated.  Dr. Charlaine Dalton [place] at [facility] 05/01/2020 2:28 PM

## 2020-06-19 ENCOUNTER — Telehealth: Payer: Self-pay | Admitting: *Deleted

## 2020-06-19 NOTE — Telephone Encounter (Signed)
Attempted to contact and schedule lung screening scan. Message left for patient to call back to schedule. 

## 2020-07-22 DIAGNOSIS — E782 Mixed hyperlipidemia: Secondary | ICD-10-CM | POA: Diagnosis not present

## 2020-07-22 DIAGNOSIS — G2581 Restless legs syndrome: Secondary | ICD-10-CM | POA: Diagnosis not present

## 2020-07-22 DIAGNOSIS — E118 Type 2 diabetes mellitus with unspecified complications: Secondary | ICD-10-CM | POA: Diagnosis not present

## 2020-07-22 DIAGNOSIS — E039 Hypothyroidism, unspecified: Secondary | ICD-10-CM | POA: Diagnosis not present

## 2020-07-22 DIAGNOSIS — Z1331 Encounter for screening for depression: Secondary | ICD-10-CM | POA: Diagnosis not present

## 2020-08-27 ENCOUNTER — Telehealth: Payer: Self-pay

## 2020-08-27 NOTE — Telephone Encounter (Signed)
Left message for patient to notify them that it is time to schedule annual low dose lung cancer screening CT scan. Instructed patient to call back [phone]) to schedule.

## 2020-09-23 DIAGNOSIS — Z853 Personal history of malignant neoplasm of breast: Secondary | ICD-10-CM | POA: Diagnosis not present

## 2020-10-30 ENCOUNTER — Inpatient Hospital Stay: Payer: Medicare HMO | Attending: Internal Medicine

## 2020-10-30 ENCOUNTER — Inpatient Hospital Stay: Payer: Medicare HMO | Admitting: Internal Medicine

## 2020-10-30 ENCOUNTER — Encounter: Payer: Self-pay | Admitting: Internal Medicine

## 2020-10-30 VITALS — BP 154/80 | HR 62 | Temp 98.0°F | Resp 22 | Ht 63.0 in | Wt 220.0 lb

## 2020-10-30 DIAGNOSIS — C50511 Malignant neoplasm of lower-outer quadrant of right female breast: Secondary | ICD-10-CM | POA: Diagnosis not present

## 2020-10-30 DIAGNOSIS — I890 Lymphedema, not elsewhere classified: Secondary | ICD-10-CM | POA: Diagnosis not present

## 2020-10-30 DIAGNOSIS — Z170 Estrogen receptor positive status [ER+]: Secondary | ICD-10-CM | POA: Diagnosis not present

## 2020-10-30 DIAGNOSIS — F17210 Nicotine dependence, cigarettes, uncomplicated: Secondary | ICD-10-CM | POA: Diagnosis not present

## 2020-10-30 LAB — CBC WITH DIFFERENTIAL/PLATELET
Abs Immature Granulocytes: 0.01 10*3/uL (ref 0.00–0.07)
Basophils Absolute: 0 10*3/uL (ref 0.0–0.1)
Basophils Relative: 1 %
Eosinophils Absolute: 0.2 10*3/uL (ref 0.0–0.5)
Eosinophils Relative: 4 %
HCT: 38.5 % (ref 36.0–46.0)
Hemoglobin: 12.3 g/dL (ref 12.0–15.0)
Immature Granulocytes: 0 %
Lymphocytes Relative: 19 %
Lymphs Abs: 1 10*3/uL (ref 0.7–4.0)
MCH: 31.8 pg (ref 26.0–34.0)
MCHC: 31.9 g/dL (ref 30.0–36.0)
MCV: 99.5 fL (ref 80.0–100.0)
Monocytes Absolute: 0.4 10*3/uL (ref 0.1–1.0)
Monocytes Relative: 8 %
Neutro Abs: 3.5 10*3/uL (ref 1.7–7.7)
Neutrophils Relative %: 68 %
Platelets: 240 10*3/uL (ref 150–400)
RBC: 3.87 MIL/uL (ref 3.87–5.11)
RDW: 17.4 % — ABNORMAL HIGH (ref 11.5–15.5)
WBC: 5.1 10*3/uL (ref 4.0–10.5)
nRBC: 0 % (ref 0.0–0.2)

## 2020-10-30 LAB — COMPREHENSIVE METABOLIC PANEL
ALT: 13 U/L (ref 0–44)
AST: 17 U/L (ref 15–41)
Albumin: 4 g/dL (ref 3.5–5.0)
Alkaline Phosphatase: 56 U/L (ref 38–126)
Anion gap: 8 (ref 5–15)
BUN: 18 mg/dL (ref 8–23)
CO2: 28 mmol/L (ref 22–32)
Calcium: 8.8 mg/dL — ABNORMAL LOW (ref 8.9–10.3)
Chloride: 104 mmol/L (ref 98–111)
Creatinine, Ser: 0.7 mg/dL (ref 0.44–1.00)
GFR, Estimated: >60 mL/min (ref >60)
Glucose, Bld: 242 mg/dL — ABNORMAL HIGH (ref 70–99)
Potassium: 3.8 mmol/L (ref 3.5–5.1)
Sodium: 140 mmol/L (ref 135–145)
Total Bilirubin: 0.5 mg/dL (ref 0.3–1.2)
Total Protein: 7 g/dL (ref 6.5–8.1)

## 2020-10-30 NOTE — Progress Notes (Signed)
[facility] [place] NOTE  Patient Care Team: Martin Majestic, FNP as PCP - General (Family Medicine) Bary Castilla, Forest Gleason, MD as Consulting Physician (General Surgery) Cammie Sickle, MD as Consulting Physician (Internal Medicine)  CHIEF COMPLAINTS/PURPOSE OF CONSULTATION: Breast cancer   Oncology History Overview Note  # MARCH 2021-invasive ductal ca-1.8 x.2.1 x2.5 s/p Bx- T2N1 [lymph node biopsy proven; [facility]] grade 3 -ER- 95%; PR-5%; her 2 Neu- NEG [Dr.Byrnett]; May 2021-s/p mastectomy-positive margin; needing re-resection; ;pT2; 1/15 lymph nodes positive.  Recurrent score 20 ~risk of recurrence with endocrine therapy-17%; no chemotherapy; declines radiation evaluation.  #Femara.   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  female right breast cancer ER/PR positive HER-2 negative currently on letrozole is here for follow-up.   Patient notes to have increasing swelling of the right upper extremity/arm and also right chest wall.  Denies any infection.  No chest pain or shortness of breath or cough.  Chronic mild joint pain/chronic back pain not any worse.  No further infectious complications from her mastectomy.  She continues to go around with a walker at baseline because of her neuropathy.  Review of Systems  Constitutional:  Positive for malaise/fatigue. Negative for chills,  diaphoresis, fever and weight loss.  HENT:  Negative for nosebleeds and sore throat.   Eyes:  Negative for double vision.  Respiratory:  Positive for shortness of breath. Negative for cough, hemoptysis, sputum production and wheezing.   Cardiovascular:  Negative for chest pain, palpitations, orthopnea and leg swelling.  Gastrointestinal:  Negative for abdominal pain, blood in stool, constipation, diarrhea, heartburn, melena, nausea and vomiting.  Genitourinary:  Negative for dysuria, frequency and urgency.  Musculoskeletal:  Positive for back pain and joint pain.  Skin: Negative.  Negative for itching and rash.  Neurological:  Positive for tingling. Negative for dizziness, focal weakness, weakness and headaches.  Endo/Heme/Allergies:  Does not bruise/bleed easily.  Psychiatric/Behavioral:  Negative for depression. The patient is not nervous/anxious and does not have insomnia.     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[place]) 03/31/2011   OSA on CPAP 04/02/2011   Peripheral vascular occlusive disease ([place])    occluded left internal carotid and moderate right ICAstenosis   Thoracic compression fracture ([place])     SURGICAL HISTORY: Past Surgical History:  Procedure Laterality Date   APPENDECTOMY     BREAST SURGERY     CARDIAC CATHETERIZATION  2010   recath revealing non-critica CAD   CARDIAC SURGERY     X3   carotid doppler  10/20/09   right bulb and ICA 50-69%,left CCA occluded   CHOLECYSTECTOMY     CORONARY ANGIOPLASTY  2009   s/p LAD AND CIRC STENT   LAPAROTOMY  04/07/2011   BOWEL OBSTRUCTIONProcedure:  EXPLORATORY LAPAROTOMY;  Surgeon: Donato Heinz, MD;  Location: [facility];  Service: General;  Laterality: N/A;   MASTECTOMY Left 2005   MASTECTOMY MODIFIED RADICAL Right 08/13/2019   Procedure: Modified Radical Mastectomy;  Surgeon: Robert Bellow, MD;  Location: [facility];  Service: General;  Laterality: Right;  right breast modified radical mastectomy   NM MYOCAR PERF WALL MOTION  July 02, 2010   entirely normal   THYROID SURGERY     TUBAL LIGATION     WOUND DEBRIDEMENT Right 09/28/2019   Procedure: DEBRIDEMENT WOUND RIGHT MASTECTOMY SITE;  Surgeon: Robert Bellow, MD;  Location: [facility] ORS;  Service: General;  Laterality: Right;    SOCIAL HISTORY: Social History   Socioeconomic History   Marital status: Divorced    Spouse name: Not on file   Number of children: Not on file   Years of education: Not on file   Highest education level: Not on file  Occupational History   Not on file  Tobacco Use   Smoking status: Every Day    Packs/day: 1.00    Years: 34.00    Pack years: 34.00    Types: Cigarettes   Smokeless tobacco: Never   Tobacco comments:    currently smoking less than a pack per day  Vaping Use   Vaping Use: Never used  Substance and Sexual Activity   Alcohol use: No   Drug use: No   Sexual activity: Not on file  Other Topics Concern   Not on file  Social History Narrative   Lives in [place]; [place]; water treatment retd; son [48;[place]]; smoke <1ppd; no alcohol. Rolling walker- since starting 2021 [back pain]    Social Determinants of Health   Financial Resource Strain: Not on file  Food Insecurity: Not on file  Transportation Needs: Not on file  Physical Activity: Not on file  Stress: Not on file  Social Connections: Not on file  Intimate Partner Violence: Not on file    FAMILY HISTORY: Family History  Problem Relation Age of Onset   Crohn's disease Sister    Breast cancer Neg Hx     ALLERGIES:  has No Known Allergies.  MEDICATIONS:   Current Outpatient Medications  Medication Sig Dispense Refill   alendronate (FOSAMAX) 70 MG tablet Take 1 tablet by mouth once a week.     aspirin 81 MG EC tablet Take 81 mg by mouth daily. Swallow whole.     atorvastatin (LIPITOR) 80 MG tablet Take 80 mg by mouth at bedtime.      buPROPion (WELLBUTRIN XL) 150 MG 24 hr tablet Take 150 mg by mouth every morning.      Cholecalciferol (VITAMIN D) 125 MCG (5000 UT) CAPS Take 5,000 Units by mouth daily.     clopidogrel (PLAVIX) 75 MG tablet Take 75 mg by mouth daily.      furosemide (LASIX) 20 MG tablet Take 20 mg by mouth 2 (two) times daily.      gabapentin (NEURONTIN) 300 MG capsule Take 300 mg by mouth in the morning and at bedtime.     INCRUSE ELLIPTA 62.5 MCG/INH AEPB Inhale 1 puff  into the lungs daily.      isosorbide mononitrate (IMDUR) 60 MG 24 hr tablet Take 60 mg by mouth every morning.      letrozole (FEMARA) 2.5 MG tablet Take 1 tablet (2.5 mg total) by mouth daily. Once a day. 90 tablet 3   levothyroxine (SYNTHROID, LEVOTHROID) 150 MCG tablet Take 150 mcg by mouth daily before breakfast.      metoprolol succinate (TOPROL-XL) 25 MG 24 hr tablet Take 25 mg by mouth daily.     omeprazole (PRILOSEC OTC) 20 MG tablet Take 20 mg by mouth every morning.     pioglitazone (ACTOS) 45 MG tablet Take 45 mg by mouth daily.      pramipexole (MIRAPEX) 0.25 MG tablet Take 0.25 mg by mouth at bedtime.      ADVAIR DISKUS 250-50 MCG/DOSE AEPB Inhale 1 puff into the lungs in the morning and at bedtime. (Patient not taking: Reported on 10/30/2020)     albuterol (PROVENTIL) (2.5 MG/3ML) 0.083% nebulizer solution Take 2.5 mg by nebulization every 6 (six) hours as needed for wheezing or shortness of breath.  (Patient not taking: Reported on 10/30/2020)     No current facility-administered medications for this visit.      Marland Kitchen  PHYSICAL EXAMINATION: ECOG PERFORMANCE STATUS: 0 - Asymptomatic  Vitals:   10/30/20 1024  BP: (!) 154/80  Pulse: 62  Resp:  (!) 22  Temp: 98 F (36.7 C)  SpO2: 98%   Filed Weights   10/30/20 1024  Weight: 220 lb (99.8 kg)    Physical Exam Constitutional:      Comments: Patient walking herself; ambulating with help of rolling walker.  HENT:     Head: Normocephalic and atraumatic.     Mouth/Throat:     Pharynx: No oropharyngeal exudate.  Eyes:     Pupils: Pupils are equal, round, and reactive to light.  Cardiovascular:     Rate and Rhythm: Normal rate and regular rhythm.  Pulmonary:     Effort: Pulmonary effort is normal. No respiratory distress.     Breath sounds: Normal breath sounds. No wheezing.  Abdominal:     General: Bowel sounds are normal. There is no distension.     Palpations: Abdomen is soft. There is no mass.     Tenderness: There is no abdominal tenderness. There is no guarding or rebound.  Musculoskeletal:        General: No tenderness. Normal range of motion.     Cervical back: Normal range of motion and neck supple.     Comments: Right arm/chest wall swollen compared to the left.  Skin:    General: Skin is warm.     Comments: Bilateral mastectomy incisions noted.-Well-healed.  No signs of infection.  Neurological:     Mental Status: She is alert and oriented to person, place, and time.  Psychiatric:        Mood and Affect: Affect normal.     LABORATORY DATA:  I have reviewed the data as listed Lab Results  Component Value Date   WBC 5.1 10/30/2020   HGB 12.3 10/30/2020   HCT 38.5 10/30/2020   MCV 99.5 10/30/2020   PLT 240 10/30/2020   Recent Labs    10/30/20 0947  NA 140  K 3.8  CL 104  CO2 28  GLUCOSE 242*  BUN 18  CREATININE 0.70  CALCIUM 8.8*  GFRNONAA >60  PROT 7.0  ALBUMIN 4.0  AST 17  ALT 13  ALKPHOS 56  BILITOT 0.5  RADIOGRAPHIC STUDIES: I have personally reviewed the radiological images as listed and agreed with the findings in the report. No results found.  ASSESSMENT & PLAN:   Carcinoma of lower-outer quadrant of right breast in  female, estrogen receptor positive ([place]) # Stage IIB- Right breast cancer-ER positive PR positive HER-2/neu negative; grade 3. pT2 N1a.  S/p mastectomy- with positive margin- needing re-resection.  Final clear margins however wound healing issues. Oncotype RS- 20; risk of relapse is appx-17% in the next 9 years with endocrine therapy alone [ Poor candidate for chemotherapy/comorbidities neuropathy patient preference]. No RT  #Patient currently on letrozole tolerating fairly well. Continue letrozole; new refill sent.  Given high risk disease I discussed regarding use of letrozole for 10 years. Will need refill nin October 2022.   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 The patient/family knows to call the clinic with any problems, questions or concerns.    Cammie Sickle, MD 11/02/2020 5:41 PM

## 2020-10-30 NOTE — Assessment & Plan Note (Addendum)
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 pT2 N1a.  S/p mastectomy- with positive margin- needing re-resection.  Final clear margins however wound healing issues. Oncotype RS- 20; risk of relapse is appx-17% in the next 9 years with endocrine therapy alone [ Poor candidate for chemotherapy/comorbidities neuropathy patient preference]. No RT  #Patient currently on letrozole tolerating fairly well. Continue letrozole; new refill sent.  Given high risk disease I discussed regarding use of letrozole for 10 years. Will need refill in October 2022.   # right arm/ chest wall swelling: sec to lymphedema-recommend evaluation with physical therapy/Occupational Therapy.  We will make a referral.  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

## 2020-10-30 NOTE — Progress Notes (Signed)
Survivorship Care Plan visit completed.  Treatment summary reviewed and given to patient.  ASCO answers booklet reviewed and given to patient.  CARE program and Cancer Transitions discussed with patient along with other resources cancer center offers to patients and caregivers.  Patient verbalized understanding.    

## 2020-11-05 ENCOUNTER — Inpatient Hospital Stay: Payer: Medicare HMO | Admitting: Occupational Therapy

## 2020-11-05 DIAGNOSIS — I890 Lymphedema, not elsewhere classified: Secondary | ICD-10-CM

## 2020-11-05 NOTE — Therapy (Signed)
[facility] [address] [place], [place], [place], [address] Phone: [phone]   Fax:  [phone]  Occupational Therapy Evaluation  Patient Details  Name: Michaela Wilcox MRN: [id number] Date of Birth: 13-Feb-1951 No data recorded  Encounter Date: 11/05/2020   OT End of Session - 11/05/20 1806     Visit Number 0             Past Medical History:  Diagnosis Date   Anginal pain ([place])    Arthritis    Breast cancer ([place]) 2005   left   Cancer ([place]) 7/05   breast; mastectomy, chemo and radiation   Carotid artery disease ([place])    Coronary artery disease    Diabetes mellitus    Emphysema lung ([place])    GERD (gastroesophageal reflux disease)    High cholesterol    on statin   Hypertension    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 ([place]) 03/31/2011   OSA on CPAP 04/02/2011   Peripheral vascular occlusive disease ([place])    occluded left internal carotid and moderate right ICAstenosis   Thoracic compression fracture [facility])     Past Surgical History:  Procedure Laterality Date   APPENDECTOMY     BREAST SURGERY     CARDIAC CATHETERIZATION  2010   recath revealing non-critica CAD   CARDIAC SURGERY     X3   carotid doppler  10/20/09   right bulb and ICA 50-69%,left CCA occluded   CHOLECYSTECTOMY     CORONARY ANGIOPLASTY  2009   s/p LAD AND CIRC STENT   LAPAROTOMY  04/07/2011   BOWEL OBSTRUCTIONProcedure: EXPLORATORY LAPAROTOMY;  Surgeon: Donato Heinz, MD;  Location: [facility];  Service: General;  Laterality: N/A;   MASTECTOMY Left 2005   MASTECTOMY MODIFIED RADICAL Right 08/13/2019   Procedure: Modified Radical Mastectomy;  Surgeon: Robert Bellow, MD;  Location: [facility];  Service: General;  Laterality: Right;  right breast modified radical mastectomy   NM MYOCAR PERF WALL MOTION  July 02, 2010   entirely normal   THYROID SURGERY     TUBAL LIGATION     WOUND DEBRIDEMENT Right 09/28/2019   Procedure: DEBRIDEMENT WOUND RIGHT  MASTECTOMY SITE;  Surgeon: Robert Bellow, MD;  Location: [facility] ORS;  Service: General;  Laterality: Right;    There were no vitals filed for this visit.   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 pT2 N1a.  S/p mastectomy- with positive margin- needing re-resection.  Final clear margins however wound healing issues. Oncotype RS- 20; risk of relapse is appx-17% in the next 9 years with endocrine therapy alone [ Poor candidate for chemotherapy/comorbidities  neuropathy patient preference]. No RT   #Patient currently on letrozole tolerating fairly well. Continue letrozole; new refill sent.  Given high risk disease I discussed regarding use of letrozole for 10 years. Will need refill nin October 2022.   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- STABLE       # DISPOSITION: # referral to Lymphedema clinic re: right UE Lymphedema # Follow up in 6 months-MD;labs- cbc/cmp-Dr.B  OT SCREEN 11/05/20:  Pt report she had lymphedema and still have in her L UE -and used pump in the past and still about 2 x wk -maybe but it is about 71 yrs old - was fitted with thick sleeve then and do not wear it for about 3 yrs  L UE lymphedema started about 6  months ago - and chest wall - her mastectomy incision had delay healing and skin graft  Measurements taken - elbow and forearm to wrist increase in L compare to R and more fibrosis in volar forearm on the L  Pt fitted with Tubigrip D to use from hands to elbow - and start using her pump am and pm - and  OT will contact Rept to fit pt with new pump to use and chest piece for L chest wall lymphedema  And check on sleeve that is between over the counter thin sleeve and custom Jobst Elvarex soft sleeve and glove  Would need to take compliance in consideration but also containment  Unilateral post mastectomy jovipak breast pad for L chest wall  Would recommend OT eval and tx - will ask order from Dr Rogue Bussing -and contact all Reps for pump                                  Patient will benefit from skilled therapeutic intervention in order to improve the following deficits and impairments:           Visit Diagnosis: Lymphedema of upper extremity, bilateral    Problem List Patient Active Problem List   Diagnosis Date Noted   Neoplasm of uncertain behavior of lower outer quadrant of right female breast 07/20/2019   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, estrogen receptor positive ([place]) 07/20/2019   Peripheral edema 08/10/2018   Presence of stent in LAD coronary artery 07/19/2018   Shortness of breath 07/19/2018   Tobacco use disorder 10/14/2017   Carotid artery disease ([place]) 12/14/2012   Hyperlipidemia 12/14/2012   OSA on CPAP 04/02/2011   Hypokalemia 04/01/2011   SBO (small bowel obstruction) ([place]) 03/31/2011   CAD (coronary artery disease) 03/31/2011   DM type 2 (diabetes mellitus, type 2) ([place]) 03/31/2011   HTN (hypertension) 03/31/2011   History of breast cancer 03/31/2011   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 ([place]) 03/31/2011   Disorder of bone and cartilage 08/28/2009   Chronic airway obstruction, not elsewhere classified 08/25/2009    Hypothyroidism 08/25/2009   Chest pain, unspecified 07/21/2009   Onychomycosis due to dermatophyte 07/21/2009   Acute upper respiratory infection 05/26/2009   Atherosclerotic heart disease of native coronary artery without angina pectoris 05/26/2009   Vitamin D deficiency 05/26/2009   Occlusion and stenosis of carotid artery 05/05/2009   Other bursitis disorders 05/05/2009   Ulcer of esophagus without bleeding 03/24/2009   Contact dermatitis and other  eczema, due to unspecified cause 07/27/2007    Rosalyn Gess OTR/L,CLT 11/05/2020, 6:06 PM  [facility] [address] [place], [place], [place], [address] Phone: [phone]   Fax:  [phone]  Name: Michaela Wilcox MRN: [id number] Date of Birth: [birth year]

## 2020-11-11 DIAGNOSIS — F17210 Nicotine dependence, cigarettes, uncomplicated: Secondary | ICD-10-CM | POA: Diagnosis not present

## 2020-11-11 DIAGNOSIS — E782 Mixed hyperlipidemia: Secondary | ICD-10-CM | POA: Diagnosis not present

## 2020-11-11 DIAGNOSIS — M810 Age-related osteoporosis without current pathological fracture: Secondary | ICD-10-CM | POA: Diagnosis not present

## 2020-11-11 DIAGNOSIS — E118 Type 2 diabetes mellitus with unspecified complications: Secondary | ICD-10-CM | POA: Diagnosis not present

## 2021-02-10 DIAGNOSIS — E782 Mixed hyperlipidemia: Secondary | ICD-10-CM | POA: Diagnosis not present

## 2021-02-10 DIAGNOSIS — M810 Age-related osteoporosis without current pathological fracture: Secondary | ICD-10-CM | POA: Diagnosis not present

## 2021-02-10 DIAGNOSIS — E118 Type 2 diabetes mellitus with unspecified complications: Secondary | ICD-10-CM | POA: Diagnosis not present

## 2021-02-10 DIAGNOSIS — Z6836 Body mass index (BMI) 36.0-36.9, adult: Secondary | ICD-10-CM | POA: Diagnosis not present

## 2021-04-23 ENCOUNTER — Other Ambulatory Visit: Payer: Self-pay | Admitting: *Deleted

## 2021-04-23 DIAGNOSIS — Z170 Estrogen receptor positive status [ER+]: Secondary | ICD-10-CM

## 2021-04-23 DIAGNOSIS — C50511 Malignant neoplasm of lower-outer quadrant of right female breast: Secondary | ICD-10-CM

## 2021-04-30 ENCOUNTER — Inpatient Hospital Stay: Payer: Medicare HMO | Admitting: Internal Medicine

## 2021-04-30 ENCOUNTER — Inpatient Hospital Stay: Payer: Medicare HMO | Attending: Internal Medicine

## 2021-05-12 DIAGNOSIS — E118 Type 2 diabetes mellitus with unspecified complications: Secondary | ICD-10-CM | POA: Diagnosis not present

## 2021-05-12 DIAGNOSIS — E039 Hypothyroidism, unspecified: Secondary | ICD-10-CM | POA: Diagnosis not present

## 2021-05-12 DIAGNOSIS — E782 Mixed hyperlipidemia: Secondary | ICD-10-CM | POA: Diagnosis not present

## 2021-05-12 DIAGNOSIS — I889 Nonspecific lymphadenitis, unspecified: Secondary | ICD-10-CM | POA: Diagnosis not present

## 2021-05-12 DIAGNOSIS — Z0000 Encounter for general adult medical examination without abnormal findings: Secondary | ICD-10-CM | POA: Diagnosis not present

## 2021-05-12 DIAGNOSIS — Z6836 Body mass index (BMI) 36.0-36.9, adult: Secondary | ICD-10-CM | POA: Diagnosis not present

## 2021-05-12 DIAGNOSIS — M810 Age-related osteoporosis without current pathological fracture: Secondary | ICD-10-CM | POA: Diagnosis not present

## 2021-05-14 DIAGNOSIS — Z6836 Body mass index (BMI) 36.0-36.9, adult: Secondary | ICD-10-CM | POA: Diagnosis not present

## 2021-05-14 DIAGNOSIS — I889 Nonspecific lymphadenitis, unspecified: Secondary | ICD-10-CM | POA: Diagnosis not present

## 2021-05-22 DIAGNOSIS — C50911 Malignant neoplasm of unspecified site of right female breast: Secondary | ICD-10-CM | POA: Diagnosis not present

## 2021-05-22 DIAGNOSIS — E8940 Asymptomatic postprocedural ovarian failure: Secondary | ICD-10-CM | POA: Diagnosis not present

## 2021-06-24 DIAGNOSIS — C50911 Malignant neoplasm of unspecified site of right female breast: Secondary | ICD-10-CM | POA: Diagnosis not present

## 2021-06-30 DIAGNOSIS — I890 Lymphedema, not elsewhere classified: Secondary | ICD-10-CM | POA: Diagnosis not present

## 2021-06-30 DIAGNOSIS — E8940 Asymptomatic postprocedural ovarian failure: Secondary | ICD-10-CM | POA: Diagnosis not present

## 2021-06-30 DIAGNOSIS — C50911 Malignant neoplasm of unspecified site of right female breast: Secondary | ICD-10-CM | POA: Diagnosis not present

## 2021-07-02 DIAGNOSIS — C50911 Malignant neoplasm of unspecified site of right female breast: Secondary | ICD-10-CM | POA: Diagnosis not present

## 2021-07-02 DIAGNOSIS — E8940 Asymptomatic postprocedural ovarian failure: Secondary | ICD-10-CM | POA: Diagnosis not present

## 2021-07-02 DIAGNOSIS — I890 Lymphedema, not elsewhere classified: Secondary | ICD-10-CM | POA: Diagnosis not present

## 2021-07-06 DIAGNOSIS — I890 Lymphedema, not elsewhere classified: Secondary | ICD-10-CM | POA: Diagnosis not present

## 2021-07-07 DIAGNOSIS — I890 Lymphedema, not elsewhere classified: Secondary | ICD-10-CM | POA: Diagnosis not present

## 2021-07-07 DIAGNOSIS — C50911 Malignant neoplasm of unspecified site of right female breast: Secondary | ICD-10-CM | POA: Diagnosis not present

## 2021-07-07 DIAGNOSIS — E8940 Asymptomatic postprocedural ovarian failure: Secondary | ICD-10-CM | POA: Diagnosis not present

## 2021-07-09 DIAGNOSIS — I890 Lymphedema, not elsewhere classified: Secondary | ICD-10-CM | POA: Diagnosis not present

## 2021-07-09 DIAGNOSIS — E8940 Asymptomatic postprocedural ovarian failure: Secondary | ICD-10-CM | POA: Diagnosis not present

## 2021-07-09 DIAGNOSIS — C50911 Malignant neoplasm of unspecified site of right female breast: Secondary | ICD-10-CM | POA: Diagnosis not present

## 2021-07-14 DIAGNOSIS — E8940 Asymptomatic postprocedural ovarian failure: Secondary | ICD-10-CM | POA: Diagnosis not present

## 2021-07-14 DIAGNOSIS — C50911 Malignant neoplasm of unspecified site of right female breast: Secondary | ICD-10-CM | POA: Diagnosis not present

## 2021-07-14 DIAGNOSIS — I890 Lymphedema, not elsewhere classified: Secondary | ICD-10-CM | POA: Diagnosis not present

## 2021-07-20 DIAGNOSIS — I890 Lymphedema, not elsewhere classified: Secondary | ICD-10-CM | POA: Diagnosis not present

## 2021-07-20 DIAGNOSIS — E8940 Asymptomatic postprocedural ovarian failure: Secondary | ICD-10-CM | POA: Diagnosis not present

## 2021-07-20 DIAGNOSIS — C50911 Malignant neoplasm of unspecified site of right female breast: Secondary | ICD-10-CM | POA: Diagnosis not present

## 2021-07-30 DIAGNOSIS — C50911 Malignant neoplasm of unspecified site of right female breast: Secondary | ICD-10-CM | POA: Diagnosis not present

## 2021-07-30 DIAGNOSIS — E8940 Asymptomatic postprocedural ovarian failure: Secondary | ICD-10-CM | POA: Diagnosis not present

## 2021-07-30 DIAGNOSIS — I890 Lymphedema, not elsewhere classified: Secondary | ICD-10-CM | POA: Diagnosis not present

## 2021-08-05 DIAGNOSIS — C50911 Malignant neoplasm of unspecified site of right female breast: Secondary | ICD-10-CM | POA: Diagnosis not present

## 2021-08-05 DIAGNOSIS — I890 Lymphedema, not elsewhere classified: Secondary | ICD-10-CM | POA: Diagnosis not present

## 2021-08-05 DIAGNOSIS — E8940 Asymptomatic postprocedural ovarian failure: Secondary | ICD-10-CM | POA: Diagnosis not present

## 2021-08-12 DIAGNOSIS — M810 Age-related osteoporosis without current pathological fracture: Secondary | ICD-10-CM | POA: Diagnosis not present

## 2021-08-12 DIAGNOSIS — I890 Lymphedema, not elsewhere classified: Secondary | ICD-10-CM | POA: Diagnosis not present

## 2021-08-12 DIAGNOSIS — E782 Mixed hyperlipidemia: Secondary | ICD-10-CM | POA: Diagnosis not present

## 2021-08-12 DIAGNOSIS — F32A Depression, unspecified: Secondary | ICD-10-CM | POA: Diagnosis not present

## 2021-08-12 DIAGNOSIS — E039 Hypothyroidism, unspecified: Secondary | ICD-10-CM | POA: Diagnosis not present

## 2021-08-12 DIAGNOSIS — R600 Localized edema: Secondary | ICD-10-CM | POA: Diagnosis not present

## 2021-08-12 DIAGNOSIS — E118 Type 2 diabetes mellitus with unspecified complications: Secondary | ICD-10-CM | POA: Diagnosis not present

## 2021-09-09 DIAGNOSIS — Z6834 Body mass index (BMI) 34.0-34.9, adult: Secondary | ICD-10-CM | POA: Diagnosis not present

## 2021-09-09 DIAGNOSIS — I10 Essential (primary) hypertension: Secondary | ICD-10-CM | POA: Diagnosis not present

## 2021-09-09 DIAGNOSIS — R809 Proteinuria, unspecified: Secondary | ICD-10-CM | POA: Diagnosis not present

## 2021-09-11 DIAGNOSIS — I972 Postmastectomy lymphedema syndrome: Secondary | ICD-10-CM | POA: Diagnosis not present

## 2021-09-11 DIAGNOSIS — E118 Type 2 diabetes mellitus with unspecified complications: Secondary | ICD-10-CM | POA: Diagnosis not present

## 2021-09-11 DIAGNOSIS — Z9013 Acquired absence of bilateral breasts and nipples: Secondary | ICD-10-CM | POA: Diagnosis not present

## 2021-10-19 DIAGNOSIS — E8940 Asymptomatic postprocedural ovarian failure: Secondary | ICD-10-CM | POA: Diagnosis not present

## 2021-10-19 DIAGNOSIS — C50911 Malignant neoplasm of unspecified site of right female breast: Secondary | ICD-10-CM | POA: Diagnosis not present

## 2021-10-20 DIAGNOSIS — S63502A Unspecified sprain of left wrist, initial encounter: Secondary | ICD-10-CM | POA: Diagnosis not present

## 2021-11-11 DIAGNOSIS — I69354 Hemiplegia and hemiparesis following cerebral infarction affecting left non-dominant side: Secondary | ICD-10-CM | POA: Diagnosis not present

## 2021-11-11 DIAGNOSIS — I679 Cerebrovascular disease, unspecified: Secondary | ICD-10-CM | POA: Diagnosis not present

## 2021-11-11 DIAGNOSIS — I69392 Facial weakness following cerebral infarction: Secondary | ICD-10-CM | POA: Diagnosis not present

## 2021-11-11 DIAGNOSIS — Z7982 Long term (current) use of aspirin: Secondary | ICD-10-CM | POA: Diagnosis not present

## 2021-11-11 DIAGNOSIS — G620 Drug-induced polyneuropathy: Secondary | ICD-10-CM | POA: Diagnosis not present

## 2021-11-11 DIAGNOSIS — R079 Chest pain, unspecified: Secondary | ICD-10-CM | POA: Diagnosis not present

## 2021-11-11 DIAGNOSIS — G4733 Obstructive sleep apnea (adult) (pediatric): Secondary | ICD-10-CM | POA: Diagnosis not present

## 2021-11-11 DIAGNOSIS — R531 Weakness: Secondary | ICD-10-CM | POA: Diagnosis not present

## 2021-11-11 DIAGNOSIS — I6381 Other cerebral infarction due to occlusion or stenosis of small artery: Secondary | ICD-10-CM | POA: Diagnosis not present

## 2021-11-11 DIAGNOSIS — E039 Hypothyroidism, unspecified: Secondary | ICD-10-CM | POA: Diagnosis not present

## 2021-11-11 DIAGNOSIS — Z853 Personal history of malignant neoplasm of breast: Secondary | ICD-10-CM | POA: Diagnosis not present

## 2021-11-11 DIAGNOSIS — R778 Other specified abnormalities of plasma proteins: Secondary | ICD-10-CM | POA: Diagnosis not present

## 2021-11-11 DIAGNOSIS — Z79899 Other long term (current) drug therapy: Secondary | ICD-10-CM | POA: Diagnosis not present

## 2021-11-11 DIAGNOSIS — R296 Repeated falls: Secondary | ICD-10-CM | POA: Diagnosis not present

## 2021-11-11 DIAGNOSIS — Z8679 Personal history of other diseases of the circulatory system: Secondary | ICD-10-CM | POA: Diagnosis not present

## 2021-11-11 DIAGNOSIS — E119 Type 2 diabetes mellitus without complications: Secondary | ICD-10-CM | POA: Diagnosis not present

## 2021-11-11 DIAGNOSIS — R29810 Facial weakness: Secondary | ICD-10-CM | POA: Diagnosis not present

## 2021-11-11 DIAGNOSIS — R29898 Other symptoms and signs involving the musculoskeletal system: Secondary | ICD-10-CM | POA: Diagnosis not present

## 2021-11-11 DIAGNOSIS — I639 Cerebral infarction, unspecified: Secondary | ICD-10-CM | POA: Diagnosis not present

## 2021-11-12 DIAGNOSIS — H269 Unspecified cataract: Secondary | ICD-10-CM | POA: Diagnosis not present

## 2021-11-12 DIAGNOSIS — I69354 Hemiplegia and hemiparesis following cerebral infarction affecting left non-dominant side: Secondary | ICD-10-CM | POA: Diagnosis not present

## 2021-11-12 DIAGNOSIS — I6389 Other cerebral infarction: Secondary | ICD-10-CM | POA: Diagnosis not present

## 2021-11-12 DIAGNOSIS — E876 Hypokalemia: Secondary | ICD-10-CM | POA: Diagnosis not present

## 2021-11-12 DIAGNOSIS — Z853 Personal history of malignant neoplasm of breast: Secondary | ICD-10-CM | POA: Diagnosis not present

## 2021-11-12 DIAGNOSIS — I69952 Hemiplegia and hemiparesis following unspecified cerebrovascular disease affecting left dominant side: Secondary | ICD-10-CM | POA: Diagnosis not present

## 2021-11-12 DIAGNOSIS — Z6841 Body Mass Index (BMI) 40.0 and over, adult: Secondary | ICD-10-CM | POA: Diagnosis not present

## 2021-11-12 DIAGNOSIS — I6381 Other cerebral infarction due to occlusion or stenosis of small artery: Secondary | ICD-10-CM | POA: Diagnosis not present

## 2021-11-12 DIAGNOSIS — I6522 Occlusion and stenosis of left carotid artery: Secondary | ICD-10-CM | POA: Diagnosis not present

## 2021-11-12 DIAGNOSIS — I10 Essential (primary) hypertension: Secondary | ICD-10-CM | POA: Diagnosis not present

## 2021-11-12 DIAGNOSIS — Z8679 Personal history of other diseases of the circulatory system: Secondary | ICD-10-CM | POA: Diagnosis not present

## 2021-11-12 DIAGNOSIS — I63412 Cerebral infarction due to embolism of left middle cerebral artery: Secondary | ICD-10-CM | POA: Diagnosis not present

## 2021-11-12 DIAGNOSIS — K5790 Diverticulosis of intestine, part unspecified, without perforation or abscess without bleeding: Secondary | ICD-10-CM | POA: Diagnosis not present

## 2021-11-12 DIAGNOSIS — F17200 Nicotine dependence, unspecified, uncomplicated: Secondary | ICD-10-CM | POA: Diagnosis not present

## 2021-11-12 DIAGNOSIS — I2510 Atherosclerotic heart disease of native coronary artery without angina pectoris: Secondary | ICD-10-CM | POA: Diagnosis not present

## 2021-11-12 DIAGNOSIS — I639 Cerebral infarction, unspecified: Secondary | ICD-10-CM | POA: Diagnosis not present

## 2021-11-12 DIAGNOSIS — E039 Hypothyroidism, unspecified: Secondary | ICD-10-CM | POA: Diagnosis not present

## 2021-11-12 DIAGNOSIS — R41841 Cognitive communication deficit: Secondary | ICD-10-CM | POA: Diagnosis not present

## 2021-11-12 DIAGNOSIS — R29810 Facial weakness: Secondary | ICD-10-CM | POA: Diagnosis not present

## 2021-11-12 DIAGNOSIS — R778 Other specified abnormalities of plasma proteins: Secondary | ICD-10-CM | POA: Diagnosis not present

## 2021-11-12 DIAGNOSIS — E785 Hyperlipidemia, unspecified: Secondary | ICD-10-CM | POA: Diagnosis not present

## 2021-11-12 DIAGNOSIS — Z955 Presence of coronary angioplasty implant and graft: Secondary | ICD-10-CM | POA: Diagnosis not present

## 2021-11-12 DIAGNOSIS — R296 Repeated falls: Secondary | ICD-10-CM | POA: Diagnosis not present

## 2021-11-12 DIAGNOSIS — E871 Hypo-osmolality and hyponatremia: Secondary | ICD-10-CM | POA: Diagnosis not present

## 2021-11-12 DIAGNOSIS — Z7982 Long term (current) use of aspirin: Secondary | ICD-10-CM | POA: Diagnosis not present

## 2021-11-12 DIAGNOSIS — Z803 Family history of malignant neoplasm of breast: Secondary | ICD-10-CM | POA: Diagnosis not present

## 2021-11-12 DIAGNOSIS — K219 Gastro-esophageal reflux disease without esophagitis: Secondary | ICD-10-CM | POA: Diagnosis not present

## 2021-11-12 DIAGNOSIS — Z7984 Long term (current) use of oral hypoglycemic drugs: Secondary | ICD-10-CM | POA: Diagnosis not present

## 2021-11-12 DIAGNOSIS — E1140 Type 2 diabetes mellitus with diabetic neuropathy, unspecified: Secondary | ICD-10-CM | POA: Diagnosis not present

## 2021-11-12 DIAGNOSIS — I6523 Occlusion and stenosis of bilateral carotid arteries: Secondary | ICD-10-CM | POA: Diagnosis not present

## 2021-11-12 DIAGNOSIS — I779 Disorder of arteries and arterioles, unspecified: Secondary | ICD-10-CM | POA: Diagnosis not present

## 2021-11-12 DIAGNOSIS — J449 Chronic obstructive pulmonary disease, unspecified: Secondary | ICD-10-CM | POA: Diagnosis not present

## 2021-11-12 DIAGNOSIS — R29705 NIHSS score 5: Secondary | ICD-10-CM | POA: Diagnosis not present

## 2021-11-12 DIAGNOSIS — R079 Chest pain, unspecified: Secondary | ICD-10-CM | POA: Diagnosis not present

## 2021-11-12 DIAGNOSIS — R531 Weakness: Secondary | ICD-10-CM | POA: Diagnosis not present

## 2021-11-12 DIAGNOSIS — T451X5A Adverse effect of antineoplastic and immunosuppressive drugs, initial encounter: Secondary | ICD-10-CM | POA: Diagnosis not present

## 2021-11-12 DIAGNOSIS — I771 Stricture of artery: Secondary | ICD-10-CM | POA: Diagnosis not present

## 2021-11-12 DIAGNOSIS — E890 Postprocedural hypothyroidism: Secondary | ICD-10-CM | POA: Diagnosis not present

## 2021-11-12 DIAGNOSIS — Z9989 Dependence on other enabling machines and devices: Secondary | ICD-10-CM | POA: Diagnosis not present

## 2021-11-12 DIAGNOSIS — E119 Type 2 diabetes mellitus without complications: Secondary | ICD-10-CM | POA: Diagnosis not present

## 2021-11-12 DIAGNOSIS — E7800 Pure hypercholesterolemia, unspecified: Secondary | ICD-10-CM | POA: Diagnosis not present

## 2021-11-12 DIAGNOSIS — G4733 Obstructive sleep apnea (adult) (pediatric): Secondary | ICD-10-CM | POA: Diagnosis not present

## 2021-11-12 DIAGNOSIS — F17210 Nicotine dependence, cigarettes, uncomplicated: Secondary | ICD-10-CM | POA: Diagnosis not present

## 2021-11-12 DIAGNOSIS — I679 Cerebrovascular disease, unspecified: Secondary | ICD-10-CM | POA: Diagnosis not present

## 2021-11-12 DIAGNOSIS — I63531 Cerebral infarction due to unspecified occlusion or stenosis of right posterior cerebral artery: Secondary | ICD-10-CM | POA: Diagnosis not present

## 2021-11-12 DIAGNOSIS — Z9181 History of falling: Secondary | ICD-10-CM | POA: Diagnosis not present

## 2021-11-12 DIAGNOSIS — R29898 Other symptoms and signs involving the musculoskeletal system: Secondary | ICD-10-CM | POA: Diagnosis not present

## 2021-11-12 DIAGNOSIS — Z8673 Personal history of transient ischemic attack (TIA), and cerebral infarction without residual deficits: Secondary | ICD-10-CM | POA: Diagnosis not present

## 2021-11-12 DIAGNOSIS — I352 Nonrheumatic aortic (valve) stenosis with insufficiency: Secondary | ICD-10-CM | POA: Diagnosis not present

## 2021-11-12 DIAGNOSIS — R2689 Other abnormalities of gait and mobility: Secondary | ICD-10-CM | POA: Diagnosis not present

## 2021-11-12 DIAGNOSIS — Z79811 Long term (current) use of aromatase inhibitors: Secondary | ICD-10-CM | POA: Diagnosis not present

## 2021-11-12 DIAGNOSIS — G620 Drug-induced polyneuropathy: Secondary | ICD-10-CM | POA: Diagnosis not present

## 2021-11-12 DIAGNOSIS — M6281 Muscle weakness (generalized): Secondary | ICD-10-CM | POA: Diagnosis not present

## 2021-11-12 DIAGNOSIS — Z79899 Other long term (current) drug therapy: Secondary | ICD-10-CM | POA: Diagnosis not present

## 2021-11-12 DIAGNOSIS — E1143 Type 2 diabetes mellitus with diabetic autonomic (poly)neuropathy: Secondary | ICD-10-CM | POA: Diagnosis not present

## 2021-11-12 DIAGNOSIS — C50919 Malignant neoplasm of unspecified site of unspecified female breast: Secondary | ICD-10-CM | POA: Diagnosis not present

## 2021-11-17 DIAGNOSIS — I63339 Cerebral infarction due to thrombosis of unspecified posterior cerebral artery: Secondary | ICD-10-CM | POA: Diagnosis not present

## 2021-11-17 DIAGNOSIS — F17210 Nicotine dependence, cigarettes, uncomplicated: Secondary | ICD-10-CM | POA: Diagnosis not present

## 2021-11-17 DIAGNOSIS — I779 Disorder of arteries and arterioles, unspecified: Secondary | ICD-10-CM | POA: Diagnosis not present

## 2021-11-17 DIAGNOSIS — F4320 Adjustment disorder, unspecified: Secondary | ICD-10-CM | POA: Diagnosis not present

## 2021-11-17 DIAGNOSIS — E119 Type 2 diabetes mellitus without complications: Secondary | ICD-10-CM | POA: Diagnosis not present

## 2021-11-17 DIAGNOSIS — Z9989 Dependence on other enabling machines and devices: Secondary | ICD-10-CM | POA: Diagnosis not present

## 2021-11-17 DIAGNOSIS — Z8673 Personal history of transient ischemic attack (TIA), and cerebral infarction without residual deficits: Secondary | ICD-10-CM | POA: Diagnosis not present

## 2021-11-17 DIAGNOSIS — I69952 Hemiplegia and hemiparesis following unspecified cerebrovascular disease affecting left dominant side: Secondary | ICD-10-CM | POA: Diagnosis not present

## 2021-11-17 DIAGNOSIS — F32A Depression, unspecified: Secondary | ICD-10-CM | POA: Diagnosis not present

## 2021-11-17 DIAGNOSIS — R2689 Other abnormalities of gait and mobility: Secondary | ICD-10-CM | POA: Diagnosis not present

## 2021-11-17 DIAGNOSIS — G620 Drug-induced polyneuropathy: Secondary | ICD-10-CM | POA: Diagnosis not present

## 2021-11-17 DIAGNOSIS — F17200 Nicotine dependence, unspecified, uncomplicated: Secondary | ICD-10-CM | POA: Diagnosis not present

## 2021-11-17 DIAGNOSIS — Z955 Presence of coronary angioplasty implant and graft: Secondary | ICD-10-CM | POA: Diagnosis not present

## 2021-11-17 DIAGNOSIS — E785 Hyperlipidemia, unspecified: Secondary | ICD-10-CM | POA: Diagnosis not present

## 2021-11-17 DIAGNOSIS — I69354 Hemiplegia and hemiparesis following cerebral infarction affecting left non-dominant side: Secondary | ICD-10-CM | POA: Diagnosis not present

## 2021-11-17 DIAGNOSIS — R41841 Cognitive communication deficit: Secondary | ICD-10-CM | POA: Diagnosis not present

## 2021-11-17 DIAGNOSIS — G4733 Obstructive sleep apnea (adult) (pediatric): Secondary | ICD-10-CM | POA: Diagnosis not present

## 2021-11-17 DIAGNOSIS — I10 Essential (primary) hypertension: Secondary | ICD-10-CM | POA: Diagnosis not present

## 2021-11-17 DIAGNOSIS — R296 Repeated falls: Secondary | ICD-10-CM | POA: Diagnosis not present

## 2021-11-17 DIAGNOSIS — J449 Chronic obstructive pulmonary disease, unspecified: Secondary | ICD-10-CM | POA: Diagnosis not present

## 2021-11-17 DIAGNOSIS — E871 Hypo-osmolality and hyponatremia: Secondary | ICD-10-CM | POA: Diagnosis not present

## 2021-11-17 DIAGNOSIS — E039 Hypothyroidism, unspecified: Secondary | ICD-10-CM | POA: Diagnosis not present

## 2021-11-17 DIAGNOSIS — I2510 Atherosclerotic heart disease of native coronary artery without angina pectoris: Secondary | ICD-10-CM | POA: Diagnosis not present

## 2021-11-17 DIAGNOSIS — M6281 Muscle weakness (generalized): Secondary | ICD-10-CM | POA: Diagnosis not present

## 2021-11-17 DIAGNOSIS — R531 Weakness: Secondary | ICD-10-CM | POA: Diagnosis not present

## 2021-11-17 DIAGNOSIS — R778 Other specified abnormalities of plasma proteins: Secondary | ICD-10-CM | POA: Diagnosis not present

## 2021-11-17 DIAGNOSIS — E1143 Type 2 diabetes mellitus with diabetic autonomic (poly)neuropathy: Secondary | ICD-10-CM | POA: Diagnosis not present

## 2021-11-28 DIAGNOSIS — I63339 Cerebral infarction due to thrombosis of unspecified posterior cerebral artery: Secondary | ICD-10-CM | POA: Diagnosis not present

## 2021-12-07 DIAGNOSIS — I639 Cerebral infarction, unspecified: Secondary | ICD-10-CM | POA: Diagnosis not present

## 2021-12-07 DIAGNOSIS — Z853 Personal history of malignant neoplasm of breast: Secondary | ICD-10-CM | POA: Diagnosis not present

## 2021-12-07 DIAGNOSIS — I10 Essential (primary) hypertension: Secondary | ICD-10-CM | POA: Diagnosis not present

## 2021-12-07 DIAGNOSIS — I259 Chronic ischemic heart disease, unspecified: Secondary | ICD-10-CM | POA: Diagnosis not present

## 2021-12-07 DIAGNOSIS — I69352 Hemiplegia and hemiparesis following cerebral infarction affecting left dominant side: Secondary | ICD-10-CM | POA: Diagnosis not present

## 2021-12-07 DIAGNOSIS — F17210 Nicotine dependence, cigarettes, uncomplicated: Secondary | ICD-10-CM | POA: Diagnosis not present

## 2021-12-07 DIAGNOSIS — Z515 Encounter for palliative care: Secondary | ICD-10-CM | POA: Diagnosis not present

## 2021-12-08 DIAGNOSIS — M8008XS Age-related osteoporosis with current pathological fracture, vertebra(e), sequela: Secondary | ICD-10-CM | POA: Diagnosis not present

## 2021-12-08 DIAGNOSIS — I10 Essential (primary) hypertension: Secondary | ICD-10-CM | POA: Diagnosis not present

## 2021-12-08 DIAGNOSIS — Z6833 Body mass index (BMI) 33.0-33.9, adult: Secondary | ICD-10-CM | POA: Diagnosis not present

## 2021-12-08 DIAGNOSIS — I63411 Cerebral infarction due to embolism of right middle cerebral artery: Secondary | ICD-10-CM | POA: Diagnosis not present

## 2021-12-28 DIAGNOSIS — Z170 Estrogen receptor positive status [ER+]: Secondary | ICD-10-CM | POA: Diagnosis not present

## 2021-12-28 DIAGNOSIS — B351 Tinea unguium: Secondary | ICD-10-CM | POA: Diagnosis not present

## 2021-12-28 DIAGNOSIS — G620 Drug-induced polyneuropathy: Secondary | ICD-10-CM | POA: Diagnosis not present

## 2021-12-28 DIAGNOSIS — E559 Vitamin D deficiency, unspecified: Secondary | ICD-10-CM | POA: Diagnosis not present

## 2021-12-28 DIAGNOSIS — C50511 Malignant neoplasm of lower-outer quadrant of right female breast: Secondary | ICD-10-CM | POA: Diagnosis not present

## 2021-12-28 DIAGNOSIS — C50911 Malignant neoplasm of unspecified site of right female breast: Secondary | ICD-10-CM | POA: Diagnosis not present

## 2021-12-29 DIAGNOSIS — I69952 Hemiplegia and hemiparesis following unspecified cerebrovascular disease affecting left dominant side: Secondary | ICD-10-CM | POA: Diagnosis not present

## 2021-12-29 DIAGNOSIS — R296 Repeated falls: Secondary | ICD-10-CM | POA: Diagnosis not present

## 2021-12-29 DIAGNOSIS — J449 Chronic obstructive pulmonary disease, unspecified: Secondary | ICD-10-CM | POA: Diagnosis not present

## 2022-01-04 DIAGNOSIS — C50911 Malignant neoplasm of unspecified site of right female breast: Secondary | ICD-10-CM | POA: Diagnosis not present

## 2022-01-07 DIAGNOSIS — Z853 Personal history of malignant neoplasm of breast: Secondary | ICD-10-CM | POA: Diagnosis not present

## 2022-01-07 DIAGNOSIS — I259 Chronic ischemic heart disease, unspecified: Secondary | ICD-10-CM | POA: Diagnosis not present

## 2022-01-07 DIAGNOSIS — I10 Essential (primary) hypertension: Secondary | ICD-10-CM | POA: Diagnosis not present

## 2022-01-07 DIAGNOSIS — Z515 Encounter for palliative care: Secondary | ICD-10-CM | POA: Diagnosis not present

## 2022-01-07 DIAGNOSIS — I639 Cerebral infarction, unspecified: Secondary | ICD-10-CM | POA: Diagnosis not present

## 2022-01-07 DIAGNOSIS — I69352 Hemiplegia and hemiparesis following cerebral infarction affecting left dominant side: Secondary | ICD-10-CM | POA: Diagnosis not present

## 2022-01-07 DIAGNOSIS — F17210 Nicotine dependence, cigarettes, uncomplicated: Secondary | ICD-10-CM | POA: Diagnosis not present

## 2022-01-28 DIAGNOSIS — R296 Repeated falls: Secondary | ICD-10-CM | POA: Diagnosis not present

## 2022-01-28 DIAGNOSIS — J449 Chronic obstructive pulmonary disease, unspecified: Secondary | ICD-10-CM | POA: Diagnosis not present

## 2022-01-28 DIAGNOSIS — I69952 Hemiplegia and hemiparesis following unspecified cerebrovascular disease affecting left dominant side: Secondary | ICD-10-CM | POA: Diagnosis not present

## 2022-02-08 ENCOUNTER — Encounter (INDEPENDENT_AMBULATORY_CARE_PROVIDER_SITE_OTHER): Payer: Self-pay

## 2022-02-23 DIAGNOSIS — H5213 Myopia, bilateral: Secondary | ICD-10-CM | POA: Diagnosis not present

## 2022-02-24 DIAGNOSIS — Z853 Personal history of malignant neoplasm of breast: Secondary | ICD-10-CM | POA: Diagnosis not present

## 2022-02-24 DIAGNOSIS — I69352 Hemiplegia and hemiparesis following cerebral infarction affecting left dominant side: Secondary | ICD-10-CM | POA: Diagnosis not present

## 2022-02-24 DIAGNOSIS — I10 Essential (primary) hypertension: Secondary | ICD-10-CM | POA: Diagnosis not present

## 2022-02-24 DIAGNOSIS — Z515 Encounter for palliative care: Secondary | ICD-10-CM | POA: Diagnosis not present

## 2022-02-24 DIAGNOSIS — F17210 Nicotine dependence, cigarettes, uncomplicated: Secondary | ICD-10-CM | POA: Diagnosis not present

## 2022-02-24 DIAGNOSIS — I639 Cerebral infarction, unspecified: Secondary | ICD-10-CM | POA: Diagnosis not present

## 2022-02-24 DIAGNOSIS — I259 Chronic ischemic heart disease, unspecified: Secondary | ICD-10-CM | POA: Diagnosis not present

## 2022-02-28 DIAGNOSIS — I69952 Hemiplegia and hemiparesis following unspecified cerebrovascular disease affecting left dominant side: Secondary | ICD-10-CM | POA: Diagnosis not present

## 2022-02-28 DIAGNOSIS — R296 Repeated falls: Secondary | ICD-10-CM | POA: Diagnosis not present

## 2022-02-28 DIAGNOSIS — J449 Chronic obstructive pulmonary disease, unspecified: Secondary | ICD-10-CM | POA: Diagnosis not present

## 2022-03-10 DIAGNOSIS — I63411 Cerebral infarction due to embolism of right middle cerebral artery: Secondary | ICD-10-CM | POA: Diagnosis not present

## 2022-03-10 DIAGNOSIS — N3946 Mixed incontinence: Secondary | ICD-10-CM | POA: Diagnosis not present

## 2022-03-10 DIAGNOSIS — E118 Type 2 diabetes mellitus with unspecified complications: Secondary | ICD-10-CM | POA: Diagnosis not present

## 2022-03-10 DIAGNOSIS — M810 Age-related osteoporosis without current pathological fracture: Secondary | ICD-10-CM | POA: Diagnosis not present

## 2022-03-23 ENCOUNTER — Ambulatory Visit (INDEPENDENT_AMBULATORY_CARE_PROVIDER_SITE_OTHER): Payer: Medicare HMO | Admitting: Vascular Surgery

## 2022-03-23 ENCOUNTER — Encounter (INDEPENDENT_AMBULATORY_CARE_PROVIDER_SITE_OTHER): Payer: Self-pay | Admitting: Vascular Surgery

## 2022-03-23 ENCOUNTER — Other Ambulatory Visit (INDEPENDENT_AMBULATORY_CARE_PROVIDER_SITE_OTHER): Payer: Self-pay | Admitting: Vascular Surgery

## 2022-03-23 ENCOUNTER — Other Ambulatory Visit (INDEPENDENT_AMBULATORY_CARE_PROVIDER_SITE_OTHER): Payer: Medicare HMO

## 2022-03-23 VITALS — BP 183/79 | HR 68 | Resp 16 | Wt 211.0 lb

## 2022-03-23 DIAGNOSIS — E785 Hyperlipidemia, unspecified: Secondary | ICD-10-CM | POA: Diagnosis not present

## 2022-03-23 DIAGNOSIS — I6523 Occlusion and stenosis of bilateral carotid arteries: Secondary | ICD-10-CM

## 2022-03-23 DIAGNOSIS — I10 Essential (primary) hypertension: Secondary | ICD-10-CM

## 2022-03-23 DIAGNOSIS — I779 Disorder of arteries and arterioles, unspecified: Secondary | ICD-10-CM | POA: Diagnosis not present

## 2022-03-23 DIAGNOSIS — E119 Type 2 diabetes mellitus without complications: Secondary | ICD-10-CM | POA: Diagnosis not present

## 2022-03-23 NOTE — Assessment & Plan Note (Signed)
Carotid duplex today reveals mild, 1 to 39% right ICA stenosis which is stable.  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  This is stable from her previous studies.  This was unlikely the cause of her recent right hemispheric stroke.  No role for intervention.  Recheck in 1 year.  No change in medical regimen which includes dual antiplatelet therapy and high-dose Lipitor.

## 2022-03-23 NOTE — Progress Notes (Signed)
MRN : [id number]  Illana D Wilcox is a 71 y.o. (November 06, 1950) female who presents with chief complaint of  Chief Complaint  Patient presents with   Follow-up    Ultrasound follow up  .  History of Present Illness: Patient returns in follow-up of her carotid disease.  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  Shortly thereafter, she had a stroke which affected her left arm and hand and made her fine motor skills on the left before.  She also had facial droop.  She was taken off of the chemotherapeutic agent changed around this her oncologist told her that was a risk of causing stroke. Carotid duplex today reveals mild, 1 to 39% right ICA stenosis which is stable.  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  This is stable from her previous studies.   Current Outpatient Medications  Medication Sig Dispense Refill   alendronate (FOSAMAX) 70 MG tablet Take 1 tablet by mouth once a week.     aspirin 81 MG EC tablet Take 81 mg by mouth daily. Swallow whole.     atorvastatin (LIPITOR) 80 MG tablet Take 80 mg by mouth at bedtime.      buPROPion (WELLBUTRIN XL) 150 MG 24 hr tablet Take 150 mg by mouth every morning.      Cholecalciferol (VITAMIN D) 125 MCG (5000 UT) CAPS Take 5,000 Units by mouth daily.     clopidogrel (PLAVIX) 75 MG tablet Take 75 mg by mouth daily.      furosemide (LASIX) 20 MG tablet Take 20 mg by mouth 2 (two) times daily.      gabapentin (NEURONTIN) 300 MG capsule Take 300 mg by mouth in the morning and at bedtime.     isosorbide mononitrate (IMDUR) 60 MG 24 hr tablet Take 60 mg by mouth every morning.      levothyroxine (SYNTHROID, LEVOTHROID) 150 MCG tablet Take 150 mcg by mouth daily before breakfast.      metoprolol succinate (TOPROL-XL) 25 MG 24 hr tablet Take 25 mg by mouth daily.     omeprazole (PRILOSEC OTC) 20 MG tablet Take 20 mg by  mouth every morning.     pioglitazone (ACTOS) 45 MG tablet Take 45 mg by mouth daily.      pramipexole (MIRAPEX) 0.25 MG tablet Take 0.25 mg by mouth at bedtime.      valsartan (DIOVAN) 40 MG tablet Take 40 mg by mouth daily.     ADVAIR DISKUS 250-50 MCG/DOSE AEPB Inhale 1 puff into the lungs in the morning and at bedtime. (Patient not taking: Reported on 10/30/2020)     albuterol (PROVENTIL) (2.5 MG/3ML) 0.083% nebulizer solution Take 2.5 mg by nebulization every 6 (six) hours as needed for wheezing or shortness of breath.  (Patient not taking: Reported on 10/30/2020)     INCRUSE ELLIPTA 62.5 MCG/INH AEPB Inhale 1 puff into the lungs daily.      letrozole (FEMARA) 2.5 MG tablet Take 1 tablet (2.5 mg total) by mouth daily. Once a day. 90 tablet 3   No current facility-administered medications for this visit.    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[place])    occluded left internal carotid and moderate right ICAstenosis   Thoracic compression fracture [facility])     Past Surgical History:  Procedure Laterality Date   APPENDECTOMY     BREAST SURGERY     CARDIAC CATHETERIZATION  2010   recath revealing non-critica CAD   CARDIAC SURGERY     X3   carotid doppler  10/20/09   right bulb and ICA 50-69%,left CCA occluded   CHOLECYSTECTOMY     CORONARY ANGIOPLASTY  2009   s/p LAD AND CIRC STENT   LAPAROTOMY  04/07/2011   BOWEL OBSTRUCTIONProcedure: EXPLORATORY LAPAROTOMY;  Surgeon: Donato Heinz, MD;  Location: [facility];  Service: General;  Laterality: N/A;    MASTECTOMY Left 2005   MASTECTOMY MODIFIED RADICAL Right 08/13/2019   Procedure: Modified Radical Mastectomy;  Surgeon: Robert Bellow, MD;  Location: [facility] ORS;  Service: General;  Laterality: Right;  right breast modified radical mastectomy   NM [place]  July 02, 2010   entirely normal   THYROID SURGERY     TUBAL LIGATION     WOUND DEBRIDEMENT Right 09/28/2019   Procedure: DEBRIDEMENT WOUND RIGHT MASTECTOMY SITE;  Surgeon: Robert Bellow, MD;  Location: [facility] ORS;  Service: General;  Laterality: Right;     Social History   Tobacco Use   Smoking status: Every Day    Packs/day: 1.00    Years: 34.00    Total pack years: 34.00    Types: Cigarettes   Smokeless tobacco: Never   Tobacco comments:    currently smoking less than a pack per day  Vaping Use   Vaping Use: Never used  Substance Use Topics   Alcohol use: No   Drug use: No      Family History  Problem Relation Age of Onset   Crohn's disease Sister    Breast cancer Neg Hx     No Known Allergies   REVIEW OF SYSTEMS (Negative unless checked)   Constitutional: '[]'$ Weight loss  '[]'$ Fever  '[]'$ Chills Cardiac: '[]'$ Chest pain   '[]'$ Chest pressure   '[]'$ Palpitations   '[]'$ Shortness of breath when laying flat   '[]'$ Shortness of breath at rest   '[x]'$ Shortness of breath with exertion. Vascular:  '[]'$ Pain in legs with walking   '[]'$ Pain in legs at rest   '[]'$ Pain in legs when laying flat   '[]'$ Claudication   '[]'$ Pain in feet when walking  '[]'$ Pain in feet at rest  '[]'$ Pain in feet when laying flat   '[]'$ History of DVT   '[]'$ Phlebitis   '[]'$ Swelling in legs   '[]'$ Varicose veins   '[]'$ Non-healing ulcers Pulmonary:   '[]'$ Uses home oxygen   '[]'$ Productive cough   '[]'$ Hemoptysis   '[x]'$ Wheeze  '[x]'$ COPD   '[]'$ Asthma Neurologic:  '[x]'$ Dizziness  '[]'$ Blackouts   '[]'$ Seizures   '[]'$ History of stroke   '[]'$ History of TIA  '[]'$ Aphasia   '[]'$ Temporary blindness   '[]'$ Dysphagia   '[]'$ Weakness or numbness in arms   '[]'$ Weakness or numbness in legs Musculoskeletal:  '[x]'$ Arthritis   '[]'$ Joint  swelling   '[]'$ Joint pain   '[]'$ Low back pain Hematologic:  '[]'$ Easy bruising  '[]'$ Easy bleeding   '[]'$ Hypercoagulable state   '[]'$ Anemic  '[]'$ Hepatitis Gastrointestinal:  '[]'$   Blood in stool   '[]'$ Vomiting blood  '[x]'$ Gastroesophageal reflux/heartburn   '[]'$ Abdominal pain Genitourinary:  '[]'$ Chronic kidney disease   '[]'$ Difficult urination  '[]'$ Frequent urination  '[]'$ Burning with urination   '[]'$ Hematuria Skin:  '[]'$ Rashes   '[]'$ Ulcers   '[]'$ Wounds Psychological:  '[]'$ History of anxiety   '[]'$  History of major depression.  Physical Examination  Vitals:   03/23/22 0956  BP: (!) 183/79  Pulse: 68  Resp: 16  Weight: 211 lb (95.7 kg)   Body mass index is 37.38 kg/m. Gen:  WD/WN, NAD Head: [place]/AT, No temporalis wasting. Ear/Nose/Throat: Hearing grossly intact, nares w/o erythema or drainage, trachea midline Eyes: Conjunctiva clear. Sclera non-icteric Neck: Supple.  No bruit  Pulmonary:  Good air movement, equal and clear to auscultation bilaterally.  Cardiac: RRR, No JVD Vascular:  Vessel Right Left  Radial Palpable Palpable       Musculoskeletal: M/S 5/5 throughout.  No deformity or atrophy. Mild LE edema. Neurologic: CN 2-12 intact. Sensation grossly intact in extremities.  Symmetrical.  Speech is fluent. Motor exam as listed above. Psychiatric: Judgment intact, Mood & affect appropriate for pt's clinical situation. Dermatologic: No rashes or ulcers noted.  No cellulitis or open wounds.     CBC Lab Results  Component Value Date   WBC 5.1 10/30/2020   HGB 12.3 10/30/2020   HCT 38.5 10/30/2020   MCV 99.5 10/30/2020   PLT 240 10/30/2020    BMET    Component Value Date/Time   NA 140 10/30/2020 0947   K 3.8 10/30/2020 0947   CL 104 10/30/2020 0947   CO2 28 10/30/2020 0947   GLUCOSE 242 (H) 10/30/2020 0947   BUN 18 10/30/2020 0947   CREATININE 0.70 10/30/2020 0947   CALCIUM 8.8 (L) 10/30/2020 0947   GFRNONAA >60 10/30/2020 0947   GFRAA >60 08/01/2019 1016   CrCl cannot be calculated (Patient's most recent  lab result is older than the maximum 21 days allowed.).  COAG Lab Results  Component Value Date   INR 1.04 04/01/2011    Radiology No results found.   Assessment/Plan HTN (hypertension) blood pressure control important in reducing the progression of atherosclerotic disease. On appropriate oral medications.     Hyperlipidemia lipid control important in reducing the progression of atherosclerotic disease. Continue statin therapy     DM type 2 (diabetes mellitus, type 2) blood glucose control important in reducing the progression of atherosclerotic disease. Also, involved in wound healing. On appropriate medications.  Carotid artery disease ([place]) Carotid duplex today reveals mild, 1 to 39% right ICA stenosis which is stable.  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  This is stable from her previous studies.  This was unlikely the cause of her recent right hemispheric stroke.  No role for intervention.  Recheck in 1 year.  No change in medical regimen which includes dual antiplatelet therapy and high-dose Lipitor.    Leotis Pain, MD  03/23/2022 10:24 AM    This note was created with Dragon medical transcription system.  Any errors from dictation are purely unintentional

## 2022-03-30 DIAGNOSIS — J449 Chronic obstructive pulmonary disease, unspecified: Secondary | ICD-10-CM | POA: Diagnosis not present

## 2022-03-30 DIAGNOSIS — R296 Repeated falls: Secondary | ICD-10-CM | POA: Diagnosis not present

## 2022-03-30 DIAGNOSIS — I69952 Hemiplegia and hemiparesis following unspecified cerebrovascular disease affecting left dominant side: Secondary | ICD-10-CM | POA: Diagnosis not present

## 2022-04-30 DIAGNOSIS — R296 Repeated falls: Secondary | ICD-10-CM | POA: Diagnosis not present

## 2022-04-30 DIAGNOSIS — I69952 Hemiplegia and hemiparesis following unspecified cerebrovascular disease affecting left dominant side: Secondary | ICD-10-CM | POA: Diagnosis not present

## 2022-04-30 DIAGNOSIS — J449 Chronic obstructive pulmonary disease, unspecified: Secondary | ICD-10-CM | POA: Diagnosis not present

## 2022-05-10 DIAGNOSIS — E755 Other lipid storage disorders: Secondary | ICD-10-CM | POA: Diagnosis not present

## 2022-05-31 DIAGNOSIS — R296 Repeated falls: Secondary | ICD-10-CM | POA: Diagnosis not present

## 2022-05-31 DIAGNOSIS — I69952 Hemiplegia and hemiparesis following unspecified cerebrovascular disease affecting left dominant side: Secondary | ICD-10-CM | POA: Diagnosis not present

## 2022-05-31 DIAGNOSIS — J449 Chronic obstructive pulmonary disease, unspecified: Secondary | ICD-10-CM | POA: Diagnosis not present

## 2022-06-08 DIAGNOSIS — M9903 Segmental and somatic dysfunction of lumbar region: Secondary | ICD-10-CM | POA: Diagnosis not present

## 2022-06-08 DIAGNOSIS — M9901 Segmental and somatic dysfunction of cervical region: Secondary | ICD-10-CM | POA: Diagnosis not present

## 2022-06-08 DIAGNOSIS — M5136 Other intervertebral disc degeneration, lumbar region: Secondary | ICD-10-CM | POA: Diagnosis not present

## 2022-06-08 DIAGNOSIS — M5033 Other cervical disc degeneration, cervicothoracic region: Secondary | ICD-10-CM | POA: Diagnosis not present

## 2022-06-10 DIAGNOSIS — M9901 Segmental and somatic dysfunction of cervical region: Secondary | ICD-10-CM | POA: Diagnosis not present

## 2022-06-10 DIAGNOSIS — M5136 Other intervertebral disc degeneration, lumbar region: Secondary | ICD-10-CM | POA: Diagnosis not present

## 2022-06-10 DIAGNOSIS — M5033 Other cervical disc degeneration, cervicothoracic region: Secondary | ICD-10-CM | POA: Diagnosis not present

## 2022-06-10 DIAGNOSIS — M9903 Segmental and somatic dysfunction of lumbar region: Secondary | ICD-10-CM | POA: Diagnosis not present

## 2022-06-14 DIAGNOSIS — M9901 Segmental and somatic dysfunction of cervical region: Secondary | ICD-10-CM | POA: Diagnosis not present

## 2022-06-14 DIAGNOSIS — M5136 Other intervertebral disc degeneration, lumbar region: Secondary | ICD-10-CM | POA: Diagnosis not present

## 2022-06-14 DIAGNOSIS — M9903 Segmental and somatic dysfunction of lumbar region: Secondary | ICD-10-CM | POA: Diagnosis not present

## 2022-06-14 DIAGNOSIS — M5033 Other cervical disc degeneration, cervicothoracic region: Secondary | ICD-10-CM | POA: Diagnosis not present

## 2022-06-16 DIAGNOSIS — M5136 Other intervertebral disc degeneration, lumbar region: Secondary | ICD-10-CM | POA: Diagnosis not present

## 2022-06-16 DIAGNOSIS — M5033 Other cervical disc degeneration, cervicothoracic region: Secondary | ICD-10-CM | POA: Diagnosis not present

## 2022-06-16 DIAGNOSIS — M9903 Segmental and somatic dysfunction of lumbar region: Secondary | ICD-10-CM | POA: Diagnosis not present

## 2022-06-16 DIAGNOSIS — M9901 Segmental and somatic dysfunction of cervical region: Secondary | ICD-10-CM | POA: Diagnosis not present

## 2022-06-17 DIAGNOSIS — M9903 Segmental and somatic dysfunction of lumbar region: Secondary | ICD-10-CM | POA: Diagnosis not present

## 2022-06-17 DIAGNOSIS — M5136 Other intervertebral disc degeneration, lumbar region: Secondary | ICD-10-CM | POA: Diagnosis not present

## 2022-06-17 DIAGNOSIS — M5033 Other cervical disc degeneration, cervicothoracic region: Secondary | ICD-10-CM | POA: Diagnosis not present

## 2022-06-17 DIAGNOSIS — M9901 Segmental and somatic dysfunction of cervical region: Secondary | ICD-10-CM | POA: Diagnosis not present

## 2022-06-21 DIAGNOSIS — M9903 Segmental and somatic dysfunction of lumbar region: Secondary | ICD-10-CM | POA: Diagnosis not present

## 2022-06-21 DIAGNOSIS — M9901 Segmental and somatic dysfunction of cervical region: Secondary | ICD-10-CM | POA: Diagnosis not present

## 2022-06-21 DIAGNOSIS — M5033 Other cervical disc degeneration, cervicothoracic region: Secondary | ICD-10-CM | POA: Diagnosis not present

## 2022-06-21 DIAGNOSIS — M5136 Other intervertebral disc degeneration, lumbar region: Secondary | ICD-10-CM | POA: Diagnosis not present

## 2022-06-23 DIAGNOSIS — M5033 Other cervical disc degeneration, cervicothoracic region: Secondary | ICD-10-CM | POA: Diagnosis not present

## 2022-06-23 DIAGNOSIS — M9901 Segmental and somatic dysfunction of cervical region: Secondary | ICD-10-CM | POA: Diagnosis not present

## 2022-06-23 DIAGNOSIS — M5136 Other intervertebral disc degeneration, lumbar region: Secondary | ICD-10-CM | POA: Diagnosis not present

## 2022-06-23 DIAGNOSIS — M9903 Segmental and somatic dysfunction of lumbar region: Secondary | ICD-10-CM | POA: Diagnosis not present

## 2022-06-24 DIAGNOSIS — M5033 Other cervical disc degeneration, cervicothoracic region: Secondary | ICD-10-CM | POA: Diagnosis not present

## 2022-06-24 DIAGNOSIS — M9901 Segmental and somatic dysfunction of cervical region: Secondary | ICD-10-CM | POA: Diagnosis not present

## 2022-06-24 DIAGNOSIS — M5136 Other intervertebral disc degeneration, lumbar region: Secondary | ICD-10-CM | POA: Diagnosis not present

## 2022-06-24 DIAGNOSIS — M9903 Segmental and somatic dysfunction of lumbar region: Secondary | ICD-10-CM | POA: Diagnosis not present

## 2022-06-28 DIAGNOSIS — M9903 Segmental and somatic dysfunction of lumbar region: Secondary | ICD-10-CM | POA: Diagnosis not present

## 2022-06-28 DIAGNOSIS — M5033 Other cervical disc degeneration, cervicothoracic region: Secondary | ICD-10-CM | POA: Diagnosis not present

## 2022-06-28 DIAGNOSIS — M5136 Other intervertebral disc degeneration, lumbar region: Secondary | ICD-10-CM | POA: Diagnosis not present

## 2022-06-28 DIAGNOSIS — M9901 Segmental and somatic dysfunction of cervical region: Secondary | ICD-10-CM | POA: Diagnosis not present

## 2022-06-29 DIAGNOSIS — I69952 Hemiplegia and hemiparesis following unspecified cerebrovascular disease affecting left dominant side: Secondary | ICD-10-CM | POA: Diagnosis not present

## 2022-06-29 DIAGNOSIS — J449 Chronic obstructive pulmonary disease, unspecified: Secondary | ICD-10-CM | POA: Diagnosis not present

## 2022-06-29 DIAGNOSIS — R296 Repeated falls: Secondary | ICD-10-CM | POA: Diagnosis not present

## 2022-06-30 DIAGNOSIS — M9901 Segmental and somatic dysfunction of cervical region: Secondary | ICD-10-CM | POA: Diagnosis not present

## 2022-06-30 DIAGNOSIS — M5136 Other intervertebral disc degeneration, lumbar region: Secondary | ICD-10-CM | POA: Diagnosis not present

## 2022-06-30 DIAGNOSIS — M5033 Other cervical disc degeneration, cervicothoracic region: Secondary | ICD-10-CM | POA: Diagnosis not present

## 2022-06-30 DIAGNOSIS — M9903 Segmental and somatic dysfunction of lumbar region: Secondary | ICD-10-CM | POA: Diagnosis not present

## 2022-07-01 DIAGNOSIS — M5033 Other cervical disc degeneration, cervicothoracic region: Secondary | ICD-10-CM | POA: Diagnosis not present

## 2022-07-01 DIAGNOSIS — M9901 Segmental and somatic dysfunction of cervical region: Secondary | ICD-10-CM | POA: Diagnosis not present

## 2022-07-01 DIAGNOSIS — M5136 Other intervertebral disc degeneration, lumbar region: Secondary | ICD-10-CM | POA: Diagnosis not present

## 2022-07-01 DIAGNOSIS — M9903 Segmental and somatic dysfunction of lumbar region: Secondary | ICD-10-CM | POA: Diagnosis not present

## 2022-07-05 DIAGNOSIS — C50511 Malignant neoplasm of lower-outer quadrant of right female breast: Secondary | ICD-10-CM | POA: Diagnosis not present

## 2022-07-05 DIAGNOSIS — E559 Vitamin D deficiency, unspecified: Secondary | ICD-10-CM | POA: Diagnosis not present

## 2022-07-05 DIAGNOSIS — B351 Tinea unguium: Secondary | ICD-10-CM | POA: Diagnosis not present

## 2022-07-05 DIAGNOSIS — Z170 Estrogen receptor positive status [ER+]: Secondary | ICD-10-CM | POA: Diagnosis not present

## 2022-07-05 DIAGNOSIS — C50911 Malignant neoplasm of unspecified site of right female breast: Secondary | ICD-10-CM | POA: Diagnosis not present

## 2022-07-07 DIAGNOSIS — M9903 Segmental and somatic dysfunction of lumbar region: Secondary | ICD-10-CM | POA: Diagnosis not present

## 2022-07-07 DIAGNOSIS — M5033 Other cervical disc degeneration, cervicothoracic region: Secondary | ICD-10-CM | POA: Diagnosis not present

## 2022-07-07 DIAGNOSIS — M5136 Other intervertebral disc degeneration, lumbar region: Secondary | ICD-10-CM | POA: Diagnosis not present

## 2022-07-07 DIAGNOSIS — M9901 Segmental and somatic dysfunction of cervical region: Secondary | ICD-10-CM | POA: Diagnosis not present

## 2022-07-08 DIAGNOSIS — C50911 Malignant neoplasm of unspecified site of right female breast: Secondary | ICD-10-CM | POA: Diagnosis not present

## 2022-07-14 DIAGNOSIS — I639 Cerebral infarction, unspecified: Secondary | ICD-10-CM | POA: Diagnosis not present

## 2022-07-14 DIAGNOSIS — I10 Essential (primary) hypertension: Secondary | ICD-10-CM | POA: Diagnosis not present

## 2022-07-14 DIAGNOSIS — S22000A Wedge compression fracture of unspecified thoracic vertebra, initial encounter for closed fracture: Secondary | ICD-10-CM | POA: Diagnosis not present

## 2022-07-14 DIAGNOSIS — G8194 Hemiplegia, unspecified affecting left nondominant side: Secondary | ICD-10-CM | POA: Diagnosis not present

## 2022-07-14 DIAGNOSIS — Z515 Encounter for palliative care: Secondary | ICD-10-CM | POA: Diagnosis not present

## 2022-07-28 IMAGING — MR MR THORACIC SPINE W/O CM
7 series · 39 of 48 positions shown · non-contrast
Comparison: CT chest 03/23/2019

CLINICAL DATA: Closed wedge compression fracture T11 vertebra.

EXAM:
MRI THORACIC SPINE WITHOUT CONTRAST
TECHNIQUE: Multiplanar, multisequence MR imaging of the thoracic spine was
performed. No intravenous contrast was administered.

[Series 18: T1 · sagittal · 6.0mm · 1.88mm/px · 3 of 9 slices shown (1 of 2)]
[im 1/9]
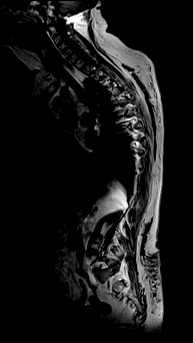
[im 5/9]
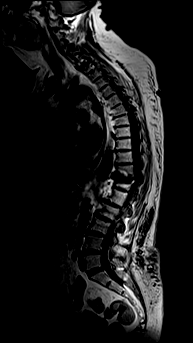
[im 9/9]
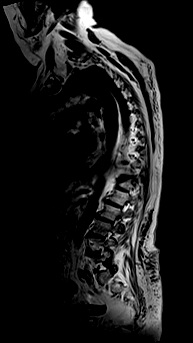

[Series 19: T2 · sagittal · 3.0mm · 1.33mm/px · 4 of 17 slices shown (1 of 3)]
[im 1/17]
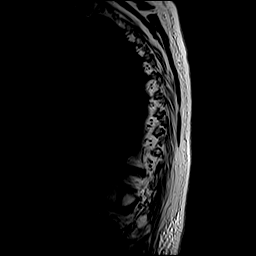
[im 6/17]
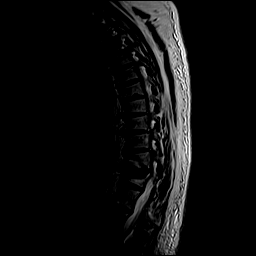
[im 11/17]
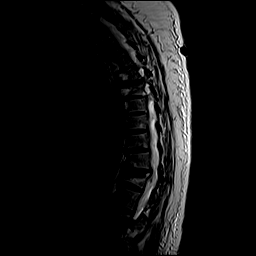
[im 17/17]
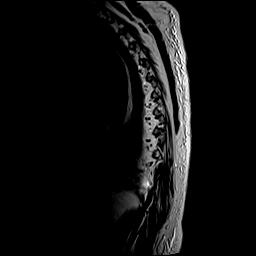

[Series 20: T1 · sagittal · 3.0mm · 1.33mm/px · 4 of 17 slices shown (2 of 2)]
[im 1/17]
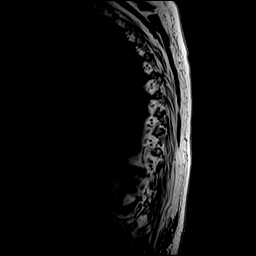
[im 6/17]
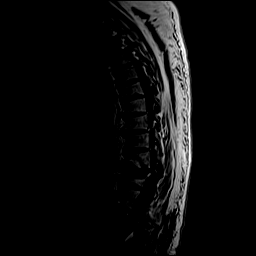
[im 11/17]
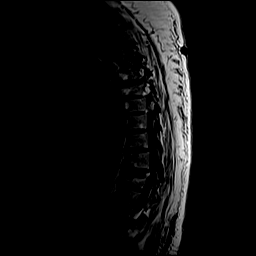
[im 17/17]
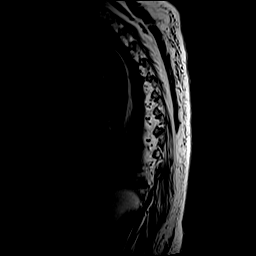

[Series 21: STIR · sagittal · 3.0mm · 0.66mm/px · 4 of 17 slices shown]
[im 1/17]
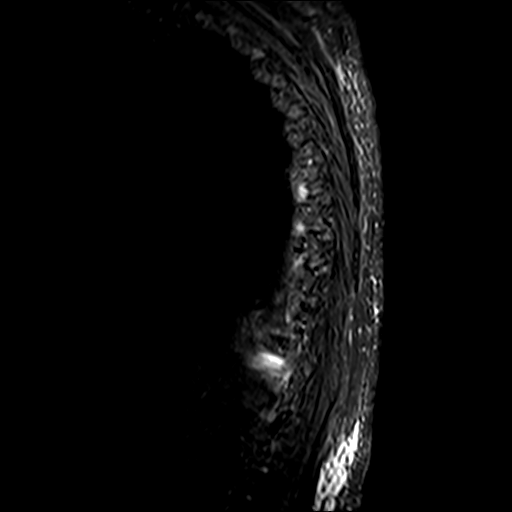
[im 6/17]
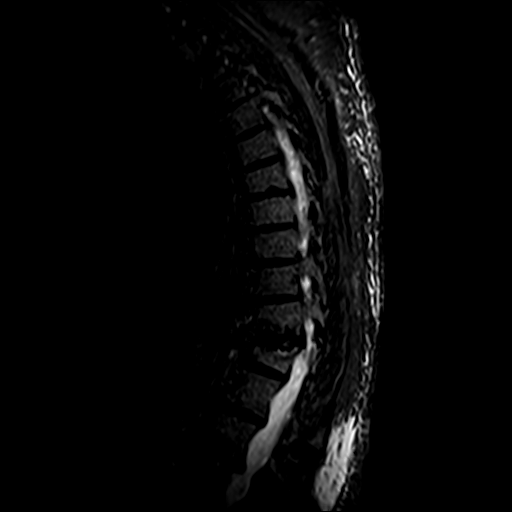
[im 11/17]
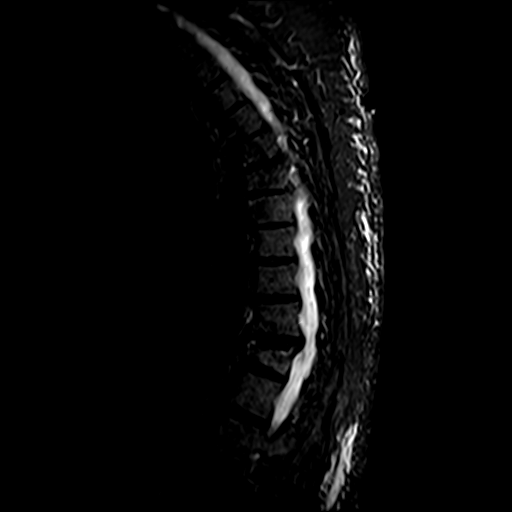
[im 17/17]
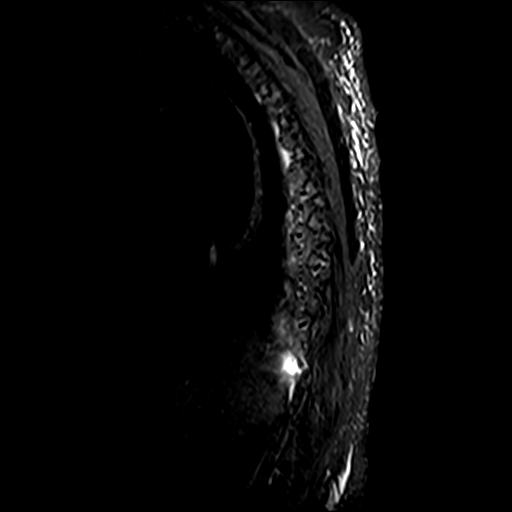

[Series 22: T2 · axial · 4.0mm · 0.59mm/px · z∈[-279,-117]mm · 11 of 41 slices shown (2 of 3)]
[im 1/41]
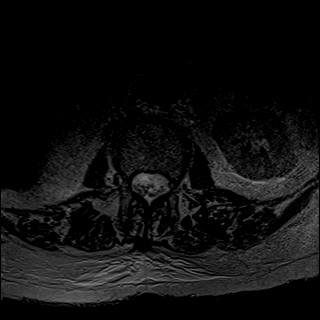
[im 5/41]
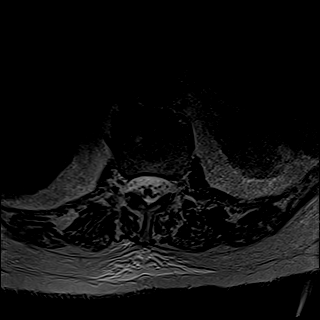
[im 9/41]
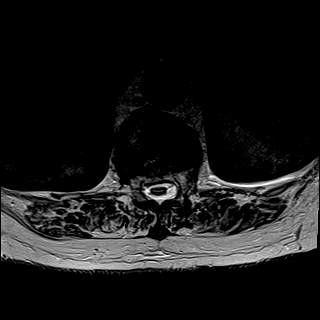
[im 13/41]
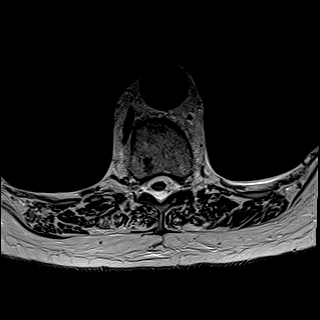
[im 17/41]
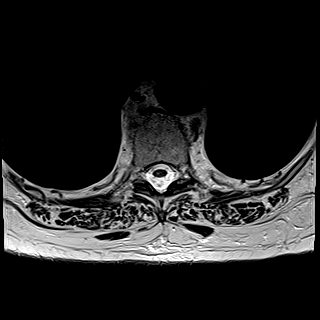
[im 21/41]
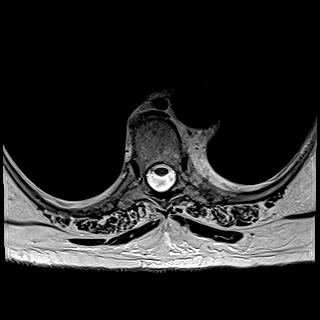
[im 25/41]
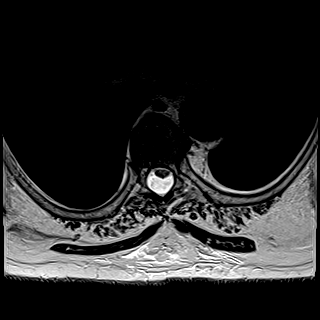
[im 29/41]
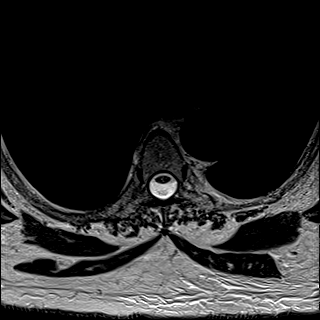
[im 33/41]
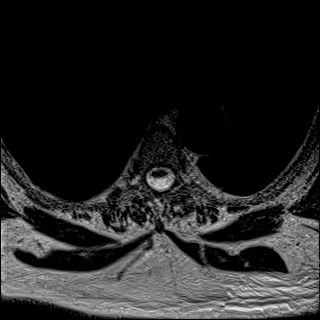
[im 37/41]
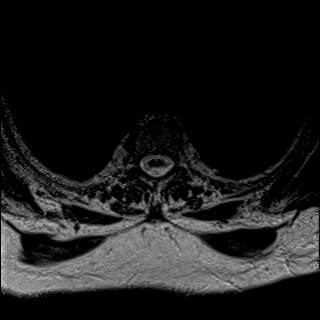
[im 41/41]
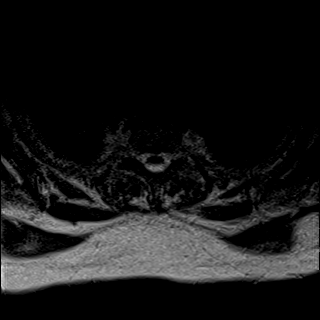

[Series 23: GRE · axial · 4.0mm · 0.37mm/px · z∈[-279,-248]mm · 2 of 41 slices shown]
[im 1/41]
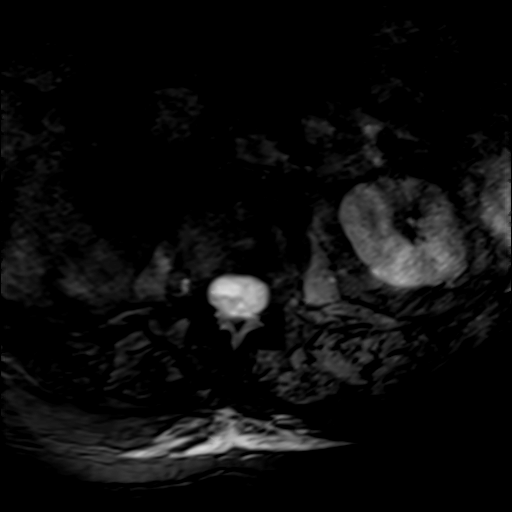
[im 9/41]
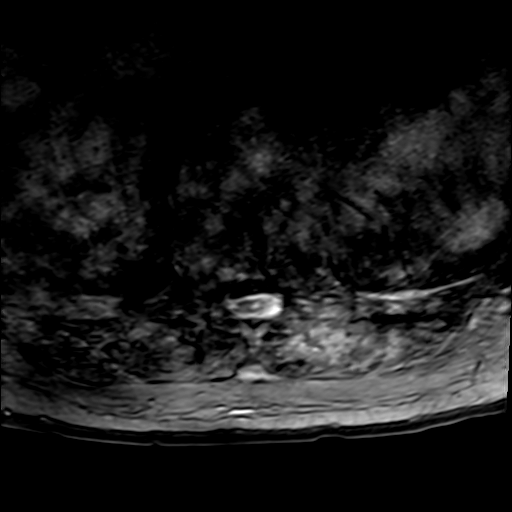

[Series 24: T2 · axial · 4.0mm · 0.59mm/px · z∈[-279,-117]mm · 11 of 41 slices shown (3 of 3)]
[im 1/41]
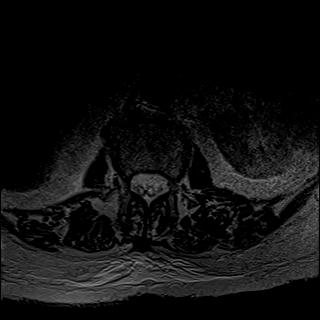
[im 5/41]
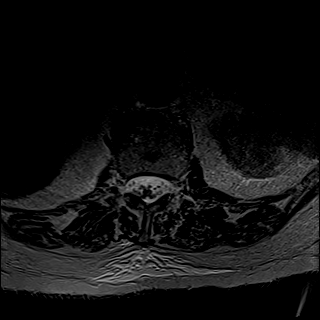
[im 9/41]
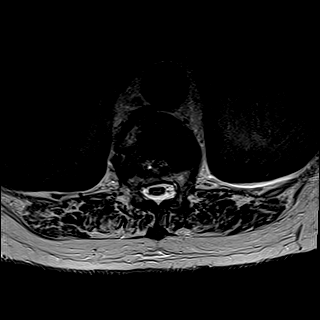
[im 13/41]
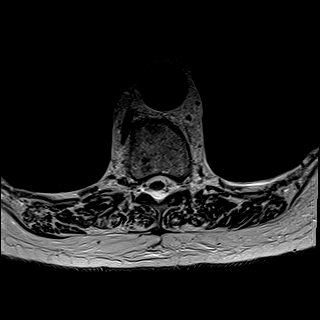
[im 17/41]
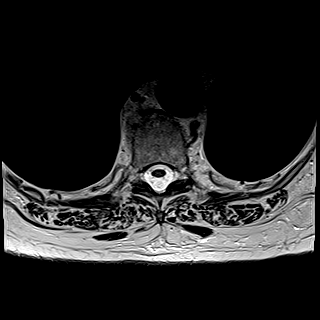
[im 21/41]
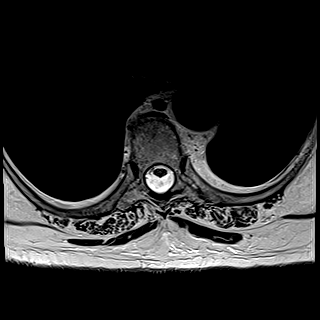
[im 25/41]
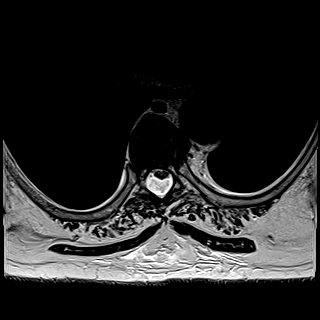
[im 29/41]
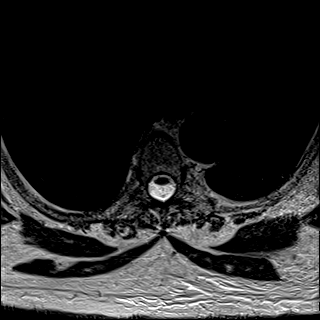
[im 33/41]
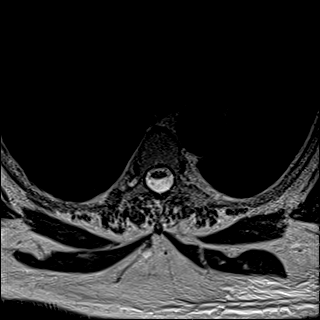
[im 37/41]
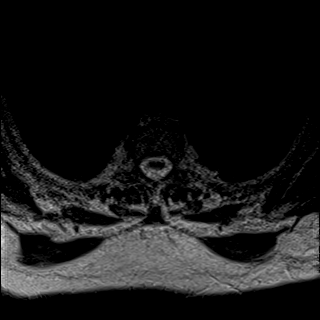
[im 41/41]
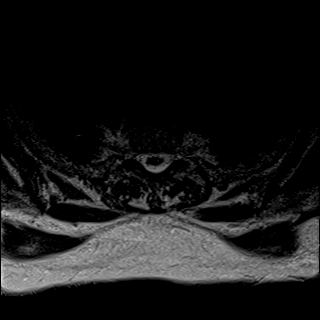

[39 of 48 positions shown; findings below may reference images not displayed]

FINDINGS: Alignment:  Mild retrolisthesis T12-L1, L1-2, L2-3.

Vertebrae: Mild fracture inferior endplate of T11. Moderate fracture
superior endplate of T12. These fractures appear chronic without
edema. These were present on the prior CT and are unchanged. There
is mild retropulsion of the superior endplate of T12 into the canal
contributing to spinal stenosis.

No other fracture or mass lesion identified.

Cord:  Normal spinal cord signal.

Paraspinal and other soft tissues: No paraspinous mass or edema. No
pleural effusion.

Disc levels:

Mild disc degeneration throughout the thoracic spine with disc
desiccation and disc space narrowing. No disc protrusion identified

T11-12: Chronic fractures of T11 and T12 with posterior spurring
causing mild spinal stenosis. No cord compression. Fractures appear
chronic without bone marrow edema.
IMPRESSION: Chronic fractures T11 and T12 unchanged from prior CT chest
03/23/2019. Bony retropulsion into the canal causing mild spinal
stenosis. No cord compression

Diffuse thoracic disc degeneration without disc protrusion.

## 2022-07-30 DIAGNOSIS — J449 Chronic obstructive pulmonary disease, unspecified: Secondary | ICD-10-CM | POA: Diagnosis not present

## 2022-07-30 DIAGNOSIS — I69952 Hemiplegia and hemiparesis following unspecified cerebrovascular disease affecting left dominant side: Secondary | ICD-10-CM | POA: Diagnosis not present

## 2022-07-30 DIAGNOSIS — R296 Repeated falls: Secondary | ICD-10-CM | POA: Diagnosis not present

## 2022-08-29 DIAGNOSIS — J449 Chronic obstructive pulmonary disease, unspecified: Secondary | ICD-10-CM | POA: Diagnosis not present

## 2022-08-29 DIAGNOSIS — R296 Repeated falls: Secondary | ICD-10-CM | POA: Diagnosis not present

## 2022-08-29 DIAGNOSIS — I69952 Hemiplegia and hemiparesis following unspecified cerebrovascular disease affecting left dominant side: Secondary | ICD-10-CM | POA: Diagnosis not present

## 2022-10-10 DIAGNOSIS — M810 Age-related osteoporosis without current pathological fracture: Secondary | ICD-10-CM | POA: Diagnosis not present

## 2022-10-10 DIAGNOSIS — I10 Essential (primary) hypertension: Secondary | ICD-10-CM | POA: Diagnosis not present

## 2022-11-09 DIAGNOSIS — Z79899 Other long term (current) drug therapy: Secondary | ICD-10-CM | POA: Diagnosis not present

## 2022-11-09 DIAGNOSIS — Z0000 Encounter for general adult medical examination without abnormal findings: Secondary | ICD-10-CM | POA: Diagnosis not present

## 2022-11-09 DIAGNOSIS — E7800 Pure hypercholesterolemia, unspecified: Secondary | ICD-10-CM | POA: Diagnosis not present

## 2022-11-09 DIAGNOSIS — E039 Hypothyroidism, unspecified: Secondary | ICD-10-CM | POA: Diagnosis not present

## 2022-11-23 DIAGNOSIS — G4730 Sleep apnea, unspecified: Secondary | ICD-10-CM | POA: Diagnosis not present

## 2022-11-27 DIAGNOSIS — N3941 Urge incontinence: Secondary | ICD-10-CM | POA: Diagnosis not present

## 2022-11-27 DIAGNOSIS — G4733 Obstructive sleep apnea (adult) (pediatric): Secondary | ICD-10-CM | POA: Diagnosis not present

## 2022-11-27 DIAGNOSIS — K219 Gastro-esophageal reflux disease without esophagitis: Secondary | ICD-10-CM | POA: Diagnosis not present

## 2022-11-27 DIAGNOSIS — F329 Major depressive disorder, single episode, unspecified: Secondary | ICD-10-CM | POA: Diagnosis not present

## 2022-11-27 DIAGNOSIS — R269 Unspecified abnormalities of gait and mobility: Secondary | ICD-10-CM | POA: Diagnosis not present

## 2022-11-27 DIAGNOSIS — I129 Hypertensive chronic kidney disease with stage 1 through stage 4 chronic kidney disease, or unspecified chronic kidney disease: Secondary | ICD-10-CM | POA: Diagnosis not present

## 2022-11-27 DIAGNOSIS — E1151 Type 2 diabetes mellitus with diabetic peripheral angiopathy without gangrene: Secondary | ICD-10-CM | POA: Diagnosis not present

## 2022-11-27 DIAGNOSIS — E039 Hypothyroidism, unspecified: Secondary | ICD-10-CM | POA: Diagnosis not present

## 2022-11-27 DIAGNOSIS — M810 Age-related osteoporosis without current pathological fracture: Secondary | ICD-10-CM | POA: Diagnosis not present

## 2022-11-27 DIAGNOSIS — E785 Hyperlipidemia, unspecified: Secondary | ICD-10-CM | POA: Diagnosis not present

## 2022-11-27 DIAGNOSIS — G2581 Restless legs syndrome: Secondary | ICD-10-CM | POA: Diagnosis not present

## 2022-12-01 DIAGNOSIS — Z515 Encounter for palliative care: Secondary | ICD-10-CM | POA: Diagnosis not present

## 2022-12-01 DIAGNOSIS — I639 Cerebral infarction, unspecified: Secondary | ICD-10-CM | POA: Diagnosis not present

## 2023-01-04 DIAGNOSIS — K5730 Diverticulosis of large intestine without perforation or abscess without bleeding: Secondary | ICD-10-CM | POA: Diagnosis not present

## 2023-01-04 DIAGNOSIS — K439 Ventral hernia without obstruction or gangrene: Secondary | ICD-10-CM | POA: Diagnosis not present

## 2023-01-04 DIAGNOSIS — I700 Atherosclerosis of aorta: Secondary | ICD-10-CM | POA: Diagnosis not present

## 2023-01-04 DIAGNOSIS — K5750 Diverticulosis of both small and large intestine without perforation or abscess without bleeding: Secondary | ICD-10-CM | POA: Diagnosis not present

## 2023-01-04 DIAGNOSIS — N281 Cyst of kidney, acquired: Secondary | ICD-10-CM | POA: Diagnosis not present

## 2023-01-04 DIAGNOSIS — R1904 Left lower quadrant abdominal swelling, mass and lump: Secondary | ICD-10-CM | POA: Diagnosis not present

## 2023-01-12 DIAGNOSIS — C50911 Malignant neoplasm of unspecified site of right female breast: Secondary | ICD-10-CM | POA: Diagnosis not present

## 2023-01-25 DIAGNOSIS — G4733 Obstructive sleep apnea (adult) (pediatric): Secondary | ICD-10-CM | POA: Diagnosis not present

## 2023-01-28 ENCOUNTER — Other Ambulatory Visit: Payer: Self-pay | Admitting: *Deleted

## 2023-01-28 DIAGNOSIS — K469 Unspecified abdominal hernia without obstruction or gangrene: Secondary | ICD-10-CM

## 2023-02-03 ENCOUNTER — Encounter: Payer: Self-pay | Admitting: Surgery

## 2023-02-03 ENCOUNTER — Ambulatory Visit: Payer: Medicare HMO | Admitting: Surgery

## 2023-02-03 VITALS — BP 151/65 | HR 68 | Temp 98.2°F | Resp 16 | Ht 63.0 in | Wt 197.0 lb

## 2023-02-03 DIAGNOSIS — I2510 Atherosclerotic heart disease of native coronary artery without angina pectoris: Secondary | ICD-10-CM | POA: Diagnosis not present

## 2023-02-03 DIAGNOSIS — I10 Essential (primary) hypertension: Secondary | ICD-10-CM | POA: Diagnosis not present

## 2023-02-03 DIAGNOSIS — Z955 Presence of coronary angioplasty implant and graft: Secondary | ICD-10-CM | POA: Diagnosis not present

## 2023-02-03 DIAGNOSIS — K439 Ventral hernia without obstruction or gangrene: Secondary | ICD-10-CM

## 2023-02-03 DIAGNOSIS — I779 Disorder of arteries and arterioles, unspecified: Secondary | ICD-10-CM

## 2023-02-03 DIAGNOSIS — J4489 Other specified chronic obstructive pulmonary disease: Secondary | ICD-10-CM | POA: Diagnosis not present

## 2023-02-03 DIAGNOSIS — G4733 Obstructive sleep apnea (adult) (pediatric): Secondary | ICD-10-CM | POA: Diagnosis not present

## 2023-02-04 NOTE — Progress Notes (Signed)
[facility] History and Physical  Reason for Referral: Ventral hernia Referring Physician: Pershing Proud, FNP  Chief Complaint   New Patient (Initial Visit)     Michaela Wilcox is a 72 y.o. female.  HPI: Patient presents for evaluation of a ventral hernia.  She first noticed this hernia about a year ago and since then has been increasing in size.  It causes her mild pain.  She has never tried to reduce the area.  She does have issues with nausea, but attributes this to her reflux issues.  She denies any vomiting, fevers, and chills.  She is having regular bowel movements.  She has a significant past medical history that includes coronary artery disease on Plavix and aspirin and status post stent placement x 3, carotid artery disease, diabetes, GERD, hypertension, hyperlipidemia, and CVA.  She also has an extensive surgical history including an open appendectomy, laparoscopic cholecystectomy, and mixed oratory laparotomy in 2014 for small bowel obstruction.  She smokes a pack of cigarettes per day and denies use of oxygen.  She denies use of alcohol and illicit drugs.  She has not seen a cardiologist in over 10 years.  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   OSA on CPAP 04/02/2011   Peripheral vascular occlusive disease ([facility])    occluded left internal carotid and moderate right ICAstenosis   Thoracic compression fracture [facility])     Past Surgical History:  Procedure Laterality Date   APPENDECTOMY     BREAST SURGERY     CARDIAC CATHETERIZATION  2010   recath revealing non-critica CAD   CARDIAC  SURGERY     X3   carotid doppler  10/20/09   right bulb and ICA 50-69%,left CCA occluded   CHOLECYSTECTOMY     CORONARY ANGIOPLASTY  2009   s/p LAD AND CIRC STENT   LAPAROTOMY  04/07/2011   BOWEL OBSTRUCTIONProcedure: EXPLORATORY LAPAROTOMY;  Surgeon: Fabio Bering, MD;  Location: [facility];  Service: General;  Laterality: N/A;   MASTECTOMY Left 2005   MASTECTOMY MODIFIED RADICAL Right 08/13/2019   Procedure: Modified Radical Mastectomy;  Surgeon: Earline Mayotte, MD;  Location: [facility] ORS;  Service: General;  Laterality: Right;  right breast modified radical mastectomy   NM [place] PERF WALL MOT[place]  July 02, 2010   entirely normal   THYROID SURGERY     TUBAL LIGATION     WOUND DEBRIDEMENT Right 09/28/2019   Procedure: DEBRIDEMENT WOUND RIGHT MASTECTOMY SITE;  Surgeon: Earline Mayotte, MD;  Location: [facility] ORS;  Service: General;  Laterality: Right;    Family History  Problem Relation Age of Onset   Crohn's disease Sister    Breast cancer Neg Hx     Social History   Tobacco Use   Smoking status: Every Day    Current packs/day: 1.00    Average packs/day: 1 pack/day for 34.0 years (34.0 ttl pk-yrs)    Types: Cigarettes   Smokeless tobacco: Never   Tobacco comments:    currently  smoking less than a pack per day  Vaping Use   Vaping status: Never Used  Substance Use Topics   Alcohol use: No   Drug use: No    Medications: I have reviewed the patient's current medications. Allergies as of 02/03/2023   No Known Allergies      Medication List        Accurate as of February 03, 2023 11:59 PM. If you have any questions, ask your nurse or doctor.          STOP taking these medications    Advair Diskus 250-50 MCG/DOSE Aepb Generic drug: fluticasone-salmeterol Stopped by: Walida Cajas A Tonetta Napoles   albuterol (2.5 MG/3ML) 0.083% nebulizer solution Commonly known as: PROVENTIL Stopped by: Aodhan Scheidt A Nels Munn   Incruse Ellipta 62.5 MCG/INH Aepb Generic drug:  umeclidinium bromide Stopped by: Naevia Unterreiner A Kaitlyn Franko   letrozole 2.5 MG tablet Commonly known as: FEMARA Stopped by: Canaan Prue A Terril Chestnut       TAKE these medications    alendronate 70 MG tablet Commonly known as: FOSAMAX Take 1 tablet by mouth once a week.   aspirin EC 81 MG tablet Take 81 mg by mouth daily. Swallow whole.   atorvastatin 80 MG tablet Commonly known as: LIPITOR Take 80 mg by mouth at bedtime.   buPROPion 150 MG 24 hr tablet Commonly known as: WELLBUTRIN XL Take 150 mg by mouth every morning.   clopidogrel 75 MG tablet Commonly known as: PLAVIX Take 75 mg by mouth daily.   furosemide 20 MG tablet Commonly known as: LASIX Take 20 mg by mouth 2 (two) times daily.   gabapentin 300 MG capsule Commonly known as: NEURONTIN Take 300 mg by mouth in the morning and at bedtime.   isosorbide mononitrate 60 MG 24 hr tablet Commonly known as: IMDUR Take 60 mg by mouth every morning.   levothyroxine 150 MCG tablet Commonly known as: SYNTHROID Take 150 mcg by mouth daily before breakfast.   metoprolol succinate 25 MG 24 hr tablet Commonly known as: TOPROL-XL Take 25 mg by mouth daily.   omeprazole 20 MG tablet Commonly known as: PRILOSEC OTC Take 20 mg by mouth every morning.   Ozempic (0.25 or 0.5 MG/DOSE) 2 MG/3ML Sopn Generic drug: Semaglutide(0.25 or 0.5MG /DOS) Inject into the skin.   pioglitazone 45 MG tablet Commonly known as: ACTOS Take 45 mg by mouth daily.   pramipexole 0.25 MG tablet Commonly known as: MIRAPEX Take 0.25 mg by mouth at bedtime.   valsartan 40 MG tablet Commonly known as: DIOVAN Take 40 mg by mouth daily.   Vitamin D 125 MCG (5000 UT) Caps Take 5,000 Units by mouth daily.         ROS:  Constitutional: negative for chills, fatigue, and fevers Eyes: negative for visual disturbance and pain Ears, nose, mouth, throat, and face: negative for ear drainage, sore throat, and sinus problems Respiratory: negative  for cough, wheezing, and shortness of breath Cardiovascular: negative for chest pain and palpitations Gastrointestinal: positive for abdominal pain and nausea, negative for reflux symptoms and vomiting Genitourinary:negative for dysuria and frequency Integument/breast: positive for dryness, negative for rash Hematologic/lymphatic: positive for lymphadenopathy, negative for bleeding Musculoskeletal:positive for back pain Neurological: positive for dizziness, negative for tremors Endocrine: negative for temperature intolerance  Blood pressure (!) 151/65, pulse 68, temperature 98.2 F (36.8 C), temperature source Oral, resp. rate 16, height 5\' 3"  (1.6 m), weight 197 lb (89.4 kg), SpO2 96%. Physical Exam Vitals reviewed.  Constitutional:      Appearance:  Normal appearance.  HENT:     Head: Normocephalic and atraumatic.  Eyes:     Extraocular Movements: Extraocular movements intact.     Pupils: Pupils are equal, round, and reactive to light.  Cardiovascular:     Rate and Rhythm: Normal rate and regular rhythm.  Pulmonary:     Effort: Pulmonary effort is normal.     Breath sounds: Normal breath sounds.  Abdominal:     Comments: Abdomen soft, nondistended, no percussion tenderness, nontender to palpation; no rigidity, guarding, rebound tenderness; large bulge in the supraumbilical area, able to be reduced, though unable to palpate fascial defect  Musculoskeletal:        General: Normal range of motion.     Cervical back: Normal range of motion.  Skin:    General: Skin is warm and dry.  Neurological:     General: No focal deficit present.     Mental Status: She is alert and oriented to person, place, and time.  Psychiatric:        Mood and Affect: Mood normal.        Behavior: Behavior normal.     Results: CT of the abdomen and pelvis (01/04/2023): Impression: 1.  Fat-containing supraumbilical hernia. 2.  No acute abnormality identified in the abdomen and pelvis 3.  Colonic  diverticulosis. 4.  Aortic atherosclerosis   Assessment & Plan:  Michaela Wilcox is a 72 y.o. female who presents for evaluation of a ventral hernia.  -We discussed the pathophysiology of hernias, and why we recommend surgical repair. -Given the patient's significant medical comorbidities, I would like her to see a cardiologist and pulmonologist prior to scheduling her for any surgeries.   -I also would like to evaluate her imaging.  It was performed at [facility], so I am unable to personally view the images, but I will review them with the radiologist -I discussed with the patient that we can discuss our surgical options once I receive surgical risk stratification from cardiology  All questions were answered to the satisfaction of the patient.  Theophilus Kinds, DO [facility] [address] [place], [place] [address] [phone] (office)

## 2023-02-08 ENCOUNTER — Telehealth: Payer: Self-pay | Admitting: *Deleted

## 2023-02-08 NOTE — Telephone Encounter (Signed)
Received call from patient [phone]~ telephone.   Patient reports that she attempted to schedule appointment with [facility] [phone] telephone. States that she was not able to schedule an appointment as pulmonology requires sleep study prior to appointment. States that she recently had a home sleep study that was ordered by PCP in September 2024. Reports that study results were sent to [facility] and new CPAP machine was obtained. Patient reports that she contacted her insurance and was advised that no additional sleep study would be covered as it is too soon to repeat.   Call placed to [facility] [phone]~ telephone.  Was advised that [place] office is scheduling new patients out to January 2025. Advised that recent sleep study from PCP is acceptable.   Call placed to patient and patient made aware. Also advised that cardiology referral is still pending. No surgery will be scheduled until clearances are received.   Patient verbalized understanding.

## 2023-02-13 ENCOUNTER — Other Ambulatory Visit: Payer: Self-pay | Admitting: Medical Genetics

## 2023-02-13 DIAGNOSIS — Z006 Encounter for examination for normal comparison and control in clinical research program: Secondary | ICD-10-CM

## 2023-02-15 ENCOUNTER — Other Ambulatory Visit (HOSPITAL_COMMUNITY)
Admission: RE | Admit: 2023-02-15 | Discharge: 2023-02-15 | Disposition: A | Discharge status: Home / Self Care | Payer: Medicare HMO | Source: Ambulatory Visit | Attending: Medical Genetics | Admitting: Medical Genetics

## 2023-02-15 DIAGNOSIS — Z006 Encounter for examination for normal comparison and control in clinical research program: Secondary | ICD-10-CM | POA: Insufficient documentation

## 2023-02-23 DIAGNOSIS — G4733 Obstructive sleep apnea (adult) (pediatric): Secondary | ICD-10-CM | POA: Diagnosis not present

## 2023-02-23 LAB — HELIX MOLECULAR SCREEN: Genetic Analysis Overall Interpretation: NEGATIVE

## 2023-02-23 LAB — GENECONNECT MOLECULAR SCREEN

## 2023-02-25 DIAGNOSIS — G4733 Obstructive sleep apnea (adult) (pediatric): Secondary | ICD-10-CM | POA: Diagnosis not present

## 2023-03-18 DIAGNOSIS — G4733 Obstructive sleep apnea (adult) (pediatric): Secondary | ICD-10-CM | POA: Diagnosis not present

## 2023-03-27 DIAGNOSIS — G4733 Obstructive sleep apnea (adult) (pediatric): Secondary | ICD-10-CM | POA: Diagnosis not present

## 2023-03-28 ENCOUNTER — Other Ambulatory Visit (INDEPENDENT_AMBULATORY_CARE_PROVIDER_SITE_OTHER): Payer: Self-pay | Admitting: Vascular Surgery

## 2023-03-28 DIAGNOSIS — I6523 Occlusion and stenosis of bilateral carotid arteries: Secondary | ICD-10-CM

## 2023-03-29 ENCOUNTER — Encounter (INDEPENDENT_AMBULATORY_CARE_PROVIDER_SITE_OTHER): Payer: Self-pay | Admitting: Vascular Surgery

## 2023-03-29 ENCOUNTER — Ambulatory Visit (INDEPENDENT_AMBULATORY_CARE_PROVIDER_SITE_OTHER): Payer: Medicare HMO

## 2023-03-29 ENCOUNTER — Ambulatory Visit (INDEPENDENT_AMBULATORY_CARE_PROVIDER_SITE_OTHER): Payer: Medicare HMO | Admitting: Vascular Surgery

## 2023-03-29 VITALS — BP 133/72 | HR 77 | Ht 59.0 in | Wt 196.6 lb

## 2023-03-29 DIAGNOSIS — E785 Hyperlipidemia, unspecified: Secondary | ICD-10-CM | POA: Diagnosis not present

## 2023-03-29 DIAGNOSIS — E119 Type 2 diabetes mellitus without complications: Secondary | ICD-10-CM

## 2023-03-29 DIAGNOSIS — I779 Disorder of arteries and arterioles, unspecified: Secondary | ICD-10-CM

## 2023-03-29 DIAGNOSIS — I6523 Occlusion and stenosis of bilateral carotid arteries: Secondary | ICD-10-CM | POA: Diagnosis not present

## 2023-03-29 DIAGNOSIS — I10 Essential (primary) hypertension: Secondary | ICD-10-CM | POA: Diagnosis not present

## 2023-03-29 NOTE — Progress Notes (Signed)
MRN : [id number]  Michaela Wilcox is a 72 y.o. (Jul 13, 1950) female who presents with chief complaint of  Chief Complaint  Patient presents with   Follow-up    f/u in 1 year with carotid  .  History of Present Illness: Patient returns in follow-up of her carotid disease.  She is doing well today without any specific complaints.  No focal neurologic symptoms. Specifically, the patient denies amaurosis fugax, speech or swallowing difficulties, or arm or leg weakness or numbness.  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  Current Outpatient Medications  Medication Sig Dispense Refill   alendronate (FOSAMAX) 70 MG tablet Take 1 tablet by mouth once a week.     aspirin 81 MG EC tablet Take 81 mg by mouth daily. Swallow whole.     atorvastatin (LIPITOR) 80 MG tablet Take 80 mg by mouth at bedtime.      buPROPion (WELLBUTRIN XL) 150 MG 24 hr tablet Take 150 mg by mouth every morning.      Cholecalciferol (VITAMIN D) 125 MCG (5000 UT) CAPS Take 5,000 Units by mouth daily.     clopidogrel (PLAVIX) 75 MG tablet Take 75 mg by mouth daily.      furosemide (LASIX) 20 MG tablet Take 20 mg by mouth 2 (two) times daily.      gabapentin (NEURONTIN) 300 MG capsule Take 300 mg by mouth in the morning and at bedtime.     isosorbide mononitrate (IMDUR) 60 MG 24 hr tablet Take 60 mg by mouth every morning.      levothyroxine (SYNTHROID, LEVOTHROID) 150 MCG tablet Take 150 mcg by mouth daily before breakfast.      metoprolol succinate (TOPROL-XL) 25 MG 24 hr tablet Take 25 mg by mouth daily.     omeprazole (PRILOSEC OTC) 20 MG tablet Take 20 mg by mouth every morning.     OZEMPIC, 0.25 OR 0.5 MG/DOSE, 2 MG/3ML SOPN Inject into the skin.     pioglitazone (ACTOS) 45 MG tablet Take 45 mg by mouth daily.      pramipexole (MIRAPEX) 0.25 MG tablet Take 0.25 mg by mouth at  bedtime.      valsartan (DIOVAN) 40 MG tablet Take 40 mg by mouth daily.     No current facility-administered medications for this visit.    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   OSA on CPAP 04/02/2011   Peripheral vascular occlusive disease (HCC)    occluded left internal carotid and moderate right ICAstenosis   Thoracic compression fracture [facility])     Past Surgical History:  Procedure Laterality Date   APPENDECTOMY     BREAST SURGERY     CARDIAC CATHETERIZATION  2010   recath revealing non-critica CAD   CARDIAC SURGERY     X3   carotid doppler  10/20/09   right bulb and ICA 50-69%,left CCA occluded   CHOLECYSTECTOMY  CORONARY ANGIOPLASTY  2009   s/p LAD AND CIRC STENT   LAPAROTOMY  04/07/2011   BOWEL OBSTRUCTIONProcedure: EXPLORATORY LAPAROTOMY;  Surgeon: Fabio Bering, MD;  Location: [facility];  Service: General;  Laterality: N/A;   MASTECTOMY Left 2005   MASTECTOMY MODIFIED RADICAL Right 08/13/2019   Procedure: Modified Radical Mastectomy;  Surgeon: Earline Mayotte, MD;  Location: [facility] ORS;  Service: General;  Laterality: Right;  right breast modified radical mastectomy   NM MYOCAR PERF WALL MOTION  July 02, 2010   entirely normal   THYROID SURGERY     TUBAL LIGATION     WOUND DEBRIDEMENT Right 09/28/2019   Procedure: DEBRIDEMENT WOUND RIGHT MASTECTOMY SITE;  Surgeon: Earline Mayotte, MD;  Location: [facility] ORS;  Service: General;  Laterality: Right;     Social History   Tobacco Use   Smoking status: Every Day    Current packs/day: 1.00    Average packs/day: 1 pack/day for 34.0 years  (34.0 ttl pk-yrs)    Types: Cigarettes   Smokeless tobacco: Never   Tobacco comments:    currently smoking less than a pack per day  Vaping Use   Vaping status: Never Used  Substance Use Topics   Alcohol use: No   Drug use: No       Family History  Problem Relation Age of Onset   Crohn's disease Sister    Breast cancer Neg Hx      No Known Allergies  REVIEW OF SYSTEMS (Negative unless checked)   Constitutional: [] Weight loss  [] Fever  [] Chills Cardiac: [] Chest pain   [] Chest pressure   [] Palpitations   [] Shortness of breath when laying flat   [] Shortness of breath at rest   [x] Shortness of breath with exertion. Vascular:  [] Pain in legs with walking   [] Pain in legs at rest   [] Pain in legs when laying flat   [] Claudication   [] Pain in feet when walking  [] Pain in feet at rest  [] Pain in feet when laying flat   [] History of DVT   [] Phlebitis   [] Swelling in legs   [] Varicose veins   [] Non-healing ulcers Pulmonary:   [] Uses home oxygen   [] Productive cough   [] Hemoptysis   [x] Wheeze  [x] COPD   [] Asthma Neurologic:  [x] Dizziness  [] Blackouts   [] Seizures   [] History of stroke   [] History of TIA  [] Aphasia   [] Temporary blindness   [] Dysphagia   [] Weakness or numbness in arms   [] Weakness or numbness in legs Musculoskeletal:  [x] Arthritis   [] Joint swelling   [] Joint pain   [] Low back pain Hematologic:  [] Easy bruising  [] Easy bleeding   [] Hypercoagulable state   [] Anemic  [] Hepatitis Gastrointestinal:  [] Blood in stool   [] Vomiting blood  [x] Gastroesophageal reflux/heartburn   [] Abdominal pain Genitourinary:  [] Chronic kidney disease   [] Difficult urination  [] Frequent urination  [] Burning with urination   [] Hematuria Skin:  [] Rashes   [] Ulcers   [] Wounds Psychological:  [] History of anxiety   []  History of major depression.  Physical Examination  Vitals:   03/29/23 1319  BP: 133/72  Pulse: 77  Weight: 196 lb 9.6 oz (89.2 kg)  Height: 4\' 11"  (1.499 m)   Body mass index is  39.71 kg/m. Gen:  WD/WN, NAD Head: [place]/AT, No temporalis wasting. Ear/Nose/Throat: Hearing grossly intact, nares w/o erythema or drainage, trachea midline Eyes: Conjunctiva clear. Sclera non-icteric Neck: Supple.  No bruit  Pulmonary:  Good air movement, equal and clear to auscultation bilaterally.  Cardiac: RRR, No JVD Vascular:  Vessel Right Left  Radial Palpable Palpable               Musculoskeletal: M/S 5/5 throughout.  No deformity or atrophy. Trace LE edema. Neurologic: CN 2-12 intact. Sensation grossly intact in extremities.  Symmetrical.  Speech is fluent. Motor exam as listed above. Psychiatric: Judgment intact, Mood & affect appropriate for pt's clinical situation. Dermatologic: No rashes or ulcers noted.  No cellulitis or open wounds.    CBC Lab Results  Component Value Date   WBC 5.1 10/30/2020   HGB 12.3 10/30/2020   HCT 38.5 10/30/2020   MCV 99.5 10/30/2020   PLT 240 10/30/2020    BMET    Component Value Date/Time   NA 140 10/30/2020 0947   K 3.8 10/30/2020 0947   CL 104 10/30/2020 0947   CO2 28 10/30/2020 0947   GLUCOSE 242 (H) 10/30/2020 0947   BUN 18 10/30/2020 0947   CREATININE 0.70 10/30/2020 0947   CALCIUM 8.8 (L) 10/30/2020 0947   GFRNONAA >60 10/30/2020 0947   GFRAA >60 08/01/2019 1016   CrCl cannot be calculated (Patient's most recent lab result is older than the maximum 21 days allowed.).  COAG Lab Results  Component Value Date   INR 1.04 04/01/2011    Radiology No results found.   Assessment/Plan HTN (hypertension) blood pressure control important in reducing the progression of atherosclerotic disease. On appropriate oral medications.     Hyperlipidemia lipid control important in reducing the progression of atherosclerotic disease. Continue statin therapy     DM type 2 (diabetes mellitus, type 2) blood glucose control important in reducing the progression of atherosclerotic disease. Also, involved in wound healing. On  appropriate medications.   Carotid artery disease (HCC) Carotid duplex today reveals mild, 1 to 39% right ICA stenosis which is stable.  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  This is stable from her previous studies.  This was unlikely the cause of her previous right hemispheric stroke.  No role for intervention.  Recheck in 1 year.  No change in medical regimen which includes dual antiplatelet therapy and high-dose Lipitor.   Festus Barren, MD  03/29/2023 3:36 PM    This note was created with Dragon medical transcription system.  Any errors from dictation are purely unintentional

## 2023-04-26 ENCOUNTER — Ambulatory Visit: Payer: Medicare HMO | Attending: Internal Medicine | Admitting: Internal Medicine

## 2023-04-26 ENCOUNTER — Encounter: Payer: Self-pay | Admitting: Internal Medicine

## 2023-04-26 VITALS — BP 140/62 | HR 75 | Ht 59.0 in | Wt 206.0 lb

## 2023-04-26 DIAGNOSIS — I352 Nonrheumatic aortic (valve) stenosis with insufficiency: Secondary | ICD-10-CM | POA: Insufficient documentation

## 2023-04-26 DIAGNOSIS — I351 Nonrheumatic aortic (valve) insufficiency: Secondary | ICD-10-CM

## 2023-04-26 DIAGNOSIS — Z136 Encounter for screening for cardiovascular disorders: Secondary | ICD-10-CM

## 2023-04-26 DIAGNOSIS — I350 Nonrheumatic aortic (valve) stenosis: Secondary | ICD-10-CM | POA: Diagnosis not present

## 2023-04-26 DIAGNOSIS — Z7902 Long term (current) use of antithrombotics/antiplatelets: Secondary | ICD-10-CM | POA: Diagnosis not present

## 2023-04-26 NOTE — Patient Instructions (Signed)
 Medication Instructions:  Your physician has recommended you make the following change in your medication:  Stop taking Aspirin  Continue taking all other medications as prescribed  Labwork: None  Testing/Procedures: Your physician has requested that you have an echocardiogram. Echocardiography is a painless test that uses sound waves to create images of your heart. It provides your doctor with information about the size and shape of your heart and how well your heart's chambers and valves are working. This procedure takes approximately one hour. There are no restrictions for this procedure. Please do NOT wear cologne, perfume, aftershave, or lotions (deodorant is allowed). Please arrive 15 minutes prior to your appointment time.  Please note: We ask at that you not bring children with you during ultrasound (echo/ vascular) testing. Due to room size and safety concerns, children are not allowed in the ultrasound rooms during exams. Our front office staff cannot provide observation of children in our lobby area while testing is being conducted. An adult accompanying a patient to their appointment will only be allowed in the ultrasound room at the discretion of the ultrasound technician under special circumstances. We apologize for any inconvenience.   Follow-Up: Your physician recommends that you schedule a follow-up appointment in: 6 months  Any Other Special Instructions Will Be Listed Below (If Applicable). Thank you for choosing [facility]!      If you need a refill on your cardiac medications before your next appointment, please call your pharmacy.

## 2023-04-26 NOTE — Progress Notes (Signed)
 Cardiology Office Note  Date: 04/26/2023   ID: Michaela Wilcox, DOB Aug 30, 1950, MRN [id number]  PCP:  Leavy Waddell NOVAK, FNP  Cardiologist:  None Electrophysiologist:  None   History of Present Illness: Michaela Wilcox is a 73 y.o. female known to have CAD s/p LAD and OM 2 PCI in 2016 with residual distal RCA 70% stenosis and 30% LAD ISR, valvular heart disease (mild to moderate aortic valve stenosis and mild aortic regurgitation) in 2023, HTN, DM 2, HLD, OSA on CPAP was referred to cardiology clinic to establish care.  Overall doing great, no symptoms.  No DOE, orthopnea, PND, angina.  She reported having pain at the base of her neck prior to her LAD PCI.  She says she put this off for 2 years before she was told she had to get a stent.  She does have chronic bilateral lower EXTR swelling.  No syncope, dizziness or palpitations.  Compliant with medications and has no side effects.  She is currently not taking baby aspirin.  Only taking Plavix.  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   OSA on CPAP 04/02/2011   Peripheral vascular occlusive disease (HCC)    occluded left internal carotid and moderate right ICAstenosis   Thoracic compression fracture [facility])     Past Surgical History:  Procedure Laterality Date   APPENDECTOMY     BREAST SURGERY     CARDIAC CATHETERIZATION  2010   recath revealing non-critica CAD   CARDIAC SURGERY     X3   carotid doppler  10/20/09   right bulb and ICA 50-69%,left CCA occluded   CHOLECYSTECTOMY     CORONARY ANGIOPLASTY  2009   s/p LAD AND CIRC STENT   LAPAROTOMY  04/07/2011   BOWEL  OBSTRUCTIONProcedure: EXPLORATORY LAPAROTOMY;  Surgeon: Thresa JAYSON Pulling, MD;  Location: [facility];  Service: General;  Laterality: N/A;   MASTECTOMY Left 2005   MASTECTOMY MODIFIED RADICAL Right 08/13/2019   Procedure: Modified Radical Mastectomy;  Surgeon: Dessa Reyes ORN, MD;  Location: [facility] OR[facility];  Service: General;  Laterality: Right;  right breast modified radical mastectomy   NM MYOCAR PERF WALL MOTION  July 02, 2010   entirely normal   THYROID  SURGERY     TUBAL LIGATION     WOUND DEBRIDEMENT Right 09/28/2019   Procedure: DEBRIDEMENT WOUND RIGHT MASTECTOMY SITE;  Surgeon: Dessa Reyes ORN, MD;  Location: [facility] ORS;  Service: General;  Laterality: Right;    Current Outpatient Medications  Medication Sig Dispense Refill   alendronate (FOSAMAX) 70 MG tablet Take 1 tablet by mouth once a week.     aspirin 81 MG EC tablet Take 81 mg by mouth daily. Swallow whole.     atorvastatin (LIPITOR) 80 MG tablet Take 80 mg by mouth at bedtime.      buPROPion (WELLBUTRIN XL) 150 MG 24 hr tablet Take 150 mg by mouth every morning.      Cholecalciferol (VITAMIN D) 125 MCG (5000 UT) CAPS Take 5,000 Units  by mouth daily.     clopidogrel (PLAVIX) 75 MG tablet Take 75 mg by mouth daily.      furosemide (LASIX) 20 MG tablet Take 20 mg by mouth 2 (two) times daily.      gabapentin (NEURONTIN) 300 MG capsule Take 300 mg by mouth in the morning and at bedtime.     isosorbide mononitrate (IMDUR) 60 MG 24 hr tablet Take 60 mg by mouth every morning.      levothyroxine  (SYNTHROID , LEVOTHROID) 150 MCG tablet Take 150 mcg by mouth daily before breakfast.      metoprolol  succinate (TOPROL -XL) 25 MG 24 hr tablet Take 25 mg by mouth daily.     omeprazole (PRILOSEC OTC) 20 MG tablet Take 20 mg by mouth every morning.     OZEMPIC, 0.25 OR 0.5 MG/DOSE, 2 MG/3ML SOPN Inject into the skin.     pioglitazone (ACTOS) 45 MG tablet Take 45 mg by mouth daily.      pramipexole (MIRAPEX) 0.25 MG tablet Take 0.25 mg by mouth at  bedtime.      valsartan (DIOVAN) 40 MG tablet Take 40 mg by mouth daily.     No current facility-administered medications for this visit.   Allergies:  Patient has no known allergies.   Social History: The patient  reports that she quit smoking about 3 weeks ago. Her smoking use included cigarettes. She started smoking 13 days ago. She has a 34 pack-year smoking history. She has never used smokeless tobacco. She reports that she does not drink alcohol and does not use drugs.   Family History: The patient's family history includes Crohn's disease in her sister.   ROS:  Please see the history of present illness. Otherwise, complete review of systems is positive for none  All other systems are reviewed and negative.   Physical Exam: VS:  BP (!) 140/62   Pulse 75   Ht 4' 11 (1.499 m)   Wt 206 lb (93.4 kg)   SpO2 98%   BMI 41.61 kg/m , BMI Body mass index is 41.61 kg/m.  Wt Readings from Last 3 Encounters:  04/26/23 206 lb (93.4 kg)  03/29/23 196 lb 9.6 oz (89.2 kg)  02/03/23 197 lb (89.4 kg)    General: Patient appears comfortable at rest. HEENT: Conjunctiva and lids normal, oropharynx clear with moist mucosa. Neck: Supple, no elevated JVP or carotid bruits, no thyromegaly. Lungs: Clear to auscultation, nonlabored breathing at rest. Cardiac: Regular rate and rhythm, no S3 or significant systolic murmur, no pericardial rub. Abdomen: Soft, nontender, no hepatomegaly, bowel sounds present, no guarding or rebound. Extremities: No pitting edema, distal pulses 2+. Skin: Warm and dry. Musculoskeletal: No kyphosis. Neuropsychiatric: Alert and oriented x3, affect grossly appropriate.  Recent Labwork: No results found for requested labs within last 365 days.  No results found for: CHOL, TRIG, HDL, CHOLHDL, VLDL, LDLCALC, LDLDIRECT   Assessment and Plan:  CAD s/p LAD and OM 2 PCI (With residual 70% distal RCA and 30% LAD ISR on 2016 LHC) Valvular heart disease (mild to  moderate aortic valve stenosis and mild aortic regurgitation) in 11/2021 OSA on CPAP HTN, DM 2, HLD Nicotine abuse (Quit smoking 3 weeks ago)   -No interval angina. Discontinue aspirin 81 mg and continue Plavix 75 mg once daily. Continue atorvastatin 80 mg nightly. Goal LDL less than 70. Can follow-up with PCP for lipid panel. -Compensated, asymptomatic. Continue p.o. Lasix 20 mg twice daily. Echocardiogram showed normal LVEF with normal RA pressure, G1 DD,  mild to moderate aortic valve stenosis and mild aortic regurgitation.  Will repeat echocardiogram for surveillance of aortic valve lesions. -Continue current antihypertensives, Imdur 60 mg once daily, metoprolol  succinate 25 mg once daily, valsartan 40 mg once daily. -Continue CPAP for OSA management.       Medication Adjustments/Labs and Tests Ordered: Current medicines are reviewed at length with the patient today.  Concerns regarding medicines are outlined above.    Disposition:  Follow up  6 months  Signed Shahed Yeoman Priya Devereaux Grayson, MD, 04/26/2023 2:37 PM    [facility] [address] [place], [place], [place] [address]

## 2023-04-27 DIAGNOSIS — G4733 Obstructive sleep apnea (adult) (pediatric): Secondary | ICD-10-CM | POA: Diagnosis not present

## 2023-05-04 ENCOUNTER — Ambulatory Visit: Payer: Medicare HMO | Attending: Internal Medicine

## 2023-05-04 DIAGNOSIS — I350 Nonrheumatic aortic (valve) stenosis: Secondary | ICD-10-CM | POA: Diagnosis not present

## 2023-05-04 DIAGNOSIS — I351 Nonrheumatic aortic (valve) insufficiency: Secondary | ICD-10-CM | POA: Diagnosis not present

## 2023-05-05 LAB — ECHOCARDIOGRAM COMPLETE
AR max vel: 1.57 cm2
AV Area VTI: 1.62 cm2
AV Area mean vel: 1.56 cm2
AV Mean grad: 14 mm[Hg]
AV Peak grad: 26.6 mm[Hg]
AV Vena cont: 0.9 cm
Ao pk vel: 2.58 m/s
Area-P 1/2: 3.83 cm2
Calc EF: 60.4 %
P 1/2 time: 330 ms
S' Lateral: 2.9 cm
Single Plane A2C EF: 56.2 %
Single Plane A4C EF: 63.6 %

## 2023-05-28 DIAGNOSIS — G4733 Obstructive sleep apnea (adult) (pediatric): Secondary | ICD-10-CM | POA: Diagnosis not present

## 2023-06-25 DIAGNOSIS — G4733 Obstructive sleep apnea (adult) (pediatric): Secondary | ICD-10-CM | POA: Diagnosis not present

## 2023-07-26 DIAGNOSIS — G4733 Obstructive sleep apnea (adult) (pediatric): Secondary | ICD-10-CM | POA: Diagnosis not present

## 2023-08-25 DIAGNOSIS — G4733 Obstructive sleep apnea (adult) (pediatric): Secondary | ICD-10-CM | POA: Diagnosis not present

## 2023-08-30 ENCOUNTER — Encounter (INDEPENDENT_AMBULATORY_CARE_PROVIDER_SITE_OTHER): Payer: Self-pay

## 2023-09-25 DIAGNOSIS — G4733 Obstructive sleep apnea (adult) (pediatric): Secondary | ICD-10-CM | POA: Diagnosis not present

## 2023-10-25 DIAGNOSIS — G4733 Obstructive sleep apnea (adult) (pediatric): Secondary | ICD-10-CM | POA: Diagnosis not present

## 2023-10-26 ENCOUNTER — Telehealth: Payer: Self-pay | Admitting: *Deleted

## 2023-10-26 NOTE — Telephone Encounter (Signed)
 Received call from [facility] with [facility] [phone] telephone/ [phone]~ fax.   Reports that Risk Stratification Form will be needed to complete for patient.   Call placed to PCP. Advised that Dr. Evonnie is requesting clearance and risk stratification from cardiology and pulmonology. Advised that patient did see cardiology and followed up with their plan for echocardiogram. Advised that patient has not consulted pulmonology at this time.   Advised that patient will also require OV with provider as last visit >6 months prior.   Carolanne verbalized understanding and states that she will discuss with patient.

## 2023-10-27 DIAGNOSIS — G4733 Obstructive sleep apnea (adult) (pediatric): Secondary | ICD-10-CM | POA: Diagnosis not present

## 2024-01-23 DIAGNOSIS — E1165 Type 2 diabetes mellitus with hyperglycemia: Secondary | ICD-10-CM | POA: Diagnosis not present

## 2024-01-25 DIAGNOSIS — G4733 Obstructive sleep apnea (adult) (pediatric): Secondary | ICD-10-CM | POA: Diagnosis not present

## 2024-02-01 DIAGNOSIS — H5213 Myopia, bilateral: Secondary | ICD-10-CM | POA: Diagnosis not present

## 2024-02-01 DIAGNOSIS — H52223 Regular astigmatism, bilateral: Secondary | ICD-10-CM | POA: Diagnosis not present

## 2024-02-01 DIAGNOSIS — Z961 Presence of intraocular lens: Secondary | ICD-10-CM | POA: Diagnosis not present

## 2024-02-01 DIAGNOSIS — H524 Presbyopia: Secondary | ICD-10-CM | POA: Diagnosis not present

## 2024-02-01 DIAGNOSIS — E119 Type 2 diabetes mellitus without complications: Secondary | ICD-10-CM | POA: Diagnosis not present

## 2024-02-01 DIAGNOSIS — Z9849 Cataract extraction status, unspecified eye: Secondary | ICD-10-CM | POA: Diagnosis not present

## 2024-03-15 ENCOUNTER — Other Ambulatory Visit (INDEPENDENT_AMBULATORY_CARE_PROVIDER_SITE_OTHER): Payer: Self-pay | Admitting: Vascular Surgery

## 2024-03-15 DIAGNOSIS — I6523 Occlusion and stenosis of bilateral carotid arteries: Secondary | ICD-10-CM

## 2024-03-27 ENCOUNTER — Ambulatory Visit (INDEPENDENT_AMBULATORY_CARE_PROVIDER_SITE_OTHER): Payer: Medicare HMO | Admitting: Vascular Surgery

## 2024-03-27 ENCOUNTER — Encounter (INDEPENDENT_AMBULATORY_CARE_PROVIDER_SITE_OTHER): Payer: Medicare HMO

## 2024-04-19 ENCOUNTER — Ambulatory Visit: Attending: Internal Medicine | Admitting: Internal Medicine

## 2024-04-19 NOTE — Progress Notes (Signed)
 Erroneous encounter - please disregard.

## 2024-04-20 ENCOUNTER — Encounter (INDEPENDENT_AMBULATORY_CARE_PROVIDER_SITE_OTHER)

## 2024-04-20 ENCOUNTER — Ambulatory Visit (INDEPENDENT_AMBULATORY_CARE_PROVIDER_SITE_OTHER): Admitting: Vascular Surgery

## 2024-04-23 ENCOUNTER — Encounter (INDEPENDENT_AMBULATORY_CARE_PROVIDER_SITE_OTHER)

## 2024-04-23 ENCOUNTER — Ambulatory Visit (INDEPENDENT_AMBULATORY_CARE_PROVIDER_SITE_OTHER): Admitting: Nurse Practitioner
